# Patient Record
Sex: Male | Born: 1955 | Race: Black or African American | Hispanic: No | Marital: Single | Smoking: Former smoker
Health system: Southern US, Community
[De-identification: ages and names within clinical notes are randomized; demographics above are authoritative.]

## PROBLEM LIST (undated history)

## (undated) DIAGNOSIS — R51 Headache: Secondary | ICD-10-CM

## (undated) DIAGNOSIS — R0600 Dyspnea, unspecified: Secondary | ICD-10-CM

## (undated) DIAGNOSIS — K635 Polyp of colon: Secondary | ICD-10-CM

## (undated) DIAGNOSIS — E119 Type 2 diabetes mellitus without complications: Secondary | ICD-10-CM

## (undated) DIAGNOSIS — F329 Major depressive disorder, single episode, unspecified: Secondary | ICD-10-CM

## (undated) DIAGNOSIS — R350 Frequency of micturition: Secondary | ICD-10-CM

## (undated) DIAGNOSIS — R112 Nausea with vomiting, unspecified: Secondary | ICD-10-CM

## (undated) DIAGNOSIS — R351 Nocturia: Secondary | ICD-10-CM

## (undated) DIAGNOSIS — S31119A Laceration without foreign body of abdominal wall, unspecified quadrant without penetration into peritoneal cavity, initial encounter: Secondary | ICD-10-CM

## (undated) DIAGNOSIS — F32A Depression, unspecified: Secondary | ICD-10-CM

## (undated) DIAGNOSIS — R519 Headache, unspecified: Secondary | ICD-10-CM

## (undated) DIAGNOSIS — J189 Pneumonia, unspecified organism: Secondary | ICD-10-CM

## (undated) DIAGNOSIS — U071 COVID-19: Secondary | ICD-10-CM

## (undated) DIAGNOSIS — R7303 Prediabetes: Secondary | ICD-10-CM

## (undated) DIAGNOSIS — M5020 Other cervical disc displacement, unspecified cervical region: Secondary | ICD-10-CM

## (undated) DIAGNOSIS — K122 Cellulitis and abscess of mouth: Secondary | ICD-10-CM

## (undated) DIAGNOSIS — K219 Gastro-esophageal reflux disease without esophagitis: Secondary | ICD-10-CM

## (undated) DIAGNOSIS — I10 Essential (primary) hypertension: Secondary | ICD-10-CM

## (undated) DIAGNOSIS — Z8709 Personal history of other diseases of the respiratory system: Secondary | ICD-10-CM

## (undated) DIAGNOSIS — R413 Other amnesia: Secondary | ICD-10-CM

## (undated) DIAGNOSIS — R202 Paresthesia of skin: Secondary | ICD-10-CM

## (undated) DIAGNOSIS — F419 Anxiety disorder, unspecified: Secondary | ICD-10-CM

## (undated) DIAGNOSIS — K439 Ventral hernia without obstruction or gangrene: Secondary | ICD-10-CM

## (undated) DIAGNOSIS — H538 Other visual disturbances: Secondary | ICD-10-CM

## (undated) DIAGNOSIS — M1990 Unspecified osteoarthritis, unspecified site: Secondary | ICD-10-CM

## (undated) DIAGNOSIS — R200 Anesthesia of skin: Secondary | ICD-10-CM

## (undated) DIAGNOSIS — E669 Obesity, unspecified: Secondary | ICD-10-CM

## (undated) DIAGNOSIS — Z800 Family history of malignant neoplasm of digestive organs: Secondary | ICD-10-CM

## (undated) HISTORY — PX: NERVE REPAIR: SHX2083

## (undated) HISTORY — DX: Gastro-esophageal reflux disease without esophagitis: K21.9

## (undated) HISTORY — PX: OTHER SURGICAL HISTORY: SHX169

## (undated) HISTORY — DX: Polyp of colon: K63.5

## (undated) HISTORY — PX: COLONOSCOPY: SHX174

## (undated) HISTORY — PX: LEG SURGERY: SHX1003

## (undated) HISTORY — DX: Unspecified osteoarthritis, unspecified site: M19.90

## (undated) HISTORY — PX: APPENDECTOMY: SHX54

## (undated) HISTORY — DX: Headache, unspecified: R51.9

## (undated) HISTORY — PX: POLYPECTOMY: SHX149

## (undated) HISTORY — DX: Cellulitis and abscess of mouth: K12.2

## (undated) HISTORY — DX: Headache: R51

## (undated) HISTORY — PX: SHOULDER ARTHROSCOPY: SHX128

## (undated) HISTORY — DX: Other amnesia: R41.3

## (undated) HISTORY — DX: Nausea with vomiting, unspecified: R11.2

## (undated) HISTORY — DX: Family history of malignant neoplasm of digestive organs: Z80.0

## (undated) HISTORY — DX: Ventral hernia without obstruction or gangrene: K43.9

---

## 1986-12-17 HISTORY — PX: LAPAROTOMY: SHX154

## 1994-12-17 HISTORY — PX: HERNIA REPAIR: SHX51

## 1998-11-24 ENCOUNTER — Emergency Department (HOSPITAL_COMMUNITY): Admission: EM | Admit: 1998-11-24 | Discharge: 1998-11-24 | Payer: Self-pay | Admitting: Emergency Medicine

## 1998-12-13 ENCOUNTER — Emergency Department (HOSPITAL_COMMUNITY): Admission: EM | Admit: 1998-12-13 | Discharge: 1998-12-13 | Payer: Self-pay | Admitting: Emergency Medicine

## 1998-12-13 ENCOUNTER — Encounter: Payer: Self-pay | Admitting: Emergency Medicine

## 2000-05-09 ENCOUNTER — Emergency Department (HOSPITAL_COMMUNITY): Admission: EM | Admit: 2000-05-09 | Discharge: 2000-05-09 | Payer: Self-pay | Admitting: Emergency Medicine

## 2001-02-10 ENCOUNTER — Emergency Department (HOSPITAL_COMMUNITY): Admission: EM | Admit: 2001-02-10 | Discharge: 2001-02-10 | Payer: Self-pay | Admitting: Emergency Medicine

## 2001-02-10 ENCOUNTER — Encounter: Payer: Self-pay | Admitting: Emergency Medicine

## 2003-06-16 ENCOUNTER — Emergency Department (HOSPITAL_COMMUNITY): Admission: EM | Admit: 2003-06-16 | Discharge: 2003-06-16 | Payer: Self-pay | Admitting: Emergency Medicine

## 2003-06-16 ENCOUNTER — Encounter: Payer: Self-pay | Admitting: Emergency Medicine

## 2004-07-16 ENCOUNTER — Emergency Department (HOSPITAL_COMMUNITY): Admission: EM | Admit: 2004-07-16 | Discharge: 2004-07-16 | Payer: Self-pay | Admitting: Emergency Medicine

## 2004-08-15 ENCOUNTER — Emergency Department (HOSPITAL_COMMUNITY): Admission: EM | Admit: 2004-08-15 | Discharge: 2004-08-16 | Payer: Self-pay | Admitting: Emergency Medicine

## 2004-08-27 ENCOUNTER — Emergency Department (HOSPITAL_COMMUNITY): Admission: EM | Admit: 2004-08-27 | Discharge: 2004-08-27 | Payer: Self-pay | Admitting: Emergency Medicine

## 2004-08-30 ENCOUNTER — Emergency Department (HOSPITAL_COMMUNITY): Admission: EM | Admit: 2004-08-30 | Discharge: 2004-08-30 | Payer: Self-pay | Admitting: Emergency Medicine

## 2004-10-08 ENCOUNTER — Emergency Department (HOSPITAL_COMMUNITY): Admission: EM | Admit: 2004-10-08 | Discharge: 2004-10-08 | Payer: Self-pay | Admitting: Emergency Medicine

## 2006-01-02 ENCOUNTER — Emergency Department (HOSPITAL_COMMUNITY): Admission: EM | Admit: 2006-01-02 | Discharge: 2006-01-02 | Payer: Self-pay | Admitting: Emergency Medicine

## 2007-06-14 ENCOUNTER — Emergency Department (HOSPITAL_COMMUNITY): Admission: EM | Admit: 2007-06-14 | Discharge: 2007-06-15 | Payer: Self-pay | Admitting: Emergency Medicine

## 2007-08-14 ENCOUNTER — Emergency Department (HOSPITAL_COMMUNITY): Admission: EM | Admit: 2007-08-14 | Discharge: 2007-08-15 | Payer: Self-pay | Admitting: Family Medicine

## 2007-08-19 ENCOUNTER — Emergency Department (HOSPITAL_COMMUNITY): Admission: EM | Admit: 2007-08-19 | Discharge: 2007-08-19 | Payer: Self-pay | Admitting: Emergency Medicine

## 2007-08-22 ENCOUNTER — Ambulatory Visit: Payer: Self-pay | Admitting: Family Medicine

## 2007-08-25 ENCOUNTER — Ambulatory Visit: Payer: Self-pay | Admitting: Family Medicine

## 2007-09-01 ENCOUNTER — Emergency Department (HOSPITAL_COMMUNITY): Admission: EM | Admit: 2007-09-01 | Discharge: 2007-09-01 | Payer: Self-pay | Admitting: Emergency Medicine

## 2007-09-03 ENCOUNTER — Ambulatory Visit: Payer: Self-pay | Admitting: Family Medicine

## 2007-11-21 ENCOUNTER — Emergency Department (HOSPITAL_COMMUNITY): Admission: EM | Admit: 2007-11-21 | Discharge: 2007-11-21 | Payer: Self-pay | Admitting: Emergency Medicine

## 2008-01-01 ENCOUNTER — Ambulatory Visit (HOSPITAL_COMMUNITY): Admission: RE | Admit: 2008-01-01 | Discharge: 2008-01-02 | Payer: Self-pay | Admitting: Specialist

## 2008-04-13 ENCOUNTER — Encounter: Admission: RE | Admit: 2008-04-13 | Discharge: 2008-04-13 | Payer: Self-pay | Admitting: Neurology

## 2008-09-30 ENCOUNTER — Emergency Department (HOSPITAL_COMMUNITY): Admission: EM | Admit: 2008-09-30 | Discharge: 2008-09-30 | Payer: Self-pay | Admitting: Emergency Medicine

## 2009-07-25 ENCOUNTER — Emergency Department (HOSPITAL_COMMUNITY): Admission: EM | Admit: 2009-07-25 | Discharge: 2009-07-25 | Payer: Self-pay | Admitting: Emergency Medicine

## 2010-04-11 ENCOUNTER — Emergency Department (HOSPITAL_COMMUNITY): Admission: EM | Admit: 2010-04-11 | Discharge: 2010-04-12 | Payer: Self-pay | Admitting: Emergency Medicine

## 2010-10-04 ENCOUNTER — Emergency Department (HOSPITAL_COMMUNITY)
Admission: EM | Admit: 2010-10-04 | Discharge: 2010-10-04 | Payer: Self-pay | Source: Home / Self Care | Admitting: Emergency Medicine

## 2011-03-06 LAB — URINALYSIS, ROUTINE W REFLEX MICROSCOPIC
Bilirubin Urine: NEGATIVE
Glucose, UA: NEGATIVE mg/dL
Hgb urine dipstick: NEGATIVE
Protein, ur: NEGATIVE mg/dL
Specific Gravity, Urine: 1.028 (ref 1.005–1.030)

## 2011-03-06 LAB — COMPREHENSIVE METABOLIC PANEL
ALT: 26 U/L (ref 0–53)
AST: 26 U/L (ref 0–37)
Albumin: 4.1 g/dL (ref 3.5–5.2)
Alkaline Phosphatase: 75 U/L (ref 39–117)
BUN: 16 mg/dL (ref 6–23)
Potassium: 3.6 mEq/L (ref 3.5–5.1)
Total Bilirubin: 0.3 mg/dL (ref 0.3–1.2)

## 2011-03-06 LAB — TROPONIN I: Troponin I: 0.02 ng/mL (ref 0.00–0.06)

## 2011-03-06 LAB — DIFFERENTIAL
Basophils Absolute: 0 10*3/uL (ref 0.0–0.1)
Basophils Relative: 1 % (ref 0–1)
Eosinophils Absolute: 0.4 10*3/uL (ref 0.0–0.7)
Eosinophils Relative: 5 % (ref 0–5)
Neutro Abs: 4.7 10*3/uL (ref 1.7–7.7)

## 2011-03-06 LAB — CK TOTAL AND CKMB (NOT AT ARMC)
Relative Index: 0.6 (ref 0.0–2.5)
Relative Index: 0.7 (ref 0.0–2.5)

## 2011-03-06 LAB — CBC
HCT: 44.6 % (ref 39.0–52.0)
MCV: 82.5 fL (ref 78.0–100.0)

## 2011-03-06 LAB — LIPASE, BLOOD: Lipase: 23 U/L (ref 11–59)

## 2011-03-24 LAB — COMPREHENSIVE METABOLIC PANEL
AST: 23 U/L (ref 0–37)
Albumin: 4.1 g/dL (ref 3.5–5.2)
Calcium: 9.4 mg/dL (ref 8.4–10.5)
Creatinine, Ser: 1.28 mg/dL (ref 0.4–1.5)
GFR calc Af Amer: >60 mL/min (ref >60)
GFR calc non Af Amer: 59 mL/min — ABNORMAL LOW (ref >60)
Total Protein: 7.3 g/dL (ref 6.0–8.3)

## 2011-03-24 LAB — URINALYSIS, ROUTINE W REFLEX MICROSCOPIC
Glucose, UA: NEGATIVE mg/dL
Hgb urine dipstick: NEGATIVE
Ketones, ur: NEGATIVE mg/dL
Nitrite: NEGATIVE
Protein, ur: NEGATIVE mg/dL
Urobilinogen, UA: 0.2 mg/dL (ref 0.0–1.0)

## 2011-03-24 LAB — DIFFERENTIAL
Eosinophils Relative: 1 % (ref 0–5)
Lymphocytes Relative: 25 % (ref 12–46)
Lymphs Abs: 1.6 10*3/uL (ref 0.7–4.0)
Monocytes Absolute: 0.5 10*3/uL (ref 0.1–1.0)

## 2011-03-24 LAB — CBC
MCHC: 33.3 g/dL (ref 30.0–36.0)
MCV: 84.4 fL (ref 78.0–100.0)
Platelets: 179 10*3/uL (ref 150–400)
RDW: 13.2 % (ref 11.5–15.5)

## 2011-05-01 NOTE — Op Note (Signed)
NAMEKOBYN, KRAY NO.:  [id number]   MEDICAL RECORD NO.:  [id number]          PATIENT TYPE:  OIB   LOCATION:  1616                         FACILITY:  [facility]   PHYSICIAN:  Jene Every, M.D.    DATE OF BIRTH:  04-06-1956   DATE OF PROCEDURE:  01/01/2008  DATE OF DISCHARGE:                               OPERATIVE REPORT   PREOPERATIVE. DIAGNOSIS:  Impingement syndrome, right shoulder.   POSTOPERATIVE DIAGNOSES:  1. Impingement syndrome, right shoulder,  2. Glenoid labral tear.   PROCEDURES PERFORMED:  Right shoulder arthroscopy, and debridement of  glenoid labral tear in glenohumeral joint, followed by a subacromial  decompression, bursectomy and acromioplasty.   ANESTHESIA:  General.   ASSISTANT:  Roma Schanz, P.A.   BRIEF HISTORY AND INDICATION:  The patient in this gentleman is a 55-  year-old with refractory shoulder pain following a work-related injury,  MRI indicating bursitis and an os acromiale.  No evidence of tear.  Persistence of pain despite subacromial corticosteroid injection and  physical therapy, is indicated for a shoulder arthroscopy and  debridement.  Risks and benefits discussed including infection, damage  to vascular structures, no change in symptoms, worsening symptoms, the  need for repeat debridement, anesthetic complications, etc.   TECHNIQUE:  The patient in supine beach-chair position.  After induction  of adequate anesthesia and 2 g of Kefzol, the right shoulder and upper  extremity is prepped and draped in the usual sterile fashion.  A  surgical marker was utilized delineate the acromion, the AC joint and  coracoid.  A standard anterolateral incision.  The anterior incision was  made through the skin only.  With the arm at the 7:30 position, we  advanced the cannula into the glenohumeral joint, penetrating it  atraumatically.  I advanced the anterior cannula beneath the biceps  tendon into the joint as well.  Noted  was a tear of the anterior labrum,  not detached.  The biceps was intact.  Collene Mares is introduced and utilized  to debride the labrum.  Mild degenerative changes of the glenoid.  The  humeral head was unremarkable.  The rotator cuff was without evidence of  tear.  The subscap was unremarkable.   The camera was then redirected in the subacromial space and then the  cannula into the lateral space.  Exuberant hypertrophic bursa was noted.  Collene Mares was the introduced and utilized to perform a full bursectomy.  The rotator cuff was examined and there was found to be no evidence of  tear.  The CA ligament was attached and with the ArthroWand and a small  anterolateral spur was removed with an acromionizer.  Good range was  noted following this.  There was no excessive instability of the os  acromiale.  Again no evidence of a tear.  Next all instrumentation was  removed.  Portals were closed with 4-0 nylon simple sutures, 0.25%  Marcaine with epinephrine was infiltrated  into the joint.  The wound was dressed sterilely.  He was awoken without  difficulty and transported to the recovery room after a sling was  applied  in satisfactory condition.   The patient tolerated the procedure well with no complications.      Jene Every, M.D.  Electronically Signed     JB/MEDQ  D:  01/01/2008  T:  01/02/2008  Job:  [id number]

## 2011-08-07 ENCOUNTER — Emergency Department (HOSPITAL_COMMUNITY)
Admission: EM | Admit: 2011-08-07 | Discharge: 2011-08-07 | Disposition: A | Discharge status: Home / Self Care | Payer: Self-pay | Attending: Emergency Medicine | Admitting: Emergency Medicine

## 2011-08-07 DIAGNOSIS — K089 Disorder of teeth and supporting structures, unspecified: Secondary | ICD-10-CM | POA: Insufficient documentation

## 2011-08-07 DIAGNOSIS — K029 Dental caries, unspecified: Secondary | ICD-10-CM | POA: Insufficient documentation

## 2011-08-07 DIAGNOSIS — J45909 Unspecified asthma, uncomplicated: Secondary | ICD-10-CM | POA: Insufficient documentation

## 2011-08-07 DIAGNOSIS — K047 Periapical abscess without sinus: Secondary | ICD-10-CM | POA: Insufficient documentation

## 2011-09-04 ENCOUNTER — Other Ambulatory Visit (INDEPENDENT_AMBULATORY_CARE_PROVIDER_SITE_OTHER): Payer: Self-pay | Admitting: General Surgery

## 2011-09-04 ENCOUNTER — Emergency Department (HOSPITAL_COMMUNITY): Payer: Self-pay

## 2011-09-04 ENCOUNTER — Ambulatory Visit (HOSPITAL_COMMUNITY)
Admission: EM | Admit: 2011-09-04 | Discharge: 2011-09-06 | Disposition: A | Discharge status: Home / Self Care | Payer: Self-pay | Attending: General Surgery | Admitting: General Surgery

## 2011-09-04 ENCOUNTER — Encounter (HOSPITAL_COMMUNITY): Payer: Self-pay

## 2011-09-04 DIAGNOSIS — G8929 Other chronic pain: Secondary | ICD-10-CM | POA: Insufficient documentation

## 2011-09-04 DIAGNOSIS — K3580 Unspecified acute appendicitis: Secondary | ICD-10-CM | POA: Insufficient documentation

## 2011-09-04 DIAGNOSIS — R109 Unspecified abdominal pain: Secondary | ICD-10-CM | POA: Insufficient documentation

## 2011-09-04 DIAGNOSIS — R112 Nausea with vomiting, unspecified: Secondary | ICD-10-CM

## 2011-09-04 DIAGNOSIS — R1031 Right lower quadrant pain: Secondary | ICD-10-CM

## 2011-09-04 DIAGNOSIS — R51 Headache: Secondary | ICD-10-CM | POA: Insufficient documentation

## 2011-09-04 DIAGNOSIS — F17200 Nicotine dependence, unspecified, uncomplicated: Secondary | ICD-10-CM | POA: Insufficient documentation

## 2011-09-04 DIAGNOSIS — K436 Other and unspecified ventral hernia with obstruction, without gangrene: Secondary | ICD-10-CM | POA: Insufficient documentation

## 2011-09-04 LAB — URINE MICROSCOPIC-ADD ON

## 2011-09-04 LAB — DIFFERENTIAL
Basophils Absolute: 0 10*3/uL (ref 0.0–0.1)
Basophils Relative: 0 % (ref 0–1)
Eosinophils Relative: 0 % (ref 0–5)
Monocytes Absolute: 0.6 10*3/uL (ref 0.1–1.0)
Neutro Abs: 11.3 10*3/uL — ABNORMAL HIGH (ref 1.7–7.7)

## 2011-09-04 LAB — URINALYSIS, ROUTINE W REFLEX MICROSCOPIC
Leukocytes, UA: NEGATIVE
Protein, ur: 30 mg/dL — AB
Urobilinogen, UA: 0.2 mg/dL (ref 0.0–1.0)

## 2011-09-04 LAB — COMPREHENSIVE METABOLIC PANEL
ALT: 19 U/L (ref 0–53)
Alkaline Phosphatase: 78 U/L (ref 39–117)
CO2: 24 mEq/L (ref 19–32)
Calcium: 9.4 mg/dL (ref 8.4–10.5)
GFR calc Af Amer: >60 mL/min (ref >60)
GFR calc non Af Amer: >60 mL/min (ref >60)
Glucose, Bld: 124 mg/dL — ABNORMAL HIGH (ref 70–99)
Sodium: 137 mEq/L (ref 135–145)

## 2011-09-04 LAB — CBC
MCHC: 33.3 g/dL (ref 30.0–36.0)
Platelets: 165 10*3/uL (ref 150–400)
RDW: 13 % (ref 11.5–15.5)

## 2011-09-04 MED ORDER — IOHEXOL 300 MG/ML  SOLN
100.0000 mL | Freq: Once | INTRAMUSCULAR | Status: AC | PRN
  Administered 2011-09-04: 100 mL via INTRAVENOUS
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
## 2011-09-06 LAB — CBC
HCT: 46
Hemoglobin: 15.8
MCHC: 34.4
RDW: 12.6

## 2011-09-06 LAB — DIFFERENTIAL
Basophils Relative: 1
Monocytes Relative: 8
Neutro Abs: 4.4
Neutrophils Relative %: 66

## 2011-09-06 LAB — COMPREHENSIVE METABOLIC PANEL
Alkaline Phosphatase: 69
BUN: 10
CO2: 28
Calcium: 10
GFR calc non Af Amer: >60
Glucose, Bld: 102 — ABNORMAL HIGH
Potassium: 4.4
Total Protein: 7.5

## 2011-09-22 NOTE — Discharge Summary (Signed)
Kenneth Davila, Kenneth Davila NO.:  [id number]  MEDICAL RECORD NO.:  [id number]  LOCATION:  1523                         FACILITY:  [facility]  PHYSICIAN:  Angelia Mould. Derrell Lolling, M.D.DATE OF BIRTH:  [birth year]  DATE OF ADMISSION:  09/04/2011 DATE OF DISCHARGE:  09/06/2011                              DISCHARGE SUMMARY   ADMISSION DIAGNOSIS:  Ruptured appendicitis.  DISCHARGE DIAGNOSIS:  Gangrenous ruptured appendicitis with localized peritonitis.  BRIEF HISTORY:  The patient is a 55 year old gentleman, who has had pain for the last 3 days.  It started Sunday evening.  He came home and had some pudding, apparently developed nausea and vomiting.  He had ongoing pain, was unable to eat throughout the day on Monday.  He eventually called EMS, was taken to the ER at [facility] where he was evaluated by the ER staff.  He also had an elevated white count of 12,500.  Other labs were normal.  CT scan showed an appendicolith with dilatation of the appendix up to 1.8 cm.  There was periappendiceal inflammation and locules of enteric gas.  There was a fluid collection just inferior to the appendix measuring 2.8 cm and a collection was previously present on exam in 2007, but he has new inflammatory changes.  He was seen in the ER by Dr. Derrell Lolling and Dr. Gerrit Friends and subsequently admitted for appendicitis.  PAST MEDICAL HISTORY:  Please see the dictated note.  SOCIAL HISTORY:  Please see the dictated note.  HOSPITAL COURSE:  The patient was admitted.  He is placed on IV antibiotics and taken to the operating room later that day.  In the OR he underwent laparoscopic appendectomy.  It was gangrenous and it was ruptured with a localized peritonitis.  The patient tolerated the procedure well and returned to the floor in satisfactory condition.  On the first postoperative day, he was stable.  We continued him on antibiotics.  He was seen the second postoperative day, he was tolerating p.o.'s  well.  His T-max was 99.1.  His incisions looked good. He had good bowel function and it was Dr. Jacinto Halim opinion that he could be discharged home on 7 more days of oral antibiotics.  DISCHARGE MEDICATIONS: 1. Hydrocodone/APAP 5/325 one to two q.4 h. p.r.n. for pain. 2. Ciprofloxacin 500 mg p.o. b.i.d. 3. Flagyl 500 mg p.o. q.8 h. 4. He may also resume his home Tylenol plain or home Tylenol with     phenylephrine combination for congestion.  DISCHARGE ACTIVITY:  Light to moderate.  He is not to lift over 15 pounds for 2 weeks.  He can clean his wounds with plain soap and water. He has discharge instructions, which include:  Call in for fever of 101. Call in for increased abdominal pain, bleeding or redness, pain or drainage from his incision, or inability to urinate.  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  We will see him back in 2 weeks.  He is to call the office for a followup with Dr. Derrell Lolling in 2 to 3 weeks.  CONDITION ON DISCHARGE:  Improved.  Eber Hong, P.A.   ______________________________ Angelia Mould. Derrell Lolling, M.D.    WDJ/MEDQ  D:  09/06/2011  T:  09/06/2011  Job:  [id number]  Electronically Signed by Sherrie George P.A. on 09/13/2011 09:38:48 PM Electronically Signed by Claud Kelp M.D. on 09/22/2011 03:58:00 PM
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
## 2011-09-28 LAB — RAPID URINE DRUG SCREEN, HOSP PERFORMED
Barbiturates: NOT DETECTED
Cocaine: NOT DETECTED
Opiates: NOT DETECTED
Tetrahydrocannabinol: NOT DETECTED

## 2011-10-01 ENCOUNTER — Emergency Department (HOSPITAL_COMMUNITY)
Admission: EM | Admit: 2011-10-01 | Discharge: 2011-10-01 | Disposition: A | Discharge status: Home / Self Care | Payer: Self-pay | Attending: Internal Medicine | Admitting: Internal Medicine

## 2011-10-01 DIAGNOSIS — R109 Unspecified abdominal pain: Secondary | ICD-10-CM | POA: Insufficient documentation

## 2011-10-01 DIAGNOSIS — R197 Diarrhea, unspecified: Secondary | ICD-10-CM | POA: Insufficient documentation

## 2011-10-01 DIAGNOSIS — K439 Ventral hernia without obstruction or gangrene: Secondary | ICD-10-CM | POA: Insufficient documentation

## 2011-10-01 DIAGNOSIS — Z87828 Personal history of other (healed) physical injury and trauma: Secondary | ICD-10-CM | POA: Insufficient documentation

## 2011-10-01 DIAGNOSIS — Z9089 Acquired absence of other organs: Secondary | ICD-10-CM | POA: Insufficient documentation

## 2011-10-01 LAB — DIFFERENTIAL
Eosinophils Absolute: 0.1 10*3/uL (ref 0.0–0.7)
Eosinophils Relative: 2 % (ref 0–5)
Lymphocytes Relative: 21 % (ref 12–46)
Lymphs Abs: 1.6 10*3/uL (ref 0.7–4.0)
Monocytes Absolute: 0.6 10*3/uL (ref 0.1–1.0)
Monocytes Relative: 8 % (ref 3–12)

## 2011-10-01 LAB — COMPREHENSIVE METABOLIC PANEL
AST: 14 U/L (ref 0–37)
Albumin: 3.6 g/dL (ref 3.5–5.2)
BUN: 7 mg/dL (ref 6–23)
Calcium: 9.3 mg/dL (ref 8.4–10.5)
Creatinine, Ser: 1.01 mg/dL (ref 0.50–1.35)
Total Bilirubin: 0.3 mg/dL (ref 0.3–1.2)

## 2011-10-01 LAB — CBC
HCT: 42.8 % (ref 39.0–52.0)
MCH: 26.7 pg (ref 26.0–34.0)
MCHC: 32.7 g/dL (ref 30.0–36.0)
MCV: 81.5 fL (ref 78.0–100.0)
Platelets: 230 10*3/uL (ref 150–400)
RDW: 13 % (ref 11.5–15.5)

## 2011-10-01 LAB — LIPASE, BLOOD: Lipase: 16 U/L (ref 11–59)

## 2011-10-01 LAB — CLOSTRIDIUM DIFFICILE BY PCR: Toxigenic C. Difficile by PCR: POSITIVE — AB

## 2011-10-04 ENCOUNTER — Encounter (INDEPENDENT_AMBULATORY_CARE_PROVIDER_SITE_OTHER): Payer: Self-pay | Admitting: General Surgery

## 2011-10-05 ENCOUNTER — Encounter (INDEPENDENT_AMBULATORY_CARE_PROVIDER_SITE_OTHER): Payer: Self-pay | Admitting: General Surgery

## 2011-10-08 ENCOUNTER — Encounter (INDEPENDENT_AMBULATORY_CARE_PROVIDER_SITE_OTHER): Payer: Self-pay | Admitting: General Surgery

## 2011-10-11 ENCOUNTER — Encounter (INDEPENDENT_AMBULATORY_CARE_PROVIDER_SITE_OTHER): Payer: Self-pay | Admitting: General Surgery

## 2011-12-18 ENCOUNTER — Emergency Department (HOSPITAL_COMMUNITY)
Admission: EM | Admit: 2011-12-18 | Discharge: 2011-12-18 | Disposition: A | Discharge status: Home / Self Care | Payer: Self-pay | Attending: Emergency Medicine | Admitting: Emergency Medicine

## 2011-12-18 ENCOUNTER — Encounter (HOSPITAL_COMMUNITY): Payer: Self-pay | Admitting: Nurse Practitioner

## 2011-12-18 ENCOUNTER — Emergency Department (HOSPITAL_COMMUNITY): Payer: Self-pay

## 2011-12-18 DIAGNOSIS — M129 Arthropathy, unspecified: Secondary | ICD-10-CM | POA: Insufficient documentation

## 2011-12-18 DIAGNOSIS — R221 Localized swelling, mass and lump, neck: Secondary | ICD-10-CM | POA: Insufficient documentation

## 2011-12-18 DIAGNOSIS — R220 Localized swelling, mass and lump, head: Secondary | ICD-10-CM | POA: Insufficient documentation

## 2011-12-18 DIAGNOSIS — K219 Gastro-esophageal reflux disease without esophagitis: Secondary | ICD-10-CM | POA: Insufficient documentation

## 2011-12-18 DIAGNOSIS — K047 Periapical abscess without sinus: Secondary | ICD-10-CM | POA: Insufficient documentation

## 2011-12-18 DIAGNOSIS — J45909 Unspecified asthma, uncomplicated: Secondary | ICD-10-CM | POA: Insufficient documentation

## 2011-12-18 DIAGNOSIS — K089 Disorder of teeth and supporting structures, unspecified: Secondary | ICD-10-CM | POA: Insufficient documentation

## 2011-12-18 LAB — DIFFERENTIAL
Basophils Relative: 0 % (ref 0–1)
Eosinophils Absolute: 0.1 10*3/uL (ref 0.0–0.7)
Eosinophils Relative: 1 % (ref 0–5)
Monocytes Absolute: 0.8 10*3/uL (ref 0.1–1.0)
Monocytes Relative: 8 % (ref 3–12)
Neutro Abs: 7.2 10*3/uL (ref 1.7–7.7)

## 2011-12-18 LAB — CBC
HCT: 45.7 % (ref 39.0–52.0)
Hemoglobin: 15.5 g/dL (ref 13.0–17.0)
MCH: 27.4 pg (ref 26.0–34.0)
MCHC: 33.9 g/dL (ref 30.0–36.0)
MCV: 80.9 fL (ref 78.0–100.0)

## 2011-12-18 MED ORDER — CLINDAMYCIN HCL 150 MG PO CAPS: 300.0000 mg | ORAL_CAPSULE | Freq: Four times a day (QID) | ORAL | Status: AC

## 2011-12-18 MED ORDER — OXYCODONE-ACETAMINOPHEN 5-325 MG PO TABS: 2.0000 | ORAL_TABLET | ORAL | Status: AC | PRN

## 2011-12-18 MED ORDER — SODIUM CHLORIDE 0.9 % IV BOLUS (SEPSIS)
500.0000 mL | Freq: Once | INTRAVENOUS | Status: AC
  Administered 2011-12-18: 500 mL via INTRAVENOUS

## 2011-12-18 MED ORDER — MORPHINE SULFATE 4 MG/ML IJ SOLN
4.0000 mg | Freq: Once | INTRAMUSCULAR | Status: AC
  Administered 2011-12-18: 4 mg via INTRAVENOUS
  Filled 2011-12-18: qty 1

## 2011-12-18 MED ORDER — IOHEXOL 300 MG/ML  SOLN
100.0000 mL | Freq: Once | INTRAMUSCULAR | Status: AC | PRN
  Administered 2011-12-18: 100 mL via INTRAVENOUS

## 2011-12-18 MED ORDER — CLINDAMYCIN PHOSPHATE 600 MG/50ML IV SOLN
600.0000 mg | Freq: Once | INTRAVENOUS | Status: AC
  Administered 2011-12-18: 600 mg via INTRAVENOUS
  Filled 2011-12-18: qty 50
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
## 2011-12-18 NOTE — ED Notes (Signed)
Patient transported to CT 

## 2011-12-18 NOTE — ED Notes (Signed)
Pt reports pain to upper front R tooth over past several days. Onset swelling today and worried about infection. Pain radiating into head now

## 2012-08-01 ENCOUNTER — Emergency Department (HOSPITAL_COMMUNITY): Payer: Self-pay

## 2012-08-01 ENCOUNTER — Encounter (HOSPITAL_COMMUNITY): Payer: Self-pay | Admitting: *Deleted

## 2012-08-01 ENCOUNTER — Emergency Department (HOSPITAL_COMMUNITY)
Admission: EM | Admit: 2012-08-01 | Discharge: 2012-08-01 | Disposition: A | Discharge status: Home / Self Care | Payer: Self-pay | Attending: Emergency Medicine | Admitting: Emergency Medicine

## 2012-08-01 ENCOUNTER — Other Ambulatory Visit: Payer: Self-pay

## 2012-08-01 DIAGNOSIS — J45909 Unspecified asthma, uncomplicated: Secondary | ICD-10-CM | POA: Insufficient documentation

## 2012-08-01 DIAGNOSIS — J40 Bronchitis, not specified as acute or chronic: Secondary | ICD-10-CM | POA: Insufficient documentation

## 2012-08-01 DIAGNOSIS — R0989 Other specified symptoms and signs involving the circulatory and respiratory systems: Secondary | ICD-10-CM | POA: Insufficient documentation

## 2012-08-01 DIAGNOSIS — R0609 Other forms of dyspnea: Secondary | ICD-10-CM | POA: Insufficient documentation

## 2012-08-01 DIAGNOSIS — R0602 Shortness of breath: Secondary | ICD-10-CM | POA: Insufficient documentation

## 2012-08-01 LAB — CBC WITH DIFFERENTIAL/PLATELET
Basophils Absolute: 0 10*3/uL (ref 0.0–0.1)
Basophils Relative: 1 % (ref 0–1)
Eosinophils Relative: 7 % — ABNORMAL HIGH (ref 0–5)
HCT: 39.2 % (ref 39.0–52.0)
MCHC: 34.2 g/dL (ref 30.0–36.0)
MCV: 81 fL (ref 78.0–100.0)
Monocytes Absolute: 0.4 10*3/uL (ref 0.1–1.0)
Neutro Abs: 2.1 10*3/uL (ref 1.7–7.7)
RDW: 13.5 % (ref 11.5–15.5)

## 2012-08-01 LAB — BASIC METABOLIC PANEL
BUN: 11 mg/dL (ref 6–23)
Calcium: 9.4 mg/dL (ref 8.4–10.5)
Chloride: 107 mEq/L (ref 96–112)
Creatinine, Ser: 1.2 mg/dL (ref 0.50–1.35)
GFR calc Af Amer: 78 mL/min — ABNORMAL LOW (ref >90)

## 2012-08-01 MED ORDER — IPRATROPIUM BROMIDE 0.02 % IN SOLN
0.5000 mg | Freq: Once | RESPIRATORY_TRACT | Status: AC
  Administered 2012-08-01: 0.5 mg via RESPIRATORY_TRACT
  Filled 2012-08-01: qty 2.5

## 2012-08-01 MED ORDER — ALBUTEROL SULFATE (5 MG/ML) 0.5% IN NEBU
2.5000 mg | INHALATION_SOLUTION | RESPIRATORY_TRACT | Status: AC
  Administered 2012-08-01: 2.5 mg via RESPIRATORY_TRACT
  Filled 2012-08-01: qty 0.5

## 2012-08-01 MED ORDER — ALBUTEROL SULFATE HFA 108 (90 BASE) MCG/ACT IN AERS
2.0000 | INHALATION_SPRAY | RESPIRATORY_TRACT | Status: DC | PRN
  Administered 2012-08-01: 2 via RESPIRATORY_TRACT
  Filled 2012-08-01: qty 6.7

## 2012-08-01 MED ORDER — ASPIRIN 81 MG PO CHEW
324.0000 mg | CHEWABLE_TABLET | Freq: Once | ORAL | Status: AC
  Administered 2012-08-01: 324 mg via ORAL
  Filled 2012-08-01: qty 4

## 2012-08-01 MED ORDER — PREDNISONE 20 MG PO TABS: 60.0000 mg | ORAL_TABLET | Freq: Every day | ORAL | Status: DC

## 2012-08-01 MED ORDER — METHYLPREDNISOLONE SODIUM SUCC 125 MG IJ SOLR
125.0000 mg | Freq: Once | INTRAMUSCULAR | Status: AC
  Administered 2012-08-01: 125 mg via INTRAVENOUS
  Filled 2012-08-01: qty 2
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
## 2012-08-01 NOTE — ED Notes (Signed)
Pt. Reports increased SOB for over one month. Pt. reports that it has gotten harder and harder for him to breath and do daily chores.  Pt. Reports that he has started to hear himself breath and last night he "was doing everything to pull in oxygen but couldn't get air in."  Pt. Is talking as if he is short winded.  Pt. has c/o left sided chest tightness and abdominal tightness.  Pt. reports that today is "his birthday and if he was going to die he wanted to die at home."

## 2012-08-01 NOTE — ED Notes (Signed)
Pt reports couple day hx of increasing sob, states last night he felt if he went to sleep he wouldn't wake up. Denies any specific chest pain. Denies swelling to lower extremities. Has not hx of asthma or copd.

## 2012-08-01 NOTE — ED Notes (Signed)
Pt placed on monitor with continuous blood pressure and pulse oximetry 

## 2013-09-10 ENCOUNTER — Encounter: Payer: Self-pay | Admitting: Family Medicine

## 2013-09-10 ENCOUNTER — Ambulatory Visit: Payer: Self-pay | Attending: Family Medicine | Admitting: Family Medicine

## 2013-09-10 VITALS — BP 135/74 | HR 71 | Temp 98.9°F | Resp 18 | Ht 72.44 in | Wt 294.0 lb

## 2013-09-10 DIAGNOSIS — K439 Ventral hernia without obstruction or gangrene: Secondary | ICD-10-CM | POA: Insufficient documentation

## 2013-09-10 DIAGNOSIS — M25569 Pain in unspecified knee: Secondary | ICD-10-CM | POA: Insufficient documentation

## 2013-09-10 DIAGNOSIS — K219 Gastro-esophageal reflux disease without esophagitis: Secondary | ICD-10-CM

## 2013-09-10 DIAGNOSIS — Z8659 Personal history of other mental and behavioral disorders: Secondary | ICD-10-CM

## 2013-09-10 DIAGNOSIS — G894 Chronic pain syndrome: Secondary | ICD-10-CM | POA: Insufficient documentation

## 2013-09-10 DIAGNOSIS — M25561 Pain in right knee: Secondary | ICD-10-CM

## 2013-09-10 LAB — COMPLETE METABOLIC PANEL WITH GFR
ALT: 22 U/L (ref 0–53)
AST: 24 U/L (ref 0–37)
Creat: 1.16 mg/dL (ref 0.50–1.35)
Total Bilirubin: 0.4 mg/dL (ref 0.3–1.2)

## 2013-09-10 LAB — LIPID PANEL
HDL: 42 mg/dL (ref >39)
LDL Cholesterol: 121 mg/dL — ABNORMAL HIGH (ref 0–99)
Triglycerides: 202 mg/dL — ABNORMAL HIGH (ref <150)
VLDL: 40 mg/dL (ref 0–40)

## 2013-09-10 LAB — CBC
MCH: 27 pg (ref 26.0–34.0)
MCV: 78.6 fL (ref 78.0–100.0)
Platelets: 202 10*3/uL (ref 150–400)
RDW: 14.3 % (ref 11.5–15.5)

## 2013-09-10 MED ORDER — IBUPROFEN 800 MG PO TABS: 800.0000 mg | ORAL_TABLET | Freq: Three times a day (TID) | ORAL | Status: DC | PRN

## 2013-09-10 MED ORDER — TRAMADOL HCL 50 MG PO TABS: 50.0000 mg | ORAL_TABLET | Freq: Three times a day (TID) | ORAL | Status: DC | PRN
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
## 2013-09-10 NOTE — Patient Instructions (Signed)
Depression, Adult Depression refers to feeling sad, low, down in the dumps, blue, gloomy, or empty. In general, there are two kinds of depression: 1. Depression that we all experience from time to time because of upsetting life experiences, including the loss of a job or the ending of a relationship (normal sadness or normal grief). This kind of depression is considered normal, is short lived, and resolves within a few days to 2 weeks. (Depression experienced after the loss of a loved one is called bereavement. Bereavement often lasts longer than 2 weeks but normally gets better with time.) 2. Clinical depression, which lasts longer than normal sadness or normal grief or interferes with your ability to function at home, at work, and in school. It also interferes with your personal relationships. It affects almost every aspect of your life. Clinical depression is an illness. Symptoms of depression also can be caused by conditions other than normal sadness and grief or clinical depression. Examples of these conditions are listed as follows:  Physical illness Some physical illnesses, including underactive thyroid gland (hypothyroidism), severe anemia, specific types of cancer, diabetes, uncontrolled seizures, heart and lung problems, strokes, and chronic pain are commonly associated with symptoms of depression.  Side effects of some prescription medicine In some people, certain types of prescription medicine can cause symptoms of depression.  Substance abuse Abuse of alcohol and illicit drugs can cause symptoms of depression. SYMPTOMS Symptoms of normal sadness and normal grief include the following:  Feeling sad or crying for short periods of time.  Not caring about anything (apathy).  Difficulty sleeping or sleeping too much.  No longer able to enjoy the things you used to enjoy.  Desire to be by oneself all the time (social isolation).  Lack of energy or motivation.  Difficulty  concentrating or remembering.  Change in appetite or weight.  Restlessness or agitation. Symptoms of clinical depression include the same symptoms of normal sadness or normal grief and also the following symptoms:  Feeling sad or crying all the time.  Feelings of guilt or worthlessness.  Feelings of hopelessness or helplessness.  Thoughts of suicide or the desire to harm yourself (suicidal ideation).  Loss of touch with reality (psychotic symptoms). Seeing or hearing things that are not real (hallucinations) or having false beliefs about your life or the people around you (delusions and paranoia). DIAGNOSIS  The diagnosis of clinical depression usually is based on the severity and duration of the symptoms. Your caregiver also will ask you questions about your medical history and substance use to find out if physical illness, use of prescription medicine, or substance abuse is causing your depression. Your caregiver also may order blood tests. TREATMENT  Typically, normal sadness and normal grief do not require treatment. However, sometimes antidepressant medicine is prescribed for bereavement to ease the depressive symptoms until they resolve. The treatment for clinical depression depends on the severity of your symptoms but typically includes antidepressant medicine, counseling with a mental health professional, or a combination of both. Your caregiver will help to determine what treatment is best for you. Depression caused by physical illness usually goes away with appropriate medical treatment of the illness. If prescription medicine is causing depression, talk with your caregiver about stopping the medicine, decreasing the dose, or substituting another medicine. Depression caused by abuse of alcohol or illicit drugs abuse goes away with abstinence from these substances. Some adults need professional help in order to stop drinking or using drugs. SEEK IMMEDIATE CARE IF:  You have   thoughts  about hurting yourself or others.  You lose touch with reality (have psychotic symptoms).  You are taking medicine for depression and have a serious side effect. FOR MORE INFORMATION National Alliance on Mental Illness: www.nami.[employer]: http://www.maynard.net/ Document Released: 11/30/2000 Document Revised: 06/03/2012 Document Reviewed: 03/03/2012 [facility] Patient Information 2014 [place], [place]. Post-Traumatic Stress Disorder If you have been diagnosed with post-traumatic stress disorder (PTSD), you have probably experienced a traumatic event in your life. These events are usually outside of the range of normal human experience and would negatively impact any normal person.  CAUSES  A person can get PTSD after living through or seeing a dangerous event such as:  An automobile accident.  War.  Natural disaster.  Rape.  Domestic violence.  Any event where there has been a threat to life. PTSD is a real illness. PTSD Can happen to anyone at any age. Children get PTSD too. A doctor, or mental health professional with experience in treating PTSD can help you. SYMPTOMS  Not all symptoms may be present in any one person.  Distressing dreams.  Flashback: feeling the frightening event is happening again.  Avoiding activities, places, and people that remind you of the event.  Avoiding thoughts and feelings associated with the event.  Having frightening thoughts you cannot control.  Feeling on the edge with increased alertness and vigilance.  Trouble sleeping.  Feeling alone, detached from others.  Angry outbursts.  Feeling worried, guilty, or sad.  Having thoughts of hurting yourself or others. PTSD may start soon after a frightening event or months or years later. Many war veterans have PTSD. Drinking alcohol or using drugs will not help PTSD and may even make it worse.  TREATMENT  PTSD can be treated. Treatment may include "talk" therapy,  medicine, or both. Either a doctor or a mental health professional who is experienced in treating PTSD can help you. Early diagnosis and treatment is best and can show more rapid improvement. Get help if you or a loved one are thinking of hurting yourself. Call your local emergency medical services if you need help immediately. Document Released: 08/28/2001 Document Revised: 02/25/2012 Document Reviewed: 08/11/2008 [facility] Patient Information 2014 [place], [place]. Gastroesophageal Reflux Disease, Adult Gastroesophageal reflux disease (GERD) happens when acid from your stomach flows up into the esophagus. When acid comes in contact with the esophagus, the acid causes soreness (inflammation) in the esophagus. Over time, GERD may create small holes (ulcers) in the lining of the esophagus. CAUSES   Increased body weight. This puts pressure on the stomach, making acid rise from the stomach into the esophagus.  Smoking. This increases acid production in the stomach.  Drinking alcohol. This causes decreased pressure in the lower esophageal sphincter (valve or ring of muscle between the esophagus and stomach), allowing acid from the stomach into the esophagus.  Late evening meals and a full stomach. This increases pressure and acid production in the stomach.  A malformed lower esophageal sphincter. Sometimes, no cause is found. SYMPTOMS   Burning pain in the lower part of the mid-chest behind the breastbone and in the mid-stomach area. This may occur twice a week or more often.  Trouble swallowing.  Sore throat.  Dry cough.  Asthma-like symptoms including chest tightness, shortness of breath, or wheezing. DIAGNOSIS  Your caregiver may be able to diagnose GERD based on your symptoms. In some cases, X-rays and other tests may be done to check for complications or to check the condition of  your stomach and esophagus. TREATMENT  Your caregiver may recommend over-the-counter or prescription  medicines to help decrease acid production. Ask your caregiver before starting or adding any new medicines.  HOME CARE INSTRUCTIONS   Change the factors that you can control. Ask your caregiver for guidance concerning weight loss, quitting smoking, and alcohol consumption.  Avoid foods and drinks that make your symptoms worse, such as:  Caffeine or alcoholic drinks.  Chocolate.  Peppermint or mint flavorings.  Garlic and onions.  Spicy foods.  Citrus fruits, such as oranges, lemons, or limes.  Tomato-based foods such as sauce, chili, salsa, and pizza.  Fried and fatty foods.  Avoid lying down for the 3 hours prior to your bedtime or prior to taking a nap.  Eat small, frequent meals instead of large meals.  Wear loose-fitting clothing. Do not wear anything tight around your waist that causes pressure on your stomach.  Raise the head of your bed 6 to 8 inches with wood blocks to help you sleep. Extra pillows will not help.  Only take over-the-counter or prescription medicines for pain, discomfort, or fever as directed by your caregiver.  Do not take aspirin, ibuprofen, or other nonsteroidal anti-inflammatory drugs (NSAIDs). SEEK IMMEDIATE MEDICAL CARE IF:   You have pain in your arms, neck, jaw, teeth, or back.  Your pain increases or changes in intensity or duration.  You develop nausea, vomiting, or sweating (diaphoresis).  You develop shortness of breath, or you faint.  Your vomit is green, yellow, black, or looks like coffee grounds or blood.  Your stool is red, bloody, or black. These symptoms could be signs of other problems, such as heart disease, gastric bleeding, or esophageal bleeding. MAKE SURE YOU:   Understand these instructions.  Will watch your condition.  Will get help right away if you are not doing well or get worse. Document Released: 09/12/2005 Document Revised: 02/25/2012 Document Reviewed: 06/22/2011 [facility] Patient Information 2014  [place], [place]. Ventral Hernia A ventral hernia (also called an incisional hernia) is a hernia that occurs at the site of a previous surgical cut (incision) in the abdomen. The abdominal wall spans from your lower chest down to your pelvis. If the abdominal wall is weakened from a surgical incision, a hernia can occur. A hernia is a bulge of bowel or muscle tissue pushing out on the weakened part of the abdominal wall. Ventral hernias can get bigger from straining or lifting. Obese and older people are at higher risk for a ventral hernia. People who develop infections after surgery or require repeat incisions at the same site on the abdomen are also at increased risk. CAUSES  A ventral hernia occurs because of weakness in the abdominal wall at an incision site.  SYMPTOMS  Common symptoms include:  A visible bulge or lump on the abdominal wall.  Pain or tenderness around the lump.  Increased discomfort if you cough or make a sudden movement. If the hernia has blocked part of the intestine, a serious complication can occur (incarcerated or strangulated hernia). This can become a problem that requires emergency surgery because the blood flow to the blocked intestine may be cut off. Symptoms may include:  Feeling sick to your stomach (nauseous).  Throwing up (vomiting).  Stomach swelling (distention) or bloating.  Fever.  Rapid heartbeat. DIAGNOSIS  Your caregiver will take a medical history and perform a physical exam. Various tests may be ordered, such as:  Blood tests.  Urine tests.  Ultrasonography.  X-rays.  Computed  tomography (CT). TREATMENT  Watchful waiting may be all that is needed for a smaller hernia that does not cause symptoms. Your caregiver may recommend the use of a supportive belt (truss) that helps to keep the abdominal wall intact. For larger hernias or those that cause pain, surgery to repair the hernia is usually recommended. If a hernia becomes strangulated,  emergency surgery needs to be done right away. HOME CARE INSTRUCTIONS  Avoid putting pressure or strain on the abdominal area.  Avoid heavy lifting.  Use good body positioning for physical tasks. Ask your caregiver about proper body positioning.  Use a supportive belt as directed by your caregiver.  Maintain a healthy weight.  Eat foods that are high in fiber, such as whole grains, fruits, and vegetables. Fiber helps prevent difficult bowel movements (constipation).  Drink enough fluids to keep your urine clear or pale yellow.  Follow up with your caregiver as directed. SEEK MEDICAL CARE IF:   Your hernia seems to be getting larger or more painful. SEEK IMMEDIATE MEDICAL CARE IF:   You have abdominal pain that is sudden and sharp.  Your pain becomes severe.  You have repeated vomiting.  You are sweating a lot.  You notice a rapid heartbeat.  You develop a fever. MAKE SURE YOU:   Understand these instructions.  Will watch your condition.  Will get help right away if you are not doing well or get worse. Document Released: 11/19/2012 Document Reviewed: 11/07/2012 [facility] Patient Information 2014 [place], [place].

## 2013-09-10 NOTE — Progress Notes (Signed)
Pt is here to est care. C/o ventral hernia x7 yrs w/occasional mild pain Also c/o bilateral knee pain Alert w/no signs of acute distress.

## 2013-09-15 ENCOUNTER — Telehealth: Payer: Self-pay | Admitting: Emergency Medicine

## 2013-09-15 NOTE — Progress Notes (Signed)
Quick Note:  Please inform pt that labs came back OK except that cholesterol is a little elevated. Recommend rechecking labs in 4 months.   Rodney Langton, MD, CDE, FAAFP [facility] [place], [place]   ______

## 2013-09-15 NOTE — Telephone Encounter (Signed)
Message copied by Darlis Loan on Tue Sep 15, 2013  5:14 PM ------      Message from: Cleora Fleet      Created: Tue Sep 15, 2013  9:12 AM       Please inform pt that labs came back OK except that cholesterol is a little elevated.  Recommend rechecking labs in 4 months.             Rodney Langton, MD, CDE, FAAFP      [facility]      [facility]      [place], [place]        ------

## 2013-09-15 NOTE — Telephone Encounter (Signed)
INFORMED PT OF CHOLESTEROL LEVELS. PT PREFERRED TO CALL A MONTH BEFORE SCHEDULED LAB APPT

## 2013-09-22 ENCOUNTER — Ambulatory Visit (INDEPENDENT_AMBULATORY_CARE_PROVIDER_SITE_OTHER): Payer: Self-pay | Admitting: Family Medicine

## 2013-09-22 ENCOUNTER — Encounter: Payer: Self-pay | Admitting: Family Medicine

## 2013-09-22 VITALS — BP 153/90 | HR 71 | Ht 74.0 in | Wt 294.0 lb

## 2013-09-22 DIAGNOSIS — M23302 Other meniscus derangements, unspecified lateral meniscus, unspecified knee: Secondary | ICD-10-CM

## 2013-09-22 DIAGNOSIS — M23200 Derangement of unspecified lateral meniscus due to old tear or injury, right knee: Secondary | ICD-10-CM

## 2013-09-22 DIAGNOSIS — M25569 Pain in unspecified knee: Secondary | ICD-10-CM

## 2013-09-22 DIAGNOSIS — G8929 Other chronic pain: Secondary | ICD-10-CM

## 2013-09-22 MED ORDER — METHYLPREDNISOLONE ACETATE 40 MG/ML IJ SUSP
40.0000 mg | Freq: Once | INTRAMUSCULAR | Status: AC
  Administered 2013-09-22: 40 mg via INTRA_ARTICULAR
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
## 2013-09-22 NOTE — Patient Instructions (Signed)
I think that you have a meniscus tear in your knee.   We did cortisone injection today Call if any fever, redness, heat, increased pain or swelling Get your knee xrays Followup in 1 month

## 2013-09-22 NOTE — Addendum Note (Signed)
Addended by: Jacki Cones C on: 09/22/2013 05:03 PM   Modules accepted: Orders

## 2014-01-31 ENCOUNTER — Emergency Department (HOSPITAL_COMMUNITY)
Admission: EM | Admit: 2014-01-31 | Discharge: 2014-01-31 | Disposition: A | Discharge status: Home / Self Care | Payer: No Typology Code available for payment source | Attending: Emergency Medicine | Admitting: Emergency Medicine

## 2014-01-31 ENCOUNTER — Encounter (HOSPITAL_COMMUNITY): Payer: Self-pay | Admitting: Emergency Medicine

## 2014-01-31 DIAGNOSIS — S335XXA Sprain of ligaments of lumbar spine, initial encounter: Secondary | ICD-10-CM | POA: Insufficient documentation

## 2014-01-31 DIAGNOSIS — Z79899 Other long term (current) drug therapy: Secondary | ICD-10-CM | POA: Insufficient documentation

## 2014-01-31 DIAGNOSIS — IMO0002 Reserved for concepts with insufficient information to code with codable children: Secondary | ICD-10-CM | POA: Insufficient documentation

## 2014-01-31 DIAGNOSIS — Z8719 Personal history of other diseases of the digestive system: Secondary | ICD-10-CM | POA: Insufficient documentation

## 2014-01-31 DIAGNOSIS — Y9389 Activity, other specified: Secondary | ICD-10-CM | POA: Insufficient documentation

## 2014-01-31 DIAGNOSIS — M129 Arthropathy, unspecified: Secondary | ICD-10-CM | POA: Insufficient documentation

## 2014-01-31 DIAGNOSIS — T148XXA Other injury of unspecified body region, initial encounter: Secondary | ICD-10-CM

## 2014-01-31 DIAGNOSIS — S79919A Unspecified injury of unspecified hip, initial encounter: Secondary | ICD-10-CM | POA: Insufficient documentation

## 2014-01-31 DIAGNOSIS — J45909 Unspecified asthma, uncomplicated: Secondary | ICD-10-CM | POA: Insufficient documentation

## 2014-01-31 DIAGNOSIS — S139XXA Sprain of joints and ligaments of unspecified parts of neck, initial encounter: Secondary | ICD-10-CM | POA: Insufficient documentation

## 2014-01-31 DIAGNOSIS — Y92410 Unspecified street and highway as the place of occurrence of the external cause: Secondary | ICD-10-CM | POA: Insufficient documentation

## 2014-01-31 DIAGNOSIS — S79929A Unspecified injury of unspecified thigh, initial encounter: Secondary | ICD-10-CM

## 2014-01-31 DIAGNOSIS — F17200 Nicotine dependence, unspecified, uncomplicated: Secondary | ICD-10-CM | POA: Insufficient documentation

## 2014-01-31 MED ORDER — METHOCARBAMOL 500 MG PO TABS: 1000.0000 mg | ORAL_TABLET | Freq: Four times a day (QID) | ORAL | Status: DC

## 2014-01-31 MED ORDER — IBUPROFEN 600 MG PO TABS: 600.0000 mg | ORAL_TABLET | Freq: Four times a day (QID) | ORAL | Status: DC | PRN
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
## 2014-02-01 NOTE — ED Provider Notes (Signed)
Medical screening examination/treatment/procedure(s) were performed by non-physician practitioner and as supervising physician I was immediately available for consultation/collaboration.  EKG Interpretation   None         Delice Bison Ward, DO 02/01/14 0211

## 2014-02-03 ENCOUNTER — Ambulatory Visit: Payer: No Typology Code available for payment source | Attending: Internal Medicine | Admitting: Internal Medicine

## 2014-02-03 ENCOUNTER — Encounter: Payer: Self-pay | Admitting: Internal Medicine

## 2014-02-03 VITALS — BP 132/86 | HR 87 | Temp 98.8°F | Resp 16 | Ht 74.0 in | Wt 290.0 lb

## 2014-02-03 DIAGNOSIS — S335XXA Sprain of ligaments of lumbar spine, initial encounter: Secondary | ICD-10-CM | POA: Insufficient documentation

## 2014-02-03 DIAGNOSIS — R519 Headache, unspecified: Secondary | ICD-10-CM

## 2014-02-03 DIAGNOSIS — R51 Headache: Secondary | ICD-10-CM

## 2014-02-03 MED ORDER — DULOXETINE HCL 30 MG PO CPEP: 30.0000 mg | ORAL_CAPSULE | Freq: Every day | ORAL | Status: DC

## 2014-02-03 MED ORDER — TRAMADOL HCL 50 MG PO TABS: 100.0000 mg | ORAL_TABLET | Freq: Two times a day (BID) | ORAL | Status: DC | PRN

## 2014-02-03 MED ORDER — METHOCARBAMOL 500 MG PO TABS: 500.0000 mg | ORAL_TABLET | Freq: Every evening | ORAL | Status: DC | PRN
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
## 2014-02-26 ENCOUNTER — Ambulatory Visit (HOSPITAL_COMMUNITY)
Admission: RE | Admit: 2014-02-26 | Discharge: 2014-02-26 | Disposition: A | Discharge status: Home / Self Care | Payer: Medicaid Other | Source: Ambulatory Visit | Attending: Neurology | Admitting: Neurology

## 2014-02-26 ENCOUNTER — Ambulatory Visit (INDEPENDENT_AMBULATORY_CARE_PROVIDER_SITE_OTHER): Payer: Medicaid Other | Admitting: Neurology

## 2014-02-26 ENCOUNTER — Encounter: Payer: Self-pay | Admitting: Neurology

## 2014-02-26 VITALS — BP 160/74 | HR 80 | Temp 98.0°F | Resp 16 | Ht 74.4 in | Wt 294.6 lb

## 2014-02-26 DIAGNOSIS — G4486 Cervicogenic headache: Secondary | ICD-10-CM

## 2014-02-26 DIAGNOSIS — R519 Headache, unspecified: Secondary | ICD-10-CM

## 2014-02-26 DIAGNOSIS — R51 Headache: Secondary | ICD-10-CM
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
## 2014-02-26 NOTE — Patient Instructions (Addendum)
1.  I would like to start you on amitriptyline or nortriptyline.  They are antidepressants used for chronic headache.  Side effects may include sleepiness, dizziness, dry mouth or weight gain.  I want to check an EKG first and if okay, we will contact you to pick up the prescription. 2.  FOllow up in 2 months. 3.  I will need to get the notes from Dr. Catalina Gravel to see what's been tried.

## 2014-04-05 ENCOUNTER — Ambulatory Visit: Payer: Medicaid Other | Attending: Internal Medicine | Admitting: Internal Medicine

## 2014-04-05 ENCOUNTER — Encounter: Payer: Self-pay | Admitting: Internal Medicine

## 2014-04-05 VITALS — BP 158/107 | HR 81 | Temp 99.1°F | Resp 16 | Ht 74.0 in | Wt 291.0 lb

## 2014-04-05 DIAGNOSIS — G8929 Other chronic pain: Secondary | ICD-10-CM | POA: Insufficient documentation

## 2014-04-05 DIAGNOSIS — R51 Headache: Secondary | ICD-10-CM

## 2014-04-05 DIAGNOSIS — J45909 Unspecified asthma, uncomplicated: Secondary | ICD-10-CM | POA: Insufficient documentation

## 2014-04-05 DIAGNOSIS — F17200 Nicotine dependence, unspecified, uncomplicated: Secondary | ICD-10-CM | POA: Insufficient documentation

## 2014-04-05 DIAGNOSIS — Z791 Long term (current) use of non-steroidal anti-inflammatories (NSAID): Secondary | ICD-10-CM | POA: Insufficient documentation

## 2014-04-05 DIAGNOSIS — G894 Chronic pain syndrome: Secondary | ICD-10-CM

## 2014-04-05 DIAGNOSIS — I10 Essential (primary) hypertension: Secondary | ICD-10-CM | POA: Insufficient documentation

## 2014-04-05 DIAGNOSIS — K219 Gastro-esophageal reflux disease without esophagitis: Secondary | ICD-10-CM | POA: Insufficient documentation

## 2014-04-05 DIAGNOSIS — R519 Headache, unspecified: Secondary | ICD-10-CM | POA: Insufficient documentation

## 2014-04-05 DIAGNOSIS — Z79899 Other long term (current) drug therapy: Secondary | ICD-10-CM | POA: Insufficient documentation

## 2014-04-05 LAB — CBC WITH DIFFERENTIAL/PLATELET
BASOS ABS: 0.1 10*3/uL (ref 0.0–0.1)
BASOS PCT: 1 % (ref 0–1)
EOS ABS: 0.2 10*3/uL (ref 0.0–0.7)
EOS PCT: 3 % (ref 0–5)
HEMATOCRIT: 40.1 % (ref 39.0–52.0)
Hemoglobin: 13.7 g/dL (ref 13.0–17.0)
Lymphocytes Relative: 29 % (ref 12–46)
Lymphs Abs: 1.9 10*3/uL (ref 0.7–4.0)
MCH: 26.7 pg (ref 26.0–34.0)
MCHC: 34.2 g/dL (ref 30.0–36.0)
MCV: 78 fL (ref 78.0–100.0)
MONO ABS: 0.5 10*3/uL (ref 0.1–1.0)
Monocytes Relative: 8 % (ref 3–12)
Neutro Abs: 3.8 10*3/uL (ref 1.7–7.7)
Neutrophils Relative %: 59 % (ref 43–77)
PLATELETS: 215 10*3/uL (ref 150–400)
RBC: 5.14 MIL/uL (ref 4.22–5.81)
RDW: 14.1 % (ref 11.5–15.5)
WBC: 6.5 10*3/uL (ref 4.0–10.5)

## 2014-04-05 LAB — COMPLETE METABOLIC PANEL WITH GFR
ALK PHOS: 80 U/L (ref 39–117)
ALT: 21 U/L (ref 0–53)
AST: 21 U/L (ref 0–37)
Albumin: 4.3 g/dL (ref 3.5–5.2)
BUN: 8 mg/dL (ref 6–23)
CALCIUM: 9.3 mg/dL (ref 8.4–10.5)
CHLORIDE: 103 meq/L (ref 96–112)
CO2: 29 mEq/L (ref 19–32)
CREATININE: 1.15 mg/dL (ref 0.50–1.35)
GFR, Est African American: 82 mL/min
GFR, Est Non African American: 71 mL/min
Glucose, Bld: 87 mg/dL (ref 70–99)
Potassium: 4.2 mEq/L (ref 3.5–5.3)
Sodium: 142 mEq/L (ref 135–145)
Total Bilirubin: 0.3 mg/dL (ref 0.2–1.2)
Total Protein: 7 g/dL (ref 6.0–8.3)

## 2014-04-05 LAB — POCT GLYCOSYLATED HEMOGLOBIN (HGB A1C): HEMOGLOBIN A1C: 5.4

## 2014-04-05 LAB — LIPID PANEL
Cholesterol: 181 mg/dL (ref 0–200)
HDL: 42 mg/dL (ref >39)
LDL CALC: 115 mg/dL — AB (ref 0–99)
Total CHOL/HDL Ratio: 4.3 Ratio
Triglycerides: 118 mg/dL (ref <150)
VLDL: 24 mg/dL (ref 0–40)

## 2014-04-05 MED ORDER — AMITRIPTYLINE HCL 25 MG PO TABS: 25.0000 mg | ORAL_TABLET | Freq: Every day | ORAL | Status: DC

## 2014-04-05 MED ORDER — ACETAMINOPHEN-CODEINE #3 300-30 MG PO TABS: 1.0000 | ORAL_TABLET | Freq: Four times a day (QID) | ORAL | Status: DC | PRN

## 2014-04-05 MED ORDER — AMLODIPINE BESYLATE 5 MG PO TABS: 5.0000 mg | ORAL_TABLET | Freq: Every day | ORAL | Status: DC

## 2014-04-05 NOTE — Patient Instructions (Signed)
Headache and Arthritis °Headaches and arthritis are common problems. This causes an interest in the possible role of arthritis in causing headaches. Several major forms of arthritis exist. Two of the most common types are: °· Rheumatoid arthritis. °· Osteoarthritis. °Rheumatoid arthritis may begin at any age. It is a condition in which the body attacks some of its own tissues, thinking they do not belong. This leads to destruction of the bony areas around the joints. This condition may afflict any of the body's joints. It usually produces a deformity of the joint. The hands and fingers no longer appear straight but often appear angled towards one side. In some cases, the spine may be involved. Most often it is the vertebrae of the neck (cervicalspine). The areas of the neck most commonly afflicted by rheumatoid arthritis are the first and second cervical vertebrae. Curiously, rheumatoid arthritis, though it often produces severe deformities, is not always painful.  °The more common form of arthritis is osteoarthritis. It is a wear-and-tear form of arthritis. It usually does not produce deformity of the joints or destruction of the bony tissues. Rather the ligaments weaken. They may be calcified due to the body's attempt to heal the damage. The larger joints of the body and those joints that take the most stress and strain are the most often affected. In the neck region this osteoarthritis usually involves the fifth, sixth and seventh vertebrae. This is because the effects of posture produce the most fatigue on them. Osteoarthritis is often more painful than rheumatoid arthritis.  °During workups for arthritis, a test evaluating inflammation, (the sedimentation rate) often is performed. In rheumatoid arthritis, this test will usually be elevated. Other tests for inflammation may also be elevated. In patients with osteoarthritis, x-rays of the neck or jaw joints will show changes from "lipping" of the vertebrae. This  is caused by calcium deposits in the ligaments. Or they may show narrowing of the space between the vertebrae, or spur formation (from calcium deposits). If severe, it may cause obstruction of the holes where the nerves pass from the spine to the body. In rheumatoid arthritis, dislocation of vertebrae may occur in the upper neck. CT scan and MRI in patients with osteoarthritis may show bulging of the discs that cushion the vertebrae. In the most severe cases, herniation of the discs may occur.  °Headaches, felt as a pain in the neck, may be caused by arthritis if the first, second or third vertebrae are involved. This condition is due to the nerves that supply the scalp only originating from this area of the spine. Neck pain itself, whether alone or coupled with headaches, can involve any portion of the neck. If the jaw is involved, the symptoms are similar to those of Temporomandibular Joint Syndrome (TMJ).  °The progressive severity of rheumatoid arthritis may be slowed by a variety of potent medications. In osteoarthritis, its progression is not usually hindered by medication. The following may be helpful in slowing the advancement of the disorder: °· Lifestyle adjustment. °· Exercise. °· Rest. °· Weight loss. °Medications, such as the nonsteroidal anti-inflammatory agents (NSAIDs), are useful. They may reduce the pain and improve the reduced motion which occurs in joints afflicted by arthritis. From some studies, the use of acetaminophen appears to be as effective in controlling the pain of arthritis as the NSAIDs. Physical modalities may also be useful for arthritis. They include: °· Heat. °· Massage. °· Exercise. °But physical therapy must be prescribed by a caregiver, just as most medications for   arthritis.  °Document Released: 02/23/2004 Document Revised: 02/25/2012 Document Reviewed: 07/22/2008 °[employer]® Patient Information ©2014 [employer]. ° °

## 2014-04-05 NOTE — Progress Notes (Signed)
Pt is here following up on his chronic b/l knee pain. Pt states he has had a constant headache for 7 years.
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
## 2014-04-06 LAB — URINALYSIS, COMPLETE
Bacteria, UA: NONE SEEN
Bilirubin Urine: NEGATIVE
Casts: NONE SEEN
Glucose, UA: NEGATIVE mg/dL
Hgb urine dipstick: NEGATIVE
Ketones, ur: NEGATIVE mg/dL
LEUKOCYTES UA: NEGATIVE
NITRITE: NEGATIVE
PROTEIN: NEGATIVE mg/dL
SPECIFIC GRAVITY, URINE: 1.022 (ref 1.005–1.030)
SQUAMOUS EPITHELIAL / LPF: NONE SEEN
UROBILINOGEN UA: 0.2 mg/dL (ref 0.0–1.0)
pH: 5.5 (ref 5.0–8.0)

## 2014-04-06 LAB — TSH: TSH: 1.841 u[IU]/mL (ref 0.350–4.500)

## 2014-04-22 ENCOUNTER — Telehealth: Payer: Self-pay | Admitting: Emergency Medicine

## 2014-04-22 NOTE — Telephone Encounter (Signed)
Pt given lab results 

## 2014-04-22 NOTE — Telephone Encounter (Signed)
Message copied by Ricci Barker on Thu Apr 22, 2014  5:45 PM ------      Message from: Angelica Chessman E      Created: Wed Apr 21, 2014  4:11 PM       Please inform patient that his laboratory tests results are mostly within normal limit ------

## 2014-05-11 ENCOUNTER — Ambulatory Visit: Payer: Medicaid Other | Attending: Internal Medicine | Admitting: *Deleted

## 2014-05-11 VITALS — BP 122/80 | HR 73 | Temp 98.7°F

## 2014-05-11 DIAGNOSIS — N390 Urinary tract infection, site not specified: Secondary | ICD-10-CM

## 2014-05-11 LAB — POCT URINALYSIS DIPSTICK
BILIRUBIN UA: NEGATIVE
Glucose, UA: NEGATIVE
Ketones, UA: NEGATIVE
Leukocytes, UA: NEGATIVE
Nitrite, UA: NEGATIVE
Protein, UA: NEGATIVE
RBC UA: NEGATIVE
UROBILINOGEN UA: 1
pH, UA: 5.5

## 2014-05-11 NOTE — Progress Notes (Unsigned)
Patient here for frequent urination. UA was negative. Informed patient to come back to clinic if symptoms worsen. Alverda Skeans, RN

## 2014-06-22 ENCOUNTER — Ambulatory Visit: Payer: Medicaid Other | Attending: Internal Medicine | Admitting: Internal Medicine

## 2014-06-22 ENCOUNTER — Encounter: Payer: Self-pay | Admitting: Internal Medicine

## 2014-06-22 VITALS — BP 137/87 | HR 83 | Temp 98.9°F | Resp 16 | Ht 74.0 in | Wt 297.0 lb

## 2014-06-22 DIAGNOSIS — G8929 Other chronic pain: Secondary | ICD-10-CM | POA: Insufficient documentation

## 2014-06-22 DIAGNOSIS — I10 Essential (primary) hypertension: Secondary | ICD-10-CM | POA: Diagnosis not present

## 2014-06-22 DIAGNOSIS — Z1211 Encounter for screening for malignant neoplasm of colon: Secondary | ICD-10-CM | POA: Insufficient documentation

## 2014-06-22 DIAGNOSIS — G894 Chronic pain syndrome: Secondary | ICD-10-CM

## 2014-06-22 MED ORDER — ACETAMINOPHEN-CODEINE #3 300-30 MG PO TABS: 1.0000 | ORAL_TABLET | Freq: Four times a day (QID) | ORAL | Status: DC | PRN

## 2014-06-22 MED ORDER — METHOCARBAMOL 500 MG PO TABS: 500.0000 mg | ORAL_TABLET | Freq: Every evening | ORAL | Status: DC | PRN

## 2014-06-22 NOTE — Patient Instructions (Signed)
DASH Eating Plan DASH stands for "Dietary Approaches to Stop Hypertension." The DASH eating plan is a healthy eating plan that has been shown to reduce high blood pressure (hypertension). Additional health benefits may include reducing the risk of type 2 diabetes mellitus, heart disease, and stroke. The DASH eating plan may also help with weight loss. WHAT DO I NEED TO KNOW ABOUT THE DASH EATING PLAN? For the DASH eating plan, you will follow these general guidelines:  Choose foods with a percent daily value for sodium of less than 5% (as listed on the food label).  Use salt-free seasonings or herbs instead of table salt or sea salt.  Check with your health care provider or pharmacist before using salt substitutes.  Eat lower-sodium products, often labeled as "lower sodium" or "no salt added."  Eat fresh foods.  Eat more vegetables, fruits, and low-fat dairy products.  Choose whole grains. Look for the word "whole" as the first word in the ingredient list.  Choose fish and skinless chicken or turkey more often than red meat. Limit fish, poultry, and meat to 6 oz (170 g) each day.  Limit sweets, desserts, sugars, and sugary drinks.  Choose heart-healthy fats.  Limit cheese to 1 oz (28 g) per day.  Eat more home-cooked food and less restaurant, buffet, and fast food.  Limit fried foods.  Cook foods using methods other than frying.  Limit canned vegetables. If you do use them, rinse them well to decrease the sodium.  When eating at a restaurant, ask that your food be prepared with less salt, or no salt if possible. WHAT FOODS CAN I EAT? Seek help from a dietitian for individual calorie needs. Grains Whole grain or whole wheat bread. Brown rice. Whole grain or whole wheat pasta. Quinoa, bulgur, and whole grain cereals. Low-sodium cereals. Corn or whole wheat flour tortillas. Whole grain cornbread. Whole grain crackers. Low-sodium crackers. Vegetables Fresh or frozen vegetables  (raw, steamed, roasted, or grilled). Low-sodium or reduced-sodium tomato and vegetable juices. Low-sodium or reduced-sodium tomato sauce and paste. Low-sodium or reduced-sodium canned vegetables.  Fruits All fresh, canned (in natural juice), or frozen fruits. Meat and Other Protein Products Ground beef (85% or leaner), grass-fed beef, or beef trimmed of fat. Skinless chicken or turkey. Ground chicken or turkey. Pork trimmed of fat. All fish and seafood. Eggs. Dried beans, peas, or lentils. Unsalted nuts and seeds. Unsalted canned beans. Dairy Low-fat dairy products, such as skim or 1% milk, 2% or reduced-fat cheeses, low-fat ricotta or cottage cheese, or plain low-fat yogurt. Low-sodium or reduced-sodium cheeses. Fats and Oils Tub margarines without trans fats. Light or reduced-fat mayonnaise and salad dressings (reduced sodium). Avocado. Safflower, olive, or canola oils. Natural peanut or almond butter. Other Unsalted popcorn and pretzels. The items listed above may not be a complete list of recommended foods or beverages. Contact your dietitian for more options. WHAT FOODS ARE NOT RECOMMENDED? Grains White bread. White pasta. White rice. Refined cornbread. Bagels and croissants. Crackers that contain trans fat. Vegetables Creamed or fried vegetables. Vegetables in a cheese sauce. Regular canned vegetables. Regular canned tomato sauce and paste. Regular tomato and vegetable juices. Fruits Dried fruits. Canned fruit in light or heavy syrup. Fruit juice. Meat and Other Protein Products Fatty cuts of meat. Ribs, chicken wings, bacon, sausage, bologna, salami, chitterlings, fatback, hot dogs, bratwurst, and packaged luncheon meats. Salted nuts and seeds. Canned beans with salt. Dairy Whole or 2% milk, cream, half-and-half, and cream cheese. Whole-fat or sweetened yogurt. Full-fat   cheeses or blue cheese. Nondairy creamers and whipped toppings. Processed cheese, cheese spreads, or cheese  curds. Condiments Onion and garlic salt, seasoned salt, table salt, and sea salt. Canned and packaged gravies. Worcestershire sauce. Tartar sauce. Barbecue sauce. Teriyaki sauce. Soy sauce, including reduced sodium. Steak sauce. Fish sauce. Oyster sauce. Cocktail sauce. Horseradish. Ketchup and mustard. Meat flavorings and tenderizers. Bouillon cubes. Hot sauce. Tabasco sauce. Marinades. Taco seasonings. Relishes. Fats and Oils Butter, stick margarine, lard, shortening, ghee, and bacon fat. Coconut, palm kernel, or palm oils. Regular salad dressings. Other Pickles and olives. Salted popcorn and pretzels. The items listed above may not be a complete list of foods and beverages to avoid. Contact your dietitian for more information. WHERE CAN I FIND MORE INFORMATION? National Heart, Lung, and Blood Institute: travelstabloid.com Document Released: 11/22/2011 Document Revised: 12/08/2013 Document Reviewed: 10/07/2013 [facility] Patient Information 2015 [place], [place]. This information is not intended to replace advice given to you by your health care provider. Make sure you discuss any questions you have with your health care provider. Hypertension Hypertension, commonly called high blood pressure, is when the force of blood pumping through your arteries is too strong. Your arteries are the blood vessels that carry blood from your heart throughout your body. A blood pressure reading consists of a higher number over a lower number, such as 110/72. The higher number (systolic) is the pressure inside your arteries when your heart pumps. The lower number (diastolic) is the pressure inside your arteries when your heart relaxes. Ideally you want your blood pressure below 120/80. Hypertension forces your heart to work harder to pump blood. Your arteries may become narrow or stiff. Having hypertension puts you at risk for heart disease, stroke, and other problems.  RISK  FACTORS Some risk factors for high blood pressure are controllable. Others are not.  Risk factors you cannot control include:   Race. You may be at higher risk if you are African American.  Age. Risk increases with age.  Gender. Men are at higher risk than women before age 46 years. After age 78, women are at higher risk than men. Risk factors you can control include:  Not getting enough exercise or physical activity.  Being overweight.  Getting too much fat, sugar, calories, or salt in your diet.  Drinking too much alcohol. SIGNS AND SYMPTOMS Hypertension does not usually cause signs or symptoms. Extremely high blood pressure (hypertensive crisis) may cause headache, anxiety, shortness of breath, and nosebleed. DIAGNOSIS  To check if you have hypertension, your health care provider will measure your blood pressure while you are seated, with your arm held at the level of your heart. It should be measured at least twice using the same arm. Certain conditions can cause a difference in blood pressure between your right and left arms. A blood pressure reading that is higher than normal on one occasion does not mean that you need treatment. If one blood pressure reading is high, ask your health care provider about having it checked again. TREATMENT  Treating high blood pressure includes making lifestyle changes and possibly taking medication. Living a healthy lifestyle can help lower high blood pressure. You may need to change some of your habits. Lifestyle changes may include:  Following the DASH diet. This diet is high in fruits, vegetables, and whole grains. It is low in salt, red meat, and added sugars.  Getting at least 2 1/2 hours of brisk physical activity every week.  Losing weight if necessary.  Not smoking.  Limiting alcoholic beverages.  Learning ways to reduce stress. If lifestyle changes are not enough to get your blood pressure under control, your health care provider  may prescribe medicine. You may need to take more than one. Work closely with your health care provider to understand the risks and benefits. HOME CARE INSTRUCTIONS  Have your blood pressure rechecked as directed by your health care provider.   Only take medicine as directed by your health care provider. Follow the directions carefully. Blood pressure medicines must be taken as prescribed. The medicine does not work as well when you skip doses. Skipping doses also puts you at risk for problems.   Do not smoke.   Monitor your blood pressure at home as directed by your health care provider. SEEK MEDICAL CARE IF:   You think you are having a reaction to medicines taken.  You have recurrent headaches or feel dizzy.  You have swelling in your ankles.  You have trouble with your vision. SEEK IMMEDIATE MEDICAL CARE IF:  You develop a severe headache or confusion.  You have unusual weakness, numbness, or feel faint.  You have severe chest or abdominal pain.  You vomit repeatedly.  You have trouble breathing. MAKE SURE YOU:   Understand these instructions.  Will watch your condition.  Will get help right away if you are not doing well or get worse. Document Released: 12/03/2005 Document Revised: 12/08/2013 Document Reviewed: 09/25/2013 [facility] Patient Information 2015 [place], [place]. This information is not intended to replace advice given to you by your health care provider. Make sure you discuss any questions you have with your health care provider.

## 2014-06-22 NOTE — Progress Notes (Signed)
Pt is here following up on his HTN, chronic knee pain and for his polyuria.
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
## 2014-06-25 ENCOUNTER — Encounter: Payer: Self-pay | Admitting: Internal Medicine

## 2014-07-05 ENCOUNTER — Ambulatory Visit: Payer: Medicaid Other | Admitting: Internal Medicine

## 2014-07-12 ENCOUNTER — Other Ambulatory Visit: Payer: Self-pay | Admitting: Internal Medicine

## 2014-08-03 ENCOUNTER — Other Ambulatory Visit: Payer: Self-pay | Admitting: Internal Medicine

## 2014-08-05 ENCOUNTER — Other Ambulatory Visit: Payer: Self-pay | Admitting: Internal Medicine

## 2014-08-09 ENCOUNTER — Telehealth: Payer: Self-pay | Admitting: Internal Medicine

## 2014-08-09 ENCOUNTER — Telehealth: Payer: Self-pay | Admitting: *Deleted

## 2014-08-09 NOTE — Telephone Encounter (Signed)
Pt notified medication prescription at the [facility]  pharmacy

## 2014-08-09 NOTE — Telephone Encounter (Signed)
PT. Needs refill as soon as possible for amitriptyline (ELAVIL) 25 MG tablet [[id number]] .the patient is completely out of  This medication

## 2014-08-18 ENCOUNTER — Ambulatory Visit (AMBULATORY_SURGERY_CENTER): Payer: Medicaid Other | Admitting: *Deleted

## 2014-08-18 ENCOUNTER — Telehealth: Payer: Self-pay | Admitting: Internal Medicine

## 2014-08-18 VITALS — Ht 74.0 in | Wt 302.2 lb

## 2014-08-18 DIAGNOSIS — Z1211 Encounter for screening for malignant neoplasm of colon: Secondary | ICD-10-CM

## 2014-08-18 DIAGNOSIS — Z800 Family history of malignant neoplasm of digestive organs: Secondary | ICD-10-CM

## 2014-08-18 MED ORDER — MOVIPREP 100 G PO SOLR: 1.0000 | Freq: Once | ORAL | Status: DC

## 2014-08-18 NOTE — Telephone Encounter (Signed)
Called pharmacy and pharmacist states that the patient has medicaid and will only have to pay $3.00 and the patient is fine with that. No coupon necessary. ewm,rn

## 2014-08-18 NOTE — Progress Notes (Signed)
No problems with past sedation/anesthesia. ewm No home 02 use. No cpap use. ewm No past GI history. Never had a colonoscopy. ewm

## 2014-09-01 ENCOUNTER — Ambulatory Visit (AMBULATORY_SURGERY_CENTER): Payer: Medicaid Other | Admitting: Internal Medicine

## 2014-09-01 ENCOUNTER — Encounter: Payer: Self-pay | Admitting: Internal Medicine

## 2014-09-01 VITALS — BP 122/69 | HR 68 | Temp 97.8°F | Resp 14 | Wt 302.0 lb

## 2014-09-01 DIAGNOSIS — D126 Benign neoplasm of colon, unspecified: Secondary | ICD-10-CM

## 2014-09-01 DIAGNOSIS — Z800 Family history of malignant neoplasm of digestive organs: Secondary | ICD-10-CM

## 2014-09-01 DIAGNOSIS — D125 Benign neoplasm of sigmoid colon: Secondary | ICD-10-CM

## 2014-09-01 DIAGNOSIS — D122 Benign neoplasm of ascending colon: Secondary | ICD-10-CM

## 2014-09-01 DIAGNOSIS — D124 Benign neoplasm of descending colon: Secondary | ICD-10-CM

## 2014-09-01 DIAGNOSIS — D128 Benign neoplasm of rectum: Secondary | ICD-10-CM

## 2014-09-01 DIAGNOSIS — Z1211 Encounter for screening for malignant neoplasm of colon: Secondary | ICD-10-CM

## 2014-09-01 DIAGNOSIS — D123 Benign neoplasm of transverse colon: Secondary | ICD-10-CM

## 2014-09-01 DIAGNOSIS — D129 Benign neoplasm of anus and anal canal: Secondary | ICD-10-CM

## 2014-09-01 MED ORDER — SODIUM CHLORIDE 0.9 % IV SOLN: 500.0000 mL | INTRAVENOUS | Status: DC

## 2014-09-01 NOTE — Patient Instructions (Signed)
YOU HAD AN ENDOSCOPIC PROCEDURE TODAY AT [facility]: Refer to the procedure report that was given to you for any specific questions about what was found during the examination.  If the procedure report does not answer your questions, please call your gastroenterologist to clarify.  If you requested that your care partner not be given the details of your procedure findings, then the procedure report has been included in a sealed envelope for you to review at your convenience later.  YOU SHOULD EXPECT: Some feelings of bloating in the abdomen. Passage of more gas than usual.  Walking can help get rid of the air that was put into your GI tract during the procedure and reduce the bloating. If you had a lower endoscopy (such as a colonoscopy or flexible sigmoidoscopy) you may notice spotting of blood in your stool or on the toilet paper. If you underwent a bowel prep for your procedure, then you may not have a normal bowel movement for a few days.  DIET: Your first meal following the procedure should be a light meal and then it is ok to progress to your normal diet.  A half-sandwich or bowl of soup is an example of a good first meal.  Heavy or fried foods are harder to digest and may make you feel nauseous or bloated.  Likewise meals heavy in dairy and vegetables can cause extra gas to form and this can also increase the bloating.  Drink plenty of fluids but you should avoid alcoholic beverages for 24 hours.  ACTIVITY: Your care partner should take you home directly after the procedure.  You should plan to take it easy, moving slowly for the rest of the day.  You can resume normal activity the day after the procedure however you should NOT DRIVE or use heavy machinery for 24 hours (because of the sedation medicines used during the test).    SYMPTOMS TO REPORT IMMEDIATELY: A gastroenterologist can be reached at any hour.  During normal business hours, 8:30 AM to 5:00 PM Monday through Friday,  call [phone].  After hours and on weekends, please call the GI answering service at [phone] who will take a message and have the physician on call contact you.   Following lower endoscopy (colonoscopy or flexible sigmoidoscopy):  Excessive amounts of blood in the stool  Significant tenderness or worsening of abdominal pains  Swelling of the abdomen that is new, acute  Fever of 100F or higher  F  FOLLOW UP: If any biopsies were taken you will be contacted by phone or by letter within the next 1-3 weeks.  Call your gastroenterologist if you have not heard about the biopsies in 3 weeks.  Our staff will call the home number listed on your records the next business day following your procedure to check on you and address any questions or concerns that you may have at that time regarding the information given to you following your procedure. This is a courtesy call and so if there is no answer at the home number and we have not heard from you through the emergency physician on call, we will assume that you have returned to your regular daily activities without incident.  SIGNATURES/CONFIDENTIALITY: You and/or your care partner have signed paperwork which will be entered into your electronic medical record.  These signatures attest to the fact that that the information above on your After Visit Summary has been reviewed and is understood.  Full responsibility of the confidentiality  of this discharge information lies with you and/or your care-partner.   Information on polyps given to you today  Await letter with pathology results

## 2014-09-01 NOTE — Progress Notes (Signed)
Procedure ends, to recovery, report given and VSS. 

## 2014-09-01 NOTE — Op Note (Signed)
[facility]  [address] [place], [address]   COLONOSCOPY PROCEDURE REPORT  PATIENT: Kenneth, Davila  MR#: [id number] BIRTHDATE: May 01, 1956 , 58  yrs. old GENDER: Male ENDOSCOPIST: Jerene Bears, MD REFERRED IN:[facility] Doreene Burke, M.D. PROCEDURE DATE:  09/01/2014 PROCEDURE:   Colonoscopy with snare polypectomy First Screening Colonoscopy - Avg.  risk and is 50 yrs.  old or older - No.  Prior Negative Screening - Now for repeat screening. N/A  History of Adenoma - Now for follow-up colonoscopy & has been > or = to 3 yrs.  N/A  Polyps Removed Today? Yes. ASA CLASS:   Class III INDICATIONS:elevated risk screening, Patient's immediate family history of colon cancer, and first colonoscopy. MEDICATIONS: MAC sedation, administered by CRNA and propofol (Diprivan) 500mg  IV  DESCRIPTION OF PROCEDURE:   After the risks benefits and alternatives of the procedure were thoroughly explained, informed consent was obtained.  A digital rectal exam revealed no rectal mass.   The LB [id number]  endoscope was introduced through the anus and advanced to the cecum, which was identified by both the appendix and ileocecal valve. No adverse events experienced. The quality of the prep was good, using MoviPrep  The instrument was then slowly withdrawn as the colon was fully examined.     COLON FINDINGS: Two sessile polyps measuring 5 and 8 mm in size with mucous caps were found at the hepatic flexure.  Polypectomy was performed using cold snare (1) and using hot snare (1).  All resections were complete and all polyp tissue was completely retrieved.   Seven sessile polyps ranging between 3-23mm in size were found in the transverse colon.  Polypectomy was performed using cold snare.  All resections were complete and all polyp tissue was completely retrieved.   Eight sessile polyps measuring 5-7 mm in size were found in the descending colon (1), sigmoid colon (4), and rectum (3).   Polypectomy was performed using cold snare.  All resections were complete and all polyp tissue was completely retrieved.  Retroflexed views revealed no abnormalities. The time to cecum=5 minutes 57 seconds.  Withdrawal time=26 minutes 23 seconds.  The scope was withdrawn and the procedure completed. COMPLICATIONS: There were no complications.  ENDOSCOPIC IMPRESSION: 1.   Two sessile polyps measuring 5 and 8 mm in size were found at the hepatic flexure; Polypectomy was performed using cold snare and using hot snare 2.   Seven sessile polyps ranging between 3-52mm in size were found in the transverse colon; Polypectomy was performed using cold snare  3.   Eight sessile polyps measuring 5-7 mm in size were found in the descending colon, sigmoid colon, and rectum; Polypectomy was performed using cold snare  RECOMMENDATIONS: 1.  Hold aspirin, aspirin products, and anti-inflammatory medication for 2 weeks. 2.  Timing of repeat colonoscopy will be determined by pathology findings. 3.  You will receive a letter within 1-2 weeks with the results of your biopsy as well as final recommendations.  Please call my office if you have not received a letter after 3 weeks.   eSigned:  Jerene Bears, MD 09/01/2014 9:27 AM   cc: The Patient and Angelica Chessman MD   PATIENT NAME:  Kenneth, Davila MR#: [id number]

## 2014-09-02 ENCOUNTER — Telehealth: Payer: Self-pay

## 2014-09-02 NOTE — Telephone Encounter (Signed)
  Follow up Call-  Call back number 09/01/2014  Post procedure Call Back phone  # [phone] cell  Permission to leave phone message Yes     Patient questions:  Do you have a fever, pain , or abdominal swelling? No. Pain Score  0 *  Have you tolerated food without any problems? Yes.    Have you been able to return to your normal activities? Yes.    Do you have any questions about your discharge instructions: Diet   No. Medications  No. Follow up visit  No.  Do you have questions or concerns about your Care? No.  Actions: * If pain score is 4 or above: No action needed, pain <4.  No problems per the pt. maw

## 2014-09-07 ENCOUNTER — Encounter: Payer: Self-pay | Admitting: Internal Medicine

## 2014-09-07 ENCOUNTER — Other Ambulatory Visit: Payer: Self-pay

## 2014-09-07 DIAGNOSIS — Z86010 Personal history of colonic polyps: Secondary | ICD-10-CM

## 2014-09-08 ENCOUNTER — Telehealth: Payer: Self-pay | Admitting: Genetic Counselor

## 2014-09-08 NOTE — Telephone Encounter (Signed)
S/W PT IN REF TO GENETIC APPT. 09/16/14@10 :00 REFERRING DR Hilarie Fredrickson

## 2014-09-16 ENCOUNTER — Encounter: Payer: Self-pay | Admitting: Genetic Counselor

## 2014-09-16 ENCOUNTER — Ambulatory Visit (HOSPITAL_BASED_OUTPATIENT_CLINIC_OR_DEPARTMENT_OTHER): Payer: Medicaid Other | Admitting: Genetic Counselor

## 2014-09-16 ENCOUNTER — Other Ambulatory Visit: Payer: Medicaid Other

## 2014-09-16 DIAGNOSIS — Z86010 Personal history of colonic polyps: Secondary | ICD-10-CM

## 2014-09-16 DIAGNOSIS — Z800 Family history of malignant neoplasm of digestive organs: Secondary | ICD-10-CM

## 2014-09-16 DIAGNOSIS — Z8051 Family history of malignant neoplasm of kidney: Secondary | ICD-10-CM

## 2014-09-16 DIAGNOSIS — Z8042 Family history of malignant neoplasm of prostate: Secondary | ICD-10-CM

## 2014-09-16 DIAGNOSIS — Z808 Family history of malignant neoplasm of other organs or systems: Secondary | ICD-10-CM

## 2014-09-16 DIAGNOSIS — K635 Polyp of colon: Secondary | ICD-10-CM | POA: Insufficient documentation

## 2014-09-16 DIAGNOSIS — Z315 Encounter for genetic counseling: Secondary | ICD-10-CM
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
## 2014-10-12 ENCOUNTER — Encounter: Payer: Self-pay | Admitting: Genetic Counselor

## 2014-10-12 NOTE — Progress Notes (Signed)
GENETIC TEST RESULTS  Patient Name: Kenneth Davila Patient Age: 58 y.o. Encounter Date: 10/12/2014  Referring Physician: Zenovia Jarred, MD   [name] was called today to discuss genetic test results. Please see the Genetics note from his visit on 09/16/14 for a detailed discussion of his personal and family history.  GENETIC TESTING: At the time of [name] visit, we recommended he pursue genetic testing of multiple genes on the ColoNext gene panel. This test, which included sequencing and deletion/duplication analysis of 17 genes, was performed at [facility]. Testing was normal and did not reveal a mutation in these genes. The genes tested were APC, BMPR1A, CDH1, CHEK2, EPCAM, GREM1, MLH1, MSH2, MSH6, MUTYH, PMS2, POLD1, POLE, PTEN, SMAD4, STK11, and TP53.  We discussed with [name] that since the current test is not perfect, it is possible there may be a gene mutation that current testing cannot detect, but that chance is small. We also discussed that it is possible that a different genetic factor, which was not part of this testing or has not yet been discovered, is responsible for his polyps and the the cancer diagnoses in the family. Should [name] wish to discuss or pursue this additional testing, we are happy to coordinate this at any time, but do not feel that she is at significant risk of harboring a mutation in a different gene.     CANCER SCREENING: This result is generally reassuring and suggests that [name]'s history of polyps is most likely not due to an inherited predisposition. We recommended he continue to follow the colonoscopy regimen provided by his physician which will depend on his polyp burden at his colonoscopy next year.   FAMILY MEMBERS: Family members are at some increased risk of developing polyps and colon cancer, over the general population risk, simply due to the family history. We recommended they discuss with their own providers  when would be the optimal age to initiate colon cancer screening. Typically, it is recommended to begin 10 years younger than the youngest diagnosed family member.   Lastly, we discussed with [name] that cancer genetics is a rapidly advancing field and it is possible that new genetic tests will be appropriate for him in the future. We encouraged him to remain in contact with [place] on an annual basis so we can update his personal and family histories, and let him know of advances in cancer genetics that may benefit the family. Our contact number was provided. [name] questions were answered to his satisfaction today, and he knows he is welcome to call anytime with additional questions.    [name], MS, [place] Certified Genetic Counselor phone: [phone] [email or web address]

## 2015-03-21 ENCOUNTER — Emergency Department (HOSPITAL_COMMUNITY)
Admission: EM | Admit: 2015-03-21 | Discharge: 2015-03-21 | Discharge status: Absent without leave | Payer: Medicaid Other

## 2015-08-14 ENCOUNTER — Encounter: Payer: Self-pay | Admitting: Internal Medicine

## 2015-08-31 ENCOUNTER — Emergency Department (HOSPITAL_COMMUNITY): Payer: Medicaid Other

## 2015-08-31 ENCOUNTER — Emergency Department (HOSPITAL_COMMUNITY)
Admission: EM | Admit: 2015-08-31 | Discharge: 2015-08-31 | Disposition: A | Discharge status: Home / Self Care | Payer: Medicaid Other | Attending: Emergency Medicine | Admitting: Emergency Medicine

## 2015-08-31 ENCOUNTER — Encounter (HOSPITAL_COMMUNITY): Payer: Self-pay | Admitting: Neurology

## 2015-08-31 DIAGNOSIS — Z720 Tobacco use: Secondary | ICD-10-CM | POA: Insufficient documentation

## 2015-08-31 DIAGNOSIS — T148XXA Other injury of unspecified body region, initial encounter: Secondary | ICD-10-CM

## 2015-08-31 DIAGNOSIS — Y9289 Other specified places as the place of occurrence of the external cause: Secondary | ICD-10-CM | POA: Diagnosis not present

## 2015-08-31 DIAGNOSIS — W3409XA Accidental discharge from other specified firearms, initial encounter: Secondary | ICD-10-CM | POA: Insufficient documentation

## 2015-08-31 DIAGNOSIS — Y9389 Activity, other specified: Secondary | ICD-10-CM | POA: Insufficient documentation

## 2015-08-31 DIAGNOSIS — S6992XA Unspecified injury of left wrist, hand and finger(s), initial encounter: Secondary | ICD-10-CM | POA: Diagnosis present

## 2015-08-31 DIAGNOSIS — J45909 Unspecified asthma, uncomplicated: Secondary | ICD-10-CM | POA: Diagnosis not present

## 2015-08-31 DIAGNOSIS — Z79899 Other long term (current) drug therapy: Secondary | ICD-10-CM | POA: Insufficient documentation

## 2015-08-31 DIAGNOSIS — Z8719 Personal history of other diseases of the digestive system: Secondary | ICD-10-CM | POA: Diagnosis not present

## 2015-08-31 DIAGNOSIS — S61442A Puncture wound with foreign body of left hand, initial encounter: Secondary | ICD-10-CM | POA: Insufficient documentation

## 2015-08-31 DIAGNOSIS — Z86010 Personal history of colonic polyps: Secondary | ICD-10-CM | POA: Diagnosis not present

## 2015-08-31 DIAGNOSIS — Y998 Other external cause status: Secondary | ICD-10-CM | POA: Insufficient documentation

## 2015-08-31 DIAGNOSIS — M1990 Unspecified osteoarthritis, unspecified site: Secondary | ICD-10-CM | POA: Insufficient documentation

## 2015-08-31 DIAGNOSIS — Z791 Long term (current) use of non-steroidal anti-inflammatories (NSAID): Secondary | ICD-10-CM | POA: Insufficient documentation

## 2015-08-31 MED ORDER — CEPHALEXIN 500 MG PO CAPS: 500.0000 mg | ORAL_CAPSULE | Freq: Four times a day (QID) | ORAL | Status: DC
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
## 2015-08-31 NOTE — Discharge Instructions (Signed)
Follow-up with your doctor in 3 days as needed for reevaluation. Return to ED for worsening symptoms including fevers, chills, increased redness or swelling around the site with cloudy drainage.  Puncture Wound A puncture wound is an injury that extends through all layers of the skin and into the tissue beneath the skin (subcutaneous tissue). Puncture wounds become infected easily because germs often enter the body and go beneath the skin during the injury. Having a deep wound with a small entrance point makes it difficult for your caregiver to adequately clean the wound. This is especially true if you have stepped on a nail and it has passed through a dirty shoe or other situations where the wound is obviously contaminated. CAUSES  Many puncture wounds involve glass, nails, splinters, fish hooks, or other objects that enter the skin (foreign bodies). A puncture wound may also be caused by a human bite or animal bite. DIAGNOSIS  A puncture wound is usually diagnosed by your history and a physical exam. You may need to have an X-ray or an ultrasound to check for any foreign bodies still in the wound. TREATMENT   Your caregiver will clean the wound as thoroughly as possible. Depending on the location of the wound, a bandage (dressing) may be applied.  Your caregiver might prescribe antibiotic medicines.  You may need a follow-up visit to check on your wound. Follow all instructions as directed by your caregiver. HOME CARE INSTRUCTIONS   Change your dressing once per day, or as directed by your caregiver. If the dressing sticks, it may be removed by soaking the area in water.  If your caregiver has given you follow-up instructions, it is very important that you return for a follow-up appointment. Not following up as directed could result in a chronic or permanent injury, pain, and disability.  Only take over-the-counter or prescription medicines for pain, discomfort, or fever as directed by  your caregiver.  If you are given antibiotics, take them as directed. Finish them even if you start to feel better. You may need a tetanus shot if:  You cannot remember when you had your last tetanus shot.  You have never had a tetanus shot. If you got a tetanus shot, your arm may swell, get red, and feel warm to the touch. This is common and not a problem. If you need a tetanus shot and you choose not to have one, there is a rare chance of getting tetanus. Sickness from tetanus can be serious. You may need a rabies shot if an animal bite caused your puncture wound. SEEK MEDICAL CARE IF:   You have redness, swelling, or increasing pain in the wound.  You have red streaks going away from the wound.  You notice a bad smell coming from the wound or dressing.  You have yellowish-white fluid (pus) coming from the wound.  You are treated with an antibiotic for infection, but the infection is not getting better.  You notice something in the wound, such as rubber from your shoe, cloth, or another object.  You have a fever.  You have severe pain.  You have difficulty breathing.  You feel dizzy or faint.  You cannot stop vomiting.  You lose feeling, develop numbness, or cannot move a limb below the wound.  Your symptoms worsen. MAKE SURE YOU:  Understand these instructions.  Will watch your condition.  Will get help right away if you are not doing well or get worse. Document Released: 09/12/2005 Document Revised: 02/25/2012  Document Reviewed: 05/22/2011 [facility] Patient Information 2015 [place]. This information is not intended to replace advice given to you by your health care provider. Make sure you discuss any questions you have with your health care provider.
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
## 2015-09-19 ENCOUNTER — Ambulatory Visit (INDEPENDENT_AMBULATORY_CARE_PROVIDER_SITE_OTHER): Payer: Medicaid Other | Admitting: Family Medicine

## 2015-09-19 ENCOUNTER — Encounter: Payer: Self-pay | Admitting: Family Medicine

## 2015-09-19 VITALS — BP 121/75 | HR 71 | Temp 98.5°F | Resp 16 | Ht 74.0 in | Wt 265.0 lb

## 2015-09-19 DIAGNOSIS — G44329 Chronic post-traumatic headache, not intractable: Secondary | ICD-10-CM | POA: Diagnosis not present

## 2015-09-19 DIAGNOSIS — Z86010 Personal history of colonic polyps: Secondary | ICD-10-CM | POA: Insufficient documentation

## 2015-09-19 DIAGNOSIS — G8929 Other chronic pain: Secondary | ICD-10-CM | POA: Diagnosis not present

## 2015-09-19 DIAGNOSIS — Z23 Encounter for immunization: Secondary | ICD-10-CM | POA: Diagnosis not present

## 2015-09-19 DIAGNOSIS — M542 Cervicalgia: Secondary | ICD-10-CM

## 2015-09-19 DIAGNOSIS — K439 Ventral hernia without obstruction or gangrene: Secondary | ICD-10-CM

## 2015-09-19 DIAGNOSIS — Z8659 Personal history of other mental and behavioral disorders: Secondary | ICD-10-CM

## 2015-09-19 DIAGNOSIS — Z0000 Encounter for general adult medical examination without abnormal findings: Secondary | ICD-10-CM

## 2015-09-19 LAB — POCT URINALYSIS DIP (DEVICE)
Bilirubin Urine: NEGATIVE
GLUCOSE, UA: NEGATIVE mg/dL
Hgb urine dipstick: NEGATIVE
KETONES UR: NEGATIVE mg/dL
Leukocytes, UA: NEGATIVE
Nitrite: NEGATIVE
PROTEIN: NEGATIVE mg/dL
Urobilinogen, UA: 0.2 mg/dL (ref 0.0–1.0)
pH: 5.5 (ref 5.0–8.0)

## 2015-09-19 LAB — LIPID PANEL
Cholesterol: 146 mg/dL (ref 125–200)
HDL: 34 mg/dL — ABNORMAL LOW (ref >=40)
LDL CALC: 96 mg/dL (ref <130)
Total CHOL/HDL Ratio: 4.3 Ratio (ref <=5.0)
Triglycerides: 79 mg/dL (ref <150)
VLDL: 16 mg/dL (ref <30)

## 2015-09-19 LAB — CBC WITH DIFFERENTIAL/PLATELET
Basophils Absolute: 0 10*3/uL (ref 0.0–0.1)
Basophils Relative: 0 % (ref 0–1)
EOS ABS: 0.2 10*3/uL (ref 0.0–0.7)
EOS PCT: 4 % (ref 0–5)
HCT: 44.7 % (ref 39.0–52.0)
Hemoglobin: 14.7 g/dL (ref 13.0–17.0)
LYMPHS PCT: 27 % (ref 12–46)
Lymphs Abs: 1.5 10*3/uL (ref 0.7–4.0)
MCH: 26.3 pg (ref 26.0–34.0)
MCHC: 32.9 g/dL (ref 30.0–36.0)
MCV: 80 fL (ref 78.0–100.0)
MONO ABS: 0.4 10*3/uL (ref 0.1–1.0)
MONOS PCT: 8 % (ref 3–12)
MPV: 10.1 fL (ref 8.6–12.4)
Neutro Abs: 3.3 10*3/uL (ref 1.7–7.7)
Neutrophils Relative %: 61 % (ref 43–77)
PLATELETS: 214 10*3/uL (ref 150–400)
RBC: 5.59 MIL/uL (ref 4.22–5.81)
RDW: 13.9 % (ref 11.5–15.5)
WBC: 5.4 10*3/uL (ref 4.0–10.5)

## 2015-09-19 LAB — COMPLETE METABOLIC PANEL WITH GFR
ALT: 15 U/L (ref 9–46)
AST: 17 U/L (ref 10–35)
Albumin: 4.4 g/dL (ref 3.6–5.1)
Alkaline Phosphatase: 81 U/L (ref 40–115)
BILIRUBIN TOTAL: 0.6 mg/dL (ref 0.2–1.2)
BUN: 11 mg/dL (ref 7–25)
CO2: 27 mmol/L (ref 20–31)
CREATININE: 1.1 mg/dL (ref 0.70–1.33)
Calcium: 9.5 mg/dL (ref 8.6–10.3)
Chloride: 106 mmol/L (ref 98–110)
GFR, EST AFRICAN AMERICAN: 86 mL/min (ref >=60)
GFR, Est Non African American: 74 mL/min (ref >=60)
GLUCOSE: 100 mg/dL — AB (ref 65–99)
Potassium: 4.2 mmol/L (ref 3.5–5.3)
Sodium: 141 mmol/L (ref 135–146)
TOTAL PROTEIN: 7 g/dL (ref 6.1–8.1)

## 2015-09-19 LAB — HEMOGLOBIN A1C
HEMOGLOBIN A1C: 6 % — AB (ref <5.7)
Mean Plasma Glucose: 126 mg/dL — ABNORMAL HIGH (ref <117)

## 2015-09-19 MED ORDER — KETOROLAC TROMETHAMINE 60 MG/2ML IM SOLN
60.0000 mg | Freq: Once | INTRAMUSCULAR | Status: AC
  Administered 2015-09-19: 60 mg via INTRAMUSCULAR

## 2015-09-19 MED ORDER — DULOXETINE HCL 30 MG PO CPEP: 30.0000 mg | ORAL_CAPSULE | Freq: Every day | ORAL | Status: DC

## 2015-09-19 MED ORDER — MELOXICAM 7.5 MG PO TABS: 7.5000 mg | ORAL_TABLET | Freq: Every day | ORAL | Status: DC

## 2015-09-19 NOTE — Patient Instructions (Signed)
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
## 2015-09-20 ENCOUNTER — Telehealth: Payer: Self-pay | Admitting: Family Medicine

## 2015-09-20 ENCOUNTER — Encounter: Payer: Self-pay | Admitting: Internal Medicine

## 2015-09-20 LAB — SEDIMENTATION RATE: SED RATE: 4 mm/h (ref 0–20)

## 2015-09-20 NOTE — Telephone Encounter (Signed)
Reviewed laboratory values. Hemoglobin A1C elevated to 6.0 which is indicative of prediabetes. Recommend a lowfat, low carbohydrate diet divided over 5-6 small meals, increase water intake to 6-8 glasses, and 150 minutes per week of cardiovascular exercise. Will re-evaluate in 6 months. All other labs are within normal parameters.   Dorena Dew, FNP

## 2015-09-20 NOTE — Telephone Encounter (Signed)
Called and spoke with patient advised of hgba1c results and to do low fat/carb diet/ eat 5 to 6 small meals daily, drink 6-8 glasses of water daily, and to do cardio 150 minutes per week as tolerated. Patient verbalized understanding and had no questions at this time. Thanks!

## 2015-09-26 ENCOUNTER — Telehealth: Payer: Self-pay | Admitting: Family Medicine

## 2015-09-26 NOTE — Telephone Encounter (Signed)
Received medical records request from [employer]. Forwarded to [employer]. 

## 2015-10-10 ENCOUNTER — Emergency Department (HOSPITAL_COMMUNITY)
Admission: EM | Admit: 2015-10-10 | Discharge: 2015-10-10 | Disposition: A | Discharge status: Home / Self Care | Payer: Medicaid Other | Attending: Emergency Medicine | Admitting: Emergency Medicine

## 2015-10-10 DIAGNOSIS — J45909 Unspecified asthma, uncomplicated: Secondary | ICD-10-CM | POA: Diagnosis not present

## 2015-10-10 DIAGNOSIS — M1990 Unspecified osteoarthritis, unspecified site: Secondary | ICD-10-CM | POA: Insufficient documentation

## 2015-10-10 DIAGNOSIS — R1013 Epigastric pain: Secondary | ICD-10-CM | POA: Diagnosis not present

## 2015-10-10 DIAGNOSIS — Z720 Tobacco use: Secondary | ICD-10-CM | POA: Diagnosis not present

## 2015-10-10 DIAGNOSIS — Z79899 Other long term (current) drug therapy: Secondary | ICD-10-CM | POA: Diagnosis not present

## 2015-10-10 DIAGNOSIS — K219 Gastro-esophageal reflux disease without esophagitis: Secondary | ICD-10-CM | POA: Insufficient documentation

## 2015-10-10 DIAGNOSIS — Z86010 Personal history of colonic polyps: Secondary | ICD-10-CM | POA: Diagnosis not present

## 2015-10-10 DIAGNOSIS — Z7982 Long term (current) use of aspirin: Secondary | ICD-10-CM | POA: Insufficient documentation

## 2015-10-10 DIAGNOSIS — R079 Chest pain, unspecified: Secondary | ICD-10-CM | POA: Diagnosis not present

## 2015-10-10 DIAGNOSIS — R112 Nausea with vomiting, unspecified: Secondary | ICD-10-CM | POA: Insufficient documentation

## 2015-10-10 LAB — COMPREHENSIVE METABOLIC PANEL
ALBUMIN: 4 g/dL (ref 3.5–5.0)
ALT: 16 U/L — ABNORMAL LOW (ref 17–63)
ANION GAP: 9 (ref 5–15)
AST: 18 U/L (ref 15–41)
Alkaline Phosphatase: 68 U/L (ref 38–126)
BUN: 10 mg/dL (ref 6–20)
CHLORIDE: 102 mmol/L (ref 101–111)
CO2: 30 mmol/L (ref 22–32)
Calcium: 9.5 mg/dL (ref 8.9–10.3)
Creatinine, Ser: 1.23 mg/dL (ref 0.61–1.24)
GFR calc Af Amer: >60 mL/min (ref >60)
GFR calc non Af Amer: >60 mL/min (ref >60)
GLUCOSE: 95 mg/dL (ref 65–99)
POTASSIUM: 3.5 mmol/L (ref 3.5–5.1)
SODIUM: 141 mmol/L (ref 135–145)
TOTAL PROTEIN: 6.8 g/dL (ref 6.5–8.1)
Total Bilirubin: 0.6 mg/dL (ref 0.3–1.2)

## 2015-10-10 LAB — I-STAT TROPONIN, ED
Troponin i, poc: 0 ng/mL (ref 0.00–0.08)
Troponin i, poc: 0 ng/mL (ref 0.00–0.08)

## 2015-10-10 LAB — URINALYSIS, ROUTINE W REFLEX MICROSCOPIC
Bilirubin Urine: NEGATIVE
Glucose, UA: NEGATIVE mg/dL
HGB URINE DIPSTICK: NEGATIVE
Ketones, ur: NEGATIVE mg/dL
Leukocytes, UA: NEGATIVE
NITRITE: NEGATIVE
PH: 7 (ref 5.0–8.0)
Protein, ur: NEGATIVE mg/dL
SPECIFIC GRAVITY, URINE: 1.025 (ref 1.005–1.030)
UROBILINOGEN UA: 2 mg/dL — AB (ref 0.0–1.0)

## 2015-10-10 LAB — CBC
HEMATOCRIT: 44.9 % (ref 39.0–52.0)
HEMOGLOBIN: 14.5 g/dL (ref 13.0–17.0)
MCH: 26.2 pg (ref 26.0–34.0)
MCHC: 32.3 g/dL (ref 30.0–36.0)
MCV: 81 fL (ref 78.0–100.0)
Platelets: 181 10*3/uL (ref 150–400)
RBC: 5.54 MIL/uL (ref 4.22–5.81)
RDW: 13 % (ref 11.5–15.5)
WBC: 5.7 10*3/uL (ref 4.0–10.5)

## 2015-10-10 LAB — LIPASE, BLOOD: LIPASE: 28 U/L (ref 11–51)

## 2015-10-10 MED ORDER — RANITIDINE HCL 150 MG PO TABS: 150.0000 mg | ORAL_TABLET | Freq: Two times a day (BID) | ORAL | Status: DC | PRN

## 2015-10-10 MED ORDER — GI COCKTAIL ~~LOC~~
30.0000 mL | Freq: Once | ORAL | Status: AC
  Administered 2015-10-10: 30 mL via ORAL
  Filled 2015-10-10: qty 30

## 2015-10-10 MED ORDER — NITROGLYCERIN 0.4 MG SL SUBL
0.4000 mg | SUBLINGUAL_TABLET | SUBLINGUAL | Status: DC | PRN
  Administered 2015-10-10 (×2): 0.4 mg via SUBLINGUAL
  Filled 2015-10-10: qty 1

## 2015-10-10 MED ORDER — ASPIRIN EC 325 MG PO TBEC
325.0000 mg | DELAYED_RELEASE_TABLET | Freq: Once | ORAL | Status: AC
  Administered 2015-10-10: 325 mg via ORAL
  Filled 2015-10-10: qty 1

## 2015-10-10 NOTE — Discharge Instructions (Signed)

## 2015-10-10 NOTE — ED Notes (Addendum)
Patient reports intermittent severe abdominal pain, worsening in nature. Patient reports nausea and diaphoresis. Patient with noticeable hernia. Patient very uncomfortable in triage. Last bowel movement yesterday.
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
## 2015-10-10 NOTE — ED Notes (Signed)
Pt denies pain at this time

## 2015-11-09 ENCOUNTER — Ambulatory Visit (AMBULATORY_SURGERY_CENTER): Payer: Self-pay | Admitting: *Deleted

## 2015-11-09 VITALS — Ht 74.0 in | Wt 265.0 lb

## 2015-11-09 DIAGNOSIS — Z86010 Personal history of colonic polyps: Secondary | ICD-10-CM

## 2015-11-09 DIAGNOSIS — Z800 Family history of malignant neoplasm of digestive organs: Secondary | ICD-10-CM

## 2015-11-09 MED ORDER — NA SULFATE-K SULFATE-MG SULF 17.5-3.13-1.6 GM/177ML PO SOLN: 1.0000 | Freq: Once | ORAL | Status: DC

## 2015-11-09 NOTE — Progress Notes (Signed)
No egg or soy allergy No issues with past sedation No diet pills No home 02 use     

## 2015-11-21 ENCOUNTER — Ambulatory Visit (INDEPENDENT_AMBULATORY_CARE_PROVIDER_SITE_OTHER): Payer: Medicaid Other | Admitting: Family Medicine

## 2015-11-21 ENCOUNTER — Encounter: Payer: Self-pay | Admitting: Family Medicine

## 2015-11-21 VITALS — BP 131/65 | HR 87 | Temp 98.4°F | Resp 16 | Ht 74.0 in | Wt 261.0 lb

## 2015-11-21 DIAGNOSIS — Z8659 Personal history of other mental and behavioral disorders: Secondary | ICD-10-CM

## 2015-11-21 DIAGNOSIS — G44329 Chronic post-traumatic headache, not intractable: Secondary | ICD-10-CM | POA: Diagnosis not present

## 2015-11-21 DIAGNOSIS — F17200 Nicotine dependence, unspecified, uncomplicated: Secondary | ICD-10-CM

## 2015-11-21 DIAGNOSIS — K439 Ventral hernia without obstruction or gangrene: Secondary | ICD-10-CM | POA: Diagnosis not present

## 2015-11-21 DIAGNOSIS — R7303 Prediabetes: Secondary | ICD-10-CM
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
## 2015-11-21 NOTE — Patient Instructions (Signed)

## 2015-11-23 ENCOUNTER — Ambulatory Visit (AMBULATORY_SURGERY_CENTER): Payer: Medicaid Other | Admitting: Internal Medicine

## 2015-11-23 ENCOUNTER — Encounter: Payer: Self-pay | Admitting: Internal Medicine

## 2015-11-23 VITALS — BP 126/78 | HR 66 | Temp 98.0°F | Resp 18 | Ht 74.0 in | Wt 265.0 lb

## 2015-11-23 DIAGNOSIS — D124 Benign neoplasm of descending colon: Secondary | ICD-10-CM

## 2015-11-23 DIAGNOSIS — Z86010 Personal history of colonic polyps: Secondary | ICD-10-CM | POA: Diagnosis not present

## 2015-11-23 DIAGNOSIS — K635 Polyp of colon: Secondary | ICD-10-CM

## 2015-11-23 DIAGNOSIS — D123 Benign neoplasm of transverse colon: Secondary | ICD-10-CM | POA: Diagnosis not present

## 2015-11-23 DIAGNOSIS — D125 Benign neoplasm of sigmoid colon: Secondary | ICD-10-CM

## 2015-11-23 MED ORDER — SODIUM CHLORIDE 0.9 % IV SOLN: 500.0000 mL | INTRAVENOUS | Status: DC

## 2015-11-23 NOTE — Patient Instructions (Signed)
YOU HAD AN ENDOSCOPIC PROCEDURE TODAY AT [facility]:   Refer to the procedure report that was given to you for any specific questions about what was found during the examination.  If the procedure report does not answer your questions, please call your gastroenterologist to clarify.  If you requested that your care partner not be given the details of your procedure findings, then the procedure report has been included in a sealed envelope for you to review at your convenience later.  YOU SHOULD EXPECT: Some feelings of bloating in the abdomen. Passage of more gas than usual.  Walking can help get rid of the air that was put into your GI tract during the procedure and reduce the bloating. If you had a lower endoscopy (such as a colonoscopy or flexible sigmoidoscopy) you may notice spotting of blood in your stool or on the toilet paper. If you underwent a bowel prep for your procedure, you may not have a normal bowel movement for a few days.  Please Note:  You might notice some irritation and congestion in your nose or some drainage.  This is from the oxygen used during your procedure.  There is no need for concern and it should clear up in a day or so.  SYMPTOMS TO REPORT IMMEDIATELY:   Following lower endoscopy (colonoscopy or flexible sigmoidoscopy):  Excessive amounts of blood in the stool  Significant tenderness or worsening of abdominal pains  Swelling of the abdomen that is new, acute  Fever of 100F or higher   For urgent or emergent issues, a gastroenterologist can be reached at any hour by calling [phone].   DIET: Your first meal following the procedure should be a small meal and then it is ok to progress to your normal diet. Heavy or fried foods are harder to digest and may make you feel nauseous or bloated.  Likewise, meals heavy in dairy and vegetables can increase bloating.  Drink plenty of fluids but you should avoid alcoholic beverages for 24  hours.  ACTIVITY:  You should plan to take it easy for the rest of today and you should NOT DRIVE or use heavy machinery until tomorrow (because of the sedation medicines used during the test).    FOLLOW UP: Our staff will call the number listed on your records the next business day following your procedure to check on you and address any questions or concerns that you may have regarding the information given to you following your procedure. If we do not reach you, we will leave a message.  However, if you are feeling well and you are not experiencing any problems, there is no need to return our call.  We will assume that you have returned to your regular daily activities without incident.  If any biopsies were taken you will be contacted by phone or by letter within the next 1-3 weeks.  Please call us at [phone] if you have not heard about the biopsies in 3 weeks.    SIGNATURES/CONFIDENTIALITY: You and/or your care partner have signed paperwork which will be entered into your electronic medical record.  These signatures attest to the fact that that the information above on your After Visit Summary has been reviewed and is understood.  Full responsibility of the confidentiality of this discharge information lies with you and/or your care-partner.  Polyps, hemorrhoids-handouts given  Repeat colonoscopy will be determined by pathology.

## 2015-11-23 NOTE — Progress Notes (Signed)
Report to PACU, RN, vss, BBS= Clear.  

## 2015-11-23 NOTE — Progress Notes (Signed)
Called to room to assist during endoscopic procedure.  Patient ID and intended procedure confirmed with present staff. Received instructions for my participation in the procedure from the performing physician.  

## 2015-11-23 NOTE — Op Note (Signed)
[facility]  [address] [place], [id number]   COLONOSCOPY PROCEDURE REPORT  PATIENT: Kenneth Davila, Kenneth Davila  MR#: [id number] BIRTHDATE: 1956/10/10 , 82  yrs. old GENDER: male ENDOSCOPIST: Jerene Bears, MD PROCEDURE DATE:  11/23/2015 PROCEDURE:   Colonoscopy, surveillance and Colonoscopy with snare polypectomy First Screening Colonoscopy - Avg.  risk and is 50 yrs.  old or older - No.  Prior Negative Screening - Now for repeat screening. N/A  History of Adenoma - Now for follow-up colonoscopy & has been > or = to 3 yrs.  No.  It has been less than 3 yrs since last colonoscopy.  Medical reason.  History of Adenoma - Now for follow-up colonoscopy & has been > or = to 3 yrs.  Polyps removed today? Yes ASA CLASS:   Class II INDICATIONS:Surveillance due to prior colonic neoplasia, PH Colon Adenoma, and FH Colon or Rectal Adenocarcinoma.   last colonoscopy 2015 with multiple polyps removed MEDICATIONS: Monitored anesthesia care and Propofol 250 mg IV  DESCRIPTION OF PROCEDURE:   After the risks benefits and alternatives of the procedure were thoroughly explained, informed consent was obtained.  The digital rectal exam revealed no rectal mass.   The [id number]  endoscope was introduced through the anus and advanced to the terminal ileum which was intubated for a short distance. No adverse events experienced.   The quality of the prep was good.  (Suprep was used)  The instrument was then slowly withdrawn as the colon was fully examined. Estimated blood loss is zero unless otherwise noted in this procedure report.      COLON FINDINGS: Four sessile polyps ranging between 5-14mm in size were found at the hepatic flexure (1) and in the transverse colon (3).  Polypectomies were performed with a cold snare.  The resection was complete, the polyp tissue was completely retrieved and sent to histology.   Nine sessile polyps ranging between 5-27mm in size with mucous caps  were found in the descending colon and sigmoid colon.  Polypectomies were performed with a cold snare. The resection was complete, the polyp tissue was completely retrieved and sent to histology.  There are multiple hyperplastic appearing polyps in the left colon. The largest polyps were removed, though additional benign-appearing polyps were left in place.  Retroflexed views revealed small internal hemorrhoids. The time to cecum = 3.6 Withdrawal time = 23.4   The scope was withdrawn and the procedure completed.  COMPLICATIONS: There were no immediate complications.  ENDOSCOPIC IMPRESSION: 1.   Four sessile polyps ranging between 5-59mm in size were found at the hepatic flexure and in the transverse colon; polypectomies were performed with a cold snare 2.   Nine sessile polyps ranging between 5-26mm in size were found in the descending colon and sigmoid colon; polypectomies were performed with a cold snare  RECOMMENDATIONS: 1.  Await pathology results 2.  Timing of repeat colonoscopy will be determined by pathology findings. 3.  You will receive a letter within 1-2 weeks with the results of your biopsy as well as final recommendations.  Please call my office if you have not received a letter after 3 weeks.  eSigned:  Jerene Bears, MD 11/23/2015 2:13 PM   cc: the patient, Dr. Doreene Burke   PATIENT NAME:  Kenneth, Davila MR#: [id number]

## 2015-11-24 ENCOUNTER — Telehealth: Payer: Self-pay | Admitting: *Deleted

## 2015-11-24 NOTE — Telephone Encounter (Signed)
  Follow up Call-  Call back number 11/23/2015 09/01/2014  Post procedure Call Back phone  # [phone] cell  Permission to leave phone message Yes Yes     Patient questions:  Do you have a fever, pain , or abdominal swelling? No. Pain Score  0 *  Have you tolerated food without any problems? Yes.    Have you been able to return to your normal activities? Yes.    Do you have any questions about your discharge instructions: Diet   No. Medications  No. Follow up visit  no  Do you have questions or concerns about your Care? No.  Actions: * If pain score is 4 or above: No action needed, pain <4.

## 2015-11-28 ENCOUNTER — Encounter: Payer: Self-pay | Admitting: Internal Medicine

## 2015-12-07 ENCOUNTER — Other Ambulatory Visit: Payer: Self-pay | Admitting: Surgery
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
## 2016-01-13 ENCOUNTER — Telehealth: Payer: Self-pay | Admitting: Family Medicine

## 2016-01-13 NOTE — Telephone Encounter (Signed)
Patient would like a referral to a Neurologist.

## 2016-01-13 NOTE — Telephone Encounter (Signed)
Patient will need an appointment. Please schedule. Thanks!

## 2016-02-06 ENCOUNTER — Encounter (HOSPITAL_COMMUNITY): Payer: Self-pay

## 2016-02-06 ENCOUNTER — Encounter (HOSPITAL_COMMUNITY)
Admission: RE | Admit: 2016-02-06 | Discharge: 2016-02-06 | Disposition: A | Discharge status: Home / Self Care | Payer: Medicaid Other | Source: Ambulatory Visit | Attending: Surgery | Admitting: Surgery

## 2016-02-06 ENCOUNTER — Encounter (INDEPENDENT_AMBULATORY_CARE_PROVIDER_SITE_OTHER): Payer: Self-pay

## 2016-02-06 DIAGNOSIS — Z01812 Encounter for preprocedural laboratory examination: Secondary | ICD-10-CM | POA: Insufficient documentation

## 2016-02-06 DIAGNOSIS — K420 Umbilical hernia with obstruction, without gangrene: Secondary | ICD-10-CM | POA: Diagnosis not present

## 2016-02-06 HISTORY — DX: Anxiety disorder, unspecified: F41.9

## 2016-02-06 HISTORY — DX: Laceration without foreign body of abdominal wall, unspecified quadrant without penetration into peritoneal cavity, initial encounter: S31.119A

## 2016-02-06 HISTORY — DX: Other visual disturbances: H53.8

## 2016-02-06 HISTORY — DX: Other cervical disc displacement, unspecified cervical region: M50.20

## 2016-02-06 HISTORY — DX: Frequency of micturition: R35.0

## 2016-02-06 HISTORY — DX: Essential (primary) hypertension: I10

## 2016-02-06 HISTORY — DX: Depression, unspecified: F32.A

## 2016-02-06 HISTORY — DX: Anesthesia of skin: R20.2

## 2016-02-06 HISTORY — DX: Major depressive disorder, single episode, unspecified: F32.9

## 2016-02-06 HISTORY — DX: Anesthesia of skin: R20.0

## 2016-02-06 HISTORY — DX: Pneumonia, unspecified organism: J18.9

## 2016-02-06 HISTORY — DX: Personal history of other diseases of the respiratory system: Z87.09

## 2016-02-06 HISTORY — DX: Type 2 diabetes mellitus without complications: E11.9

## 2016-02-06 HISTORY — DX: Nocturia: R35.1

## 2016-02-06 LAB — CBC
HCT: 47.9 % (ref 39.0–52.0)
Hemoglobin: 14.9 g/dL (ref 13.0–17.0)
MCH: 26.6 pg (ref 26.0–34.0)
MCHC: 31.1 g/dL (ref 30.0–36.0)
MCV: 85.5 fL (ref 78.0–100.0)
PLATELETS: 170 10*3/uL (ref 150–400)
RBC: 5.6 MIL/uL (ref 4.22–5.81)
RDW: 13.9 % (ref 11.5–15.5)
WBC: 4.7 10*3/uL (ref 4.0–10.5)

## 2016-02-06 LAB — COMPREHENSIVE METABOLIC PANEL
ALK PHOS: 79 U/L (ref 38–126)
ALT: 18 U/L (ref 17–63)
AST: 21 U/L (ref 15–41)
Albumin: 4.4 g/dL (ref 3.5–5.0)
Anion gap: 8 (ref 5–15)
BUN: 9 mg/dL (ref 6–20)
CALCIUM: 9.2 mg/dL (ref 8.9–10.3)
CO2: 27 mmol/L (ref 22–32)
CREATININE: 1.17 mg/dL (ref 0.61–1.24)
Chloride: 103 mmol/L (ref 101–111)
Glucose, Bld: 98 mg/dL (ref 65–99)
Potassium: 4.8 mmol/L (ref 3.5–5.1)
Sodium: 138 mmol/L (ref 135–145)
Total Bilirubin: 0.5 mg/dL (ref 0.3–1.2)
Total Protein: 7.6 g/dL (ref 6.5–8.1)

## 2016-02-06 NOTE — Patient Instructions (Signed)
Kenneth Davila  02/06/2016   Your procedure is scheduled on: Thursday February 09, 2016   Report to [facility] Main  Entrance take [place]  elevators to 3rd floor to  [place] at 5:30 AM.  Call this number if you have problems the morning of surgery [phone]   Remember: ONLY 1 PERSON MAY GO WITH YOU TO SHORT STAY TO GET  READY MORNING OF [place].  Do not eat food or drink liquids :After Midnight.     Take these medicines the morning of surgery with A SIP OF WATER: May take hydrocodone-acetaminophen if needed; may use eye drops if needed                                You may not have any metal on your body including hair pins and              piercings  Do not wear jewelry, lotions, powders or colognes, deodorant                         Men may shave face and neck.   Do not bring valuables to the hospital. [place].  Contacts, dentures or bridgework may not be worn into surgery.  Leave suitcase in the car. After surgery it may be brought to your room.  _____________________________________________________________________             [facility] - Preparing for Surgery Before surgery, you can play an important role.  Because skin is not sterile, your skin needs to be as free of germs as possible.  You can reduce the number of germs on your skin by washing with CHG (chlorahexidine gluconate) soap before surgery.  CHG is an antiseptic cleaner which kills germs and bonds with the skin to continue killing germs even after washing. Please DO NOT use if you have an allergy to CHG or antibacterial soaps.  If your skin becomes reddened/irritated stop using the CHG and inform your nurse when you arrive at Short Stay. Do not shave (including legs and underarms) for at least 48 hours prior to the first CHG shower.  You may shave your face/neck. Please follow these instructions carefully:  1.   Shower with CHG Soap the night before surgery and the  morning of Surgery.  2.  If you choose to wash your hair, wash your hair first as usual with your  normal  shampoo.  3.  After you shampoo, rinse your hair and body thoroughly to remove the  shampoo.                           4.  Use CHG as you would any other liquid soap.  You can apply chg directly  to the skin and wash                       Gently with a scrungie or clean washcloth.  5.  Apply the CHG Soap to your body ONLY FROM THE NECK DOWN.   Do not use on face/ open  Wound or open sores. Avoid contact with eyes, ears mouth and genitals (private parts).                       Wash face,  Genitals (private parts) with your normal soap.             6.  Wash thoroughly, paying special attention to the area where your surgery  will be performed.  7.  Thoroughly rinse your body with warm water from the neck down.  8.  DO NOT shower/wash with your normal soap after using and rinsing off  the CHG Soap.                9.  Pat yourself dry with a clean towel.            10.  Wear clean pajamas.            11.  Place clean sheets on your bed the night of your first shower and do not  sleep with pets. Day of Surgery : Do not apply any lotions/deodorants the morning of surgery.  Please wear clean clothes to the hospital/surgery center.  FAILURE TO FOLLOW THESE INSTRUCTIONS MAY RESULT IN THE CANCELLATION OF YOUR SURGERY PATIENT SIGNATURE_________________________________  NURSE SIGNATURE__________________________________  ________________________________________________________________________

## 2016-02-06 NOTE — Progress Notes (Signed)
EKG in epic 10/11/2015

## 2016-02-07 LAB — HEMOGLOBIN A1C
Hgb A1c MFr Bld: 5.8 % — ABNORMAL HIGH (ref 4.8–5.6)
Mean Plasma Glucose: 120 mg/dL

## 2016-02-07 NOTE — Progress Notes (Addendum)
A1C results in epic per PAT visit 02/06/2016 sent to Dr Johney Maine

## 2016-02-08 NOTE — Anesthesia Preprocedure Evaluation (Addendum)
Anesthesia Evaluation  Patient identified by MRN, date of birth, ID band Patient awake    Reviewed: Allergy & Precautions, NPO status , Patient's Chart, lab work & pertinent test results  History of Anesthesia Complications Negative for: history of anesthetic complications  Airway Mallampati: II  TM Distance: >3 FB Neck ROM: Full    Dental no notable dental hx. (+) Dental Advisory Given   Pulmonary asthma , Current Smoker,    Pulmonary exam normal breath sounds clear to auscultation       Cardiovascular hypertension, Pt. on medications Normal cardiovascular exam Rhythm:Regular Rate:Normal     Neuro/Psych  Headaches, PSYCHIATRIC DISORDERS Anxiety Depression    GI/Hepatic Neg liver ROS, GERD  Medicated and Controlled,  Endo/Other  diabetesobesity  Renal/GU negative Renal ROS  negative genitourinary   Musculoskeletal  (+) Arthritis ,   Abdominal   Peds negative pediatric ROS (+)  Hematology negative hematology ROS (+)   Anesthesia Other Findings   Reproductive/Obstetrics negative OB ROS                           Anesthesia Physical Anesthesia Plan  ASA: II  Anesthesia Plan: General   Post-op Pain Management:    Induction: Intravenous  Airway Management Planned: Oral ETT  Additional Equipment:   Intra-op Plan:   Post-operative Plan: Extubation in OR  Informed Consent: I have reviewed the patients History and Physical, chart, labs and discussed the procedure including the risks, benefits and alternatives for the proposed anesthesia with the patient or authorized representative who has indicated his/her understanding and acceptance.   Dental advisory given  Plan Discussed with: CRNA  Anesthesia Plan Comments:         Anesthesia Quick Evaluation

## 2016-02-09 ENCOUNTER — Ambulatory Visit (HOSPITAL_COMMUNITY): Payer: Medicaid Other | Admitting: Anesthesiology

## 2016-02-09 ENCOUNTER — Ambulatory Visit (HOSPITAL_COMMUNITY)
Admission: RE | Admit: 2016-02-09 | Discharge: 2016-02-10 | Disposition: A | Discharge status: Home / Self Care | Payer: Medicaid Other | Source: Ambulatory Visit | Attending: Surgery | Admitting: Surgery

## 2016-02-09 ENCOUNTER — Encounter (HOSPITAL_COMMUNITY): Payer: Self-pay | Admitting: *Deleted

## 2016-02-09 ENCOUNTER — Encounter (HOSPITAL_COMMUNITY)
Admission: RE | Disposition: A | Discharge status: Home / Self Care | Payer: Self-pay | Source: Ambulatory Visit | Attending: Surgery

## 2016-02-09 DIAGNOSIS — K430 Incisional hernia with obstruction, without gangrene: Secondary | ICD-10-CM | POA: Diagnosis present

## 2016-02-09 DIAGNOSIS — F419 Anxiety disorder, unspecified: Secondary | ICD-10-CM | POA: Insufficient documentation

## 2016-02-09 DIAGNOSIS — K219 Gastro-esophageal reflux disease without esophagitis: Secondary | ICD-10-CM | POA: Diagnosis not present

## 2016-02-09 DIAGNOSIS — K4020 Bilateral inguinal hernia, without obstruction or gangrene, not specified as recurrent: Secondary | ICD-10-CM

## 2016-02-09 DIAGNOSIS — Z6832 Body mass index (BMI) 32.0-32.9, adult: Secondary | ICD-10-CM | POA: Insufficient documentation

## 2016-02-09 DIAGNOSIS — Z9049 Acquired absence of other specified parts of digestive tract: Secondary | ICD-10-CM | POA: Diagnosis not present

## 2016-02-09 DIAGNOSIS — Z79899 Other long term (current) drug therapy: Secondary | ICD-10-CM | POA: Insufficient documentation

## 2016-02-09 DIAGNOSIS — F329 Major depressive disorder, single episode, unspecified: Secondary | ICD-10-CM | POA: Diagnosis not present

## 2016-02-09 DIAGNOSIS — K660 Peritoneal adhesions (postprocedural) (postinfection): Secondary | ICD-10-CM | POA: Diagnosis not present

## 2016-02-09 DIAGNOSIS — Z791 Long term (current) use of non-steroidal anti-inflammatories (NSAID): Secondary | ICD-10-CM | POA: Insufficient documentation

## 2016-02-09 DIAGNOSIS — E669 Obesity, unspecified: Secondary | ICD-10-CM | POA: Insufficient documentation

## 2016-02-09 DIAGNOSIS — F17210 Nicotine dependence, cigarettes, uncomplicated: Secondary | ICD-10-CM | POA: Diagnosis not present

## 2016-02-09 DIAGNOSIS — M1990 Unspecified osteoarthritis, unspecified site: Secondary | ICD-10-CM | POA: Diagnosis not present

## 2016-02-09 DIAGNOSIS — Z9079 Acquired absence of other genital organ(s): Secondary | ICD-10-CM | POA: Insufficient documentation

## 2016-02-09 DIAGNOSIS — R590 Localized enlarged lymph nodes: Secondary | ICD-10-CM | POA: Diagnosis not present

## 2016-02-09 DIAGNOSIS — D176 Benign lipomatous neoplasm of spermatic cord: Secondary | ICD-10-CM | POA: Insufficient documentation

## 2016-02-09 HISTORY — PX: VENTRAL HERNIA REPAIR: SHX424

## 2016-02-09 HISTORY — PX: INSERTION OF MESH: SHX5868

## 2016-02-09 LAB — GLUCOSE, CAPILLARY: GLUCOSE-CAPILLARY: 100 mg/dL — AB (ref 65–99)

## 2016-02-09 SURGERY — REPAIR, HERNIA, VENTRAL, LAPAROSCOPIC
Anesthesia: General | Site: Abdomen

## 2016-02-09 MED ORDER — BUPIVACAINE LIPOSOME 1.3 % IJ SUSP
INTRAMUSCULAR | Status: DC | PRN
  Administered 2016-02-09: 20 mL

## 2016-02-09 MED ORDER — FENTANYL CITRATE (PF) 250 MCG/5ML IJ SOLN
INTRAMUSCULAR | Status: AC
  Filled 2016-02-09: qty 5

## 2016-02-09 MED ORDER — SODIUM CHLORIDE 0.9% FLUSH: 3.0000 mL | INTRAVENOUS | Status: DC | PRN

## 2016-02-09 MED ORDER — PROPOFOL 10 MG/ML IV BOLUS
INTRAVENOUS | Status: DC | PRN
Start: 2016-02-09 — End: 2016-02-09
  Administered 2016-02-09: 200 mg via INTRAVENOUS

## 2016-02-09 MED ORDER — POLYVINYL ALCOHOL 1.4 % OP SOLN
1.0000 [drp] | Freq: Every day | OPHTHALMIC | Status: DC | PRN
Start: 2016-02-09 — End: 2016-02-10
  Filled 2016-02-09: qty 15

## 2016-02-09 MED ORDER — ENOXAPARIN SODIUM 40 MG/0.4ML ~~LOC~~ SOLN
40.0000 mg | SUBCUTANEOUS | Status: DC
  Administered 2016-02-10: 40 mg via SUBCUTANEOUS
  Filled 2016-02-09 (×2): qty 0.4

## 2016-02-09 MED ORDER — EPHEDRINE SULFATE 50 MG/ML IJ SOLN
INTRAMUSCULAR | Status: AC
  Filled 2016-02-09: qty 1

## 2016-02-09 MED ORDER — ROCURONIUM BROMIDE 100 MG/10ML IV SOLN
INTRAVENOUS | Status: AC
  Filled 2016-02-09: qty 1

## 2016-02-09 MED ORDER — POLYETHYLENE GLYCOL 3350 17 G PO PACK: 17.0000 g | PACK | Freq: Two times a day (BID) | ORAL | Status: DC | PRN

## 2016-02-09 MED ORDER — KETOROLAC TROMETHAMINE 30 MG/ML IJ SOLN
INTRAMUSCULAR | Status: DC | PRN
  Administered 2016-02-09: 30 mg via INTRAVENOUS

## 2016-02-09 MED ORDER — BISACODYL 10 MG RE SUPP: 10.0000 mg | Freq: Two times a day (BID) | RECTAL | Status: DC | PRN

## 2016-02-09 MED ORDER — PHENOL 1.4 % MT LIQD
2.0000 | OROMUCOSAL | Status: DC | PRN
  Filled 2016-02-09: qty 177

## 2016-02-09 MED ORDER — HYDROCODONE-ACETAMINOPHEN 5-325 MG PO TABS
1.0000 | ORAL_TABLET | ORAL | Status: DC | PRN
  Administered 2016-02-09 – 2016-02-10 (×2): 1 via ORAL
  Filled 2016-02-09 (×2): qty 1

## 2016-02-09 MED ORDER — ONDANSETRON HCL 4 MG/2ML IJ SOLN: 4.0000 mg | Freq: Four times a day (QID) | INTRAMUSCULAR | Status: DC | PRN

## 2016-02-09 MED ORDER — MIDAZOLAM HCL 5 MG/5ML IJ SOLN
INTRAMUSCULAR | Status: DC | PRN
  Administered 2016-02-09: 2 mg via INTRAVENOUS

## 2016-02-09 MED ORDER — DIPHENHYDRAMINE HCL 50 MG/ML IJ SOLN
12.5000 mg | Freq: Four times a day (QID) | INTRAMUSCULAR | Status: DC | PRN
Start: 2016-02-09 — End: 2016-02-10

## 2016-02-09 MED ORDER — DULOXETINE HCL 30 MG PO CPEP
30.0000 mg | ORAL_CAPSULE | Freq: Every day | ORAL | Status: DC
  Administered 2016-02-10: 30 mg via ORAL
  Filled 2016-02-09 (×2): qty 1

## 2016-02-09 MED ORDER — PROPOFOL 10 MG/ML IV BOLUS
INTRAVENOUS | Status: AC
  Filled 2016-02-09: qty 20

## 2016-02-09 MED ORDER — BUPIVACAINE-EPINEPHRINE 0.25% -1:200000 IJ SOLN
INTRAMUSCULAR | Status: AC
  Filled 2016-02-09: qty 2

## 2016-02-09 MED ORDER — MIDAZOLAM HCL 2 MG/2ML IJ SOLN
INTRAMUSCULAR | Status: AC
  Filled 2016-02-09: qty 2

## 2016-02-09 MED ORDER — SUGAMMADEX SODIUM 200 MG/2ML IV SOLN
INTRAVENOUS | Status: DC | PRN
  Administered 2016-02-09: 200 mg via INTRAVENOUS

## 2016-02-09 MED ORDER — HYDROMORPHONE HCL 1 MG/ML IJ SOLN
INTRAMUSCULAR | Status: AC
  Administered 2016-02-09: 1 mg
  Filled 2016-02-09: qty 1

## 2016-02-09 MED ORDER — CHLORHEXIDINE GLUCONATE 4 % EX LIQD: 1.0000 "application " | Freq: Once | CUTANEOUS | Status: DC

## 2016-02-09 MED ORDER — BUPIVACAINE LIPOSOME 1.3 % IJ SUSP
20.0000 mL | INTRAMUSCULAR | Status: DC
  Filled 2016-02-09: qty 20

## 2016-02-09 MED ORDER — HYDROCODONE-ACETAMINOPHEN 5-325 MG PO TABS: 1.0000 | ORAL_TABLET | ORAL | Status: DC | PRN

## 2016-02-09 MED ORDER — LACTATED RINGERS IR SOLN
Status: DC | PRN
  Administered 2016-02-09: 1000 mL

## 2016-02-09 MED ORDER — PROMETHAZINE HCL 25 MG/ML IJ SOLN: 6.2500 mg | INTRAMUSCULAR | Status: DC | PRN

## 2016-02-09 MED ORDER — LACTATED RINGERS IV SOLN
INTRAVENOUS | Status: DC | PRN
  Administered 2016-02-09: 07:00:00 via INTRAVENOUS
  Administered 2016-02-09: 1000 mL

## 2016-02-09 MED ORDER — NAPROXEN 500 MG PO TABS
500.0000 mg | ORAL_TABLET | Freq: Two times a day (BID) | ORAL | Status: DC
  Administered 2016-02-09 – 2016-02-10 (×2): 500 mg via ORAL
  Filled 2016-02-09 (×4): qty 1

## 2016-02-09 MED ORDER — SODIUM CHLORIDE 0.9% FLUSH
3.0000 mL | Freq: Two times a day (BID) | INTRAVENOUS | Status: DC
  Administered 2016-02-09: 3 mL via INTRAVENOUS

## 2016-02-09 MED ORDER — FENTANYL CITRATE (PF) 100 MCG/2ML IJ SOLN
INTRAMUSCULAR | Status: DC | PRN
  Administered 2016-02-09 (×5): 50 ug via INTRAVENOUS

## 2016-02-09 MED ORDER — 0.9 % SODIUM CHLORIDE (POUR BTL) OPTIME
TOPICAL | Status: DC | PRN
  Administered 2016-02-09: 1000 mL

## 2016-02-09 MED ORDER — STERILE WATER FOR IRRIGATION IR SOLN
Status: DC | PRN
  Administered 2016-02-09: 1000 mL

## 2016-02-09 MED ORDER — ONDANSETRON HCL 4 MG/2ML IJ SOLN: 4.0000 mg | Freq: Once | INTRAMUSCULAR | Status: DC | PRN

## 2016-02-09 MED ORDER — ONDANSETRON 4 MG PO TBDP: 4.0000 mg | ORAL_TABLET | Freq: Four times a day (QID) | ORAL | Status: DC | PRN

## 2016-02-09 MED ORDER — SUGAMMADEX SODIUM 200 MG/2ML IV SOLN
INTRAVENOUS | Status: AC
  Filled 2016-02-09: qty 2

## 2016-02-09 MED ORDER — SODIUM CHLORIDE 0.9 % IJ SOLN
INTRAMUSCULAR | Status: DC | PRN
Start: 2016-02-09 — End: 2016-02-09
  Administered 2016-02-09: 100 mL

## 2016-02-09 MED ORDER — LIDOCAINE HCL (CARDIAC) 20 MG/ML IV SOLN
INTRAVENOUS | Status: AC
  Filled 2016-02-09: qty 5

## 2016-02-09 MED ORDER — SODIUM CHLORIDE 0.9 % IJ SOLN
INTRAMUSCULAR | Status: AC
  Filled 2016-02-09: qty 100

## 2016-02-09 MED ORDER — ONDANSETRON HCL 4 MG/2ML IJ SOLN
INTRAMUSCULAR | Status: AC
  Filled 2016-02-09: qty 2

## 2016-02-09 MED ORDER — LIDOCAINE HCL (CARDIAC) 20 MG/ML IV SOLN
INTRAVENOUS | Status: DC | PRN
  Administered 2016-02-09: 100 mg via INTRAVENOUS

## 2016-02-09 MED ORDER — CEFAZOLIN SODIUM-DEXTROSE 2-3 GM-% IV SOLR
2.0000 g | Freq: Three times a day (TID) | INTRAVENOUS | Status: AC
  Administered 2016-02-09: 2 g via INTRAVENOUS
  Filled 2016-02-09: qty 50

## 2016-02-09 MED ORDER — SODIUM CHLORIDE 0.9 % IV SOLN
250.0000 mL | INTRAVENOUS | Status: DC | PRN
  Administered 2016-02-09: 250 mL via INTRAVENOUS

## 2016-02-09 MED ORDER — CEFAZOLIN SODIUM-DEXTROSE 2-3 GM-% IV SOLR
2.0000 g | INTRAVENOUS | Status: AC
  Administered 2016-02-09: 2 g via INTRAVENOUS

## 2016-02-09 MED ORDER — HYDROMORPHONE HCL 1 MG/ML IJ SOLN
0.2500 mg | INTRAMUSCULAR | Status: DC | PRN
  Administered 2016-02-09 (×2): 0.5 mg via INTRAVENOUS

## 2016-02-09 MED ORDER — FAMOTIDINE 40 MG PO TABS
40.0000 mg | ORAL_TABLET | Freq: Two times a day (BID) | ORAL | Status: DC
  Administered 2016-02-09 – 2016-02-10 (×3): 40 mg via ORAL
  Filled 2016-02-09 (×4): qty 1

## 2016-02-09 MED ORDER — CEFAZOLIN SODIUM-DEXTROSE 2-3 GM-% IV SOLR
INTRAVENOUS | Status: AC
  Filled 2016-02-09: qty 50

## 2016-02-09 MED ORDER — ALUM & MAG HYDROXIDE-SIMETH 200-200-20 MG/5ML PO SUSP: 30.0000 mL | Freq: Four times a day (QID) | ORAL | Status: DC | PRN

## 2016-02-09 MED ORDER — MENTHOL 3 MG MT LOZG
1.0000 | LOZENGE | OROMUCOSAL | Status: DC | PRN
  Filled 2016-02-09: qty 9

## 2016-02-09 MED ORDER — ONDANSETRON HCL 4 MG/2ML IJ SOLN
INTRAMUSCULAR | Status: DC | PRN
  Administered 2016-02-09: 4 mg via INTRAVENOUS

## 2016-02-09 MED ORDER — METHOCARBAMOL 1000 MG/10ML IJ SOLN
1000.0000 mg | Freq: Four times a day (QID) | INTRAVENOUS | Status: DC | PRN
  Filled 2016-02-09: qty 10

## 2016-02-09 MED ORDER — SODIUM CHLORIDE 0.9 % IV SOLN
8.0000 mg | Freq: Four times a day (QID) | INTRAVENOUS | Status: DC | PRN
  Filled 2016-02-09: qty 4

## 2016-02-09 MED ORDER — HYDROMORPHONE HCL 1 MG/ML IJ SOLN
0.5000 mg | INTRAMUSCULAR | Status: DC | PRN
  Administered 2016-02-09: 1 mg via INTRAVENOUS
  Filled 2016-02-09: qty 1

## 2016-02-09 MED ORDER — LACTATED RINGERS IV BOLUS (SEPSIS): 1000.0000 mL | Freq: Three times a day (TID) | INTRAVENOUS | Status: DC | PRN

## 2016-02-09 MED ORDER — LIP MEDEX EX OINT
1.0000 "application " | TOPICAL_OINTMENT | Freq: Two times a day (BID) | CUTANEOUS | Status: DC
  Administered 2016-02-09 – 2016-02-10 (×3): 1 via TOPICAL
  Filled 2016-02-09: qty 7

## 2016-02-09 MED ORDER — BUPIVACAINE-EPINEPHRINE 0.25% -1:200000 IJ SOLN
INTRAMUSCULAR | Status: DC | PRN
  Administered 2016-02-09: 50 mL

## 2016-02-09 MED ORDER — MAGIC MOUTHWASH
15.0000 mL | Freq: Four times a day (QID) | ORAL | Status: DC | PRN
  Filled 2016-02-09: qty 15

## 2016-02-09 MED ORDER — SODIUM CHLORIDE 0.9 % IJ SOLN
INTRAMUSCULAR | Status: AC
  Filled 2016-02-09: qty 10

## 2016-02-09 MED ORDER — NAPROXEN 500 MG PO TABS: 500.0000 mg | ORAL_TABLET | Freq: Two times a day (BID) | ORAL | Status: DC

## 2016-02-09 MED ORDER — ROCURONIUM BROMIDE 100 MG/10ML IV SOLN
INTRAVENOUS | Status: DC | PRN
  Administered 2016-02-09: 60 mg via INTRAVENOUS
  Administered 2016-02-09 (×5): 10 mg via INTRAVENOUS
  Administered 2016-02-09 (×2): 20 mg via INTRAVENOUS

## 2016-02-09 SURGICAL SUPPLY — 45 items
APPLIER CLIP 5 13 M/L LIGAMAX5 (MISCELLANEOUS)
APR CLP MED LRG 5 ANG JAW (MISCELLANEOUS)
BINDER ABDOMINAL 12 ML 46-62 (SOFTGOODS) ×2 IMPLANT
CABLE HIGH FREQUENCY MONO STRZ (ELECTRODE) ×4 IMPLANT
CHLORAPREP W/TINT 26ML (MISCELLANEOUS) ×4 IMPLANT
CLIP APPLIE 5 13 M/L LIGAMAX5 (MISCELLANEOUS) IMPLANT
CLOSURE WOUND 1/2 X4 (GAUZE/BANDAGES/DRESSINGS) ×2
COVER SURGICAL LIGHT HANDLE (MISCELLANEOUS) ×4 IMPLANT
DECANTER SPIKE VIAL GLASS SM (MISCELLANEOUS) ×4 IMPLANT
DEVICE SECURE STRAP 25 ABSORB (INSTRUMENTS) ×4 IMPLANT
DEVICE TROCAR PUNCTURE CLOSURE (ENDOMECHANICALS) ×4 IMPLANT
DRAPE LAPAROSCOPIC ABDOMINAL (DRAPES) ×4 IMPLANT
DRAPE WARM FLUID 44X44 (DRAPE) ×4 IMPLANT
DRSG TEGADERM 2-3/8X2-3/4 SM (GAUZE/BANDAGES/DRESSINGS) ×12 IMPLANT
DRSG TEGADERM 4X4.75 (GAUZE/BANDAGES/DRESSINGS) IMPLANT
ELECT PENCIL ROCKER SW 15FT (MISCELLANEOUS) ×2 IMPLANT
ELECT REM PT RETURN 9FT ADLT (ELECTROSURGICAL) ×4
ELECTRODE REM PT RTRN 9FT ADLT (ELECTROSURGICAL) ×2 IMPLANT
GAUZE SPONGE 2X2 8PLY STRL LF (GAUZE/BANDAGES/DRESSINGS) IMPLANT
GLOVE ECLIPSE 8.0 STRL XLNG CF (GLOVE) ×4 IMPLANT
GLOVE INDICATOR 8.0 STRL GRN (GLOVE) ×4 IMPLANT
GOWN STRL REUS W/TWL XL LVL3 (GOWN DISPOSABLE) ×8 IMPLANT
KIT BASIN OR (CUSTOM PROCEDURE TRAY) ×4 IMPLANT
MARKER SKIN DUAL TIP RULER LAB (MISCELLANEOUS) ×4 IMPLANT
MESH ULTRAPRO 6X6 15CM15CM (Mesh General) ×4 IMPLANT
MESH VENTRALIGHT ST 8X10 (Mesh General) ×2 IMPLANT
NDL SPNL 22GX3.5 QUINCKE BK (NEEDLE) IMPLANT
NEEDLE SPNL 22GX3.5 QUINCKE BK (NEEDLE) IMPLANT
PAD POSITIONING PINK XL (MISCELLANEOUS) ×4 IMPLANT
POSITIONER SURGICAL ARM (MISCELLANEOUS) IMPLANT
SCISSORS LAP 5X35 DISP (ENDOMECHANICALS) ×4 IMPLANT
SET IRRIG TUBING LAPAROSCOPIC (IRRIGATION / IRRIGATOR) IMPLANT
SHEARS HARMONIC ACE PLUS 36CM (ENDOMECHANICALS) IMPLANT
SLEEVE XCEL OPT CAN 5 100 (ENDOMECHANICALS) ×8 IMPLANT
SPONGE GAUZE 2X2 STER 10/PKG (GAUZE/BANDAGES/DRESSINGS) ×2
STRIP CLOSURE SKIN 1/2X4 (GAUZE/BANDAGES/DRESSINGS) ×6 IMPLANT
SUT MNCRL AB 4-0 PS2 18 (SUTURE) ×6 IMPLANT
SUT PDS AB 1 CT1 27 (SUTURE) ×10 IMPLANT
SUT PROLENE 1 CT 1 30 (SUTURE) ×30 IMPLANT
TAPE CLOTH 4X10 WHT NS (GAUZE/BANDAGES/DRESSINGS) IMPLANT
TOWEL OR 17X26 10 PK STRL BLUE (TOWEL DISPOSABLE) ×4 IMPLANT
TRAY LAPAROSCOPIC (CUSTOM PROCEDURE TRAY) ×4 IMPLANT
TROCAR BLADELESS OPT 5 100 (ENDOMECHANICALS) ×4 IMPLANT
TROCAR XCEL NON-BLD 11X100MML (ENDOMECHANICALS) IMPLANT
TUBING INSUF HEATED (TUBING) ×4 IMPLANT

## 2016-02-09 NOTE — OR Nursing (Addendum)
Attempted to place a 92F foley catheter met resistance, Kenneth Davila attempted a 92F coude and met resistance. Dr. Johney Maine was notified and said to not place foley. Mohammed Kindle, RN

## 2016-02-09 NOTE — Interval H&P Note (Signed)
History and Physical Interval Note:  02/09/2016 7:24 AM  Kenneth Davila  has presented today for surgery, with the diagnosis of Incarcerated incisional ventral wall abdominal wall hernia. Left inguinal hernia possible right inguinal hernia  The various methods of treatment have been discussed with the patient and family. After consideration of risks, benefits and other options for treatment, the patient has consented to  Procedure(s): [place] (N/A) INSERTION OF MESH (N/A) POSSIBLE ORCHIECTOMY (N/A) as a surgical intervention .  The patient's history has been reviewed, patient examined, no change in status, stable for surgery.  I have reviewed the patient's chart and labs.  Questions were answered to the patient's satisfaction.     Pax Reasoner C.

## 2016-02-09 NOTE — Anesthesia Procedure Notes (Signed)
Procedure Name: Intubation Date/Time: 02/09/2016 7:41 AM Performed by: Glory Buff Pre-anesthesia Checklist: Patient identified, Emergency Drugs available, Suction available and Patient being monitored Patient Re-evaluated:Patient Re-evaluated prior to inductionOxygen Delivery Method: Circle System Utilized Preoxygenation: Pre-oxygenation with 100% oxygen Intubation Type: IV induction Ventilation: Mask ventilation without difficulty Laryngoscope Size: Miller and 3 Grade View: Grade I Tube type: Oral Tube size: 7.5 mm Number of attempts: 1 Airway Equipment and Method: Stylet and Oral airway Placement Confirmation: ETT inserted through vocal cords under direct vision,  positive ETCO2 and breath sounds checked- equal and bilateral Secured at: 22 cm Tube secured with: Tape Dental Injury: Teeth and Oropharynx as per pre-operative assessment

## 2016-02-09 NOTE — Anesthesia Postprocedure Evaluation (Signed)
Anesthesia Post Note  Patient: Kenneth Davila  Procedure(s) Performed: Procedure(s) (LRB): LAPAROSCOPIC REPAIR INCARCERATED INCISIONAL HERNIA  WITH MESH, BILATERAL INGUINAL HERNIA REPAIR WITH MESH, EXCISION RIGHT ILIAC LYMPH NODE (N/A) INSERTION OF MESH (N/A)  Patient location during evaluation: PACU Anesthesia Type: General Level of consciousness: awake and alert Pain management: pain level controlled Vital Signs Assessment: post-procedure vital signs reviewed and stable Respiratory status: spontaneous breathing, nonlabored ventilation, respiratory function stable and patient connected to nasal cannula oxygen Cardiovascular status: blood pressure returned to baseline and stable Postop Assessment: no signs of nausea or vomiting Anesthetic complications: no    Last Vitals:  Filed Vitals:   02/09/16 0517 02/09/16 1142  BP: 128/79 144/128  Pulse: 71 89  Temp: 36.6 C 36.6 C  Resp: 18 15    Last Pain:  Filed Vitals:   02/09/16 1200  PainSc: 6                  Raine Elsass JENNETTE

## 2016-02-09 NOTE — Discharge Instructions (Signed)
HERNIA REPAIR: POST OP INSTRUCTIONS ° °1. DIET: Follow a light bland diet the first 24 hours after arrival home, such as soup, liquids, crackers, etc.  Be sure to include lots of fluids daily.  Avoid fast food or heavy meals as your are more likely to get nauseated.  Eat a low fat the next few days after surgery. °2. Take your usually prescribed home medications unless otherwise directed. °3. PAIN CONTROL: °a. Pain is best controlled by a usual combination of three different methods TOGETHER: °i. Ice/Heat °ii. Over the counter pain medication °iii. Prescription pain medication °b. Most patients will experience some swelling and bruising around the hernia(s) such as the bellybutton, groins, or old incisions.  Ice packs or heating pads (30-60 minutes up to 6 times a day) will help. Use ice for the first few days to help decrease swelling and bruising, then switch to heat to help relax tight/sore spots and speed recovery.  Some people prefer to use ice alone, heat alone, alternating between ice & heat.  Experiment to what works for you.  Swelling and bruising can take several weeks to resolve.   °c. It is helpful to take an over-the-counter pain medication regularly for the first few weeks.  Choose one of the following that works best for you: °i. Naproxen (Aleve, etc)  Two 220mg tabs twice a day °ii. Ibuprofen (Advil, etc) Three 200mg tabs four times a day (every meal & bedtime) °iii. Acetaminophen (Tylenol, etc) 325-650mg four times a day (every meal & bedtime) °d. A  prescription for pain medication should be given to you upon discharge.  Take your pain medication as prescribed.  °i. If you are having problems/concerns with the prescription medicine (does not control pain, nausea, vomiting, rash, itching, etc), please call us [phone] to see if we need to switch you to a different pain medicine that will work better for you and/or control your side effect better. °ii. If you need a refill on your pain  medication, please contact your pharmacy.  They will contact our office to request authorization. Prescriptions will not be filled after 5 pm or on week-ends. °4. Avoid getting constipated.  Between the surgery and the pain medications, it is common to experience some constipation.  Increasing fluid intake and taking a fiber supplement (such as Metamucil, Citrucel, FiberCon, MiraLax, etc) 1-2 times a day regularly will usually help prevent this problem from occurring.  A mild laxative (prune juice, Milk of Magnesia, MiraLax, etc) should be taken according to package directions if there are no bowel movements after 48 hours.   °5. Wash / shower every day.  You may shower over the dressings as they are waterproof.   °6. Remove your waterproof bandages 5 days after surgery.  You may leave the incision open to air.  You may replace a dressing/Band-Aid to cover the incision for comfort if you wish.  Continue to shower over incision(s) after the dressing is off. ° ° ° °7. ACTIVITIES as tolerated:   °a. You may resume regular (light) daily activities beginning the next day--such as daily self-care, walking, climbing stairs--gradually increasing activities as tolerated.  If you can walk 30 minutes without difficulty, it is safe to try more intense activity such as jogging, treadmill, bicycling, low-impact aerobics, swimming, etc. °b. Save the most intensive and strenuous activity for last such as sit-ups, heavy lifting, contact sports, etc  Refrain from any heavy lifting or straining until you are off narcotics for pain control.   °  c. DO NOT PUSH THROUGH PAIN.  Let pain be your guide: If it hurts to do something, don't do it.  Pain is your body warning you to avoid that activity for another week until the pain goes down. d. You may drive when you are no longer taking prescription pain medication, you can comfortably wear a seatbelt, and you can safely maneuver your car and apply brakes. e. Dennis Bast may have sexual intercourse  when it is comfortable.  8. FOLLOW UP in our office a. Please call [facility] at [phone] to set up an appointment to see your surgeon in the office for a follow-up appointment approximately 2-3 weeks after your surgery. b. Make sure that you call for this appointment the day you arrive home to insure a convenient appointment time. 9.  IF YOU HAVE DISABILITY OR FAMILY LEAVE FORMS, BRING THEM TO THE OFFICE FOR PROCESSING.  DO NOT GIVE THEM TO YOUR DOCTOR.  WHEN TO CALL us [phone]: 1. Poor pain control 2. Reactions / problems with new medications (rash/itching, nausea, etc)  3. Fever over 101.5 F (38.5 C) 4. Inability to urinate 5. Nausea and/or vomiting 6. Worsening swelling or bruising 7. Continued bleeding from incision. 8. Increased pain, redness, or drainage from the incision   The clinic staff is available to answer your questions during regular business hours (8:30am-5pm).  Please dont hesitate to call and ask to speak to one of our nurses for clinical concerns.   If you have a medical emergency, go to the nearest emergency room or call 911.  A surgeon from [facility] is always on call at the hospitals in [facility], [place], [place], [place], [place]  [address] ?  [address] [address], [place], [place]   [address] MAIN: [phone] ? TOLL FREE: [phone] ? FAX: [phone] [email or web address]  GETTING TO GOOD BOWEL HEALTH. Irregular bowel habits such as constipation and diarrhea can lead to many problems over time.  Having one soft bowel movement a day is the most important way to prevent further problems.  The anorectal canal is designed to handle stretching and feces to safely manage our ability to get rid of solid waste (feces, poop, stool) out of our body.  BUT, hard constipated stools can act like ripping concrete bricks and diarrhea can be a burning fire to this very sensitive area of our body, causing  inflamed hemorrhoids, anal fissures, increasing risk is perirectal abscesses, abdominal pain/bloating, an making irritable bowel worse.      The goal: ONE SOFT BOWEL MOVEMENT A DAY!  To have soft, regular bowel movements:   Drink plenty of fluids, consider 4-6 tall glasses of water a day.    Take plenty of fiber.  Fiber is the undigested part of plant food that passes into the colon, acting s natures broom to encourage bowel motility and movement.  Fiber can absorb and hold large amounts of water. This results in a larger, bulkier stool, which is soft and easier to pass. Work gradually over several weeks up to 6 servings a day of fiber (25g a day even more if needed) in the form of: o Vegetables -- Root (potatoes, carrots, turnips), leafy green (lettuce, salad greens, celery, spinach), or cooked high residue (cabbage, broccoli, etc) o Fruit -- Fresh (unpeeled skin & pulp), Dried (prunes, apricots, cherries, etc ),  or stewed ( applesauce)  o Whole grain breads, pasta, etc (whole wheat)  o Bran cereals  Bulking Agents -- This type of water-retaining fiber generally is easily obtained each day by one of the following:  o Psyllium bran -- The psyllium plant is remarkable because its ground seeds can retain so much water. This product is available as Metamucil, Konsyl, Effersyllium, Per Diem Fiber, or the less expensive generic preparation in drug and health food stores. Although labeled a laxative, it really is not a laxative.  o Methylcellulose -- This is another fiber derived from wood which also retains water. It is available as Citrucel. o Polyethylene Glycol - and artificial fiber commonly called Miralax or Glycolax.  It is helpful for people with gassy or bloated feelings with regular fiber o Flax Seed - a less gassy fiber than psyllium  No reading or other relaxing activity while on the toilet. If bowel movements take longer than 5 minutes, you are too constipated  AVOID CONSTIPATION.   High fiber and water intake usually takes care of this.  Sometimes a laxative is needed to stimulate more frequent bowel movements, but   Laxatives are not a good long-term solution as it can wear the colon out.  They can help jump-start bowels if constipated, but should be relied on constantly without discussing with your doctor o Osmotics (Milk of Magnesia, Fleets phosphosoda, Magnesium citrate, MiraLax, GoLytely) are safer than  o Stimulants (Senokot, Castor Oil, Dulcolax, Ex Lax)    o Avoid taking laxatives for more than 7 days in a row.   IF SEVERELY CONSTIPATED, try a Bowel Retraining Program: o Do not use laxatives.  o Eat a diet high in roughage, such as bran cereals and leafy vegetables.  o Drink six (6) ounces of prune or apricot juice each morning.  o Eat two (2) large servings of stewed fruit each day.  o Take one (1) heaping tablespoon of a psyllium-based bulking agent twice a day. Use sugar-free sweetener when possible to avoid excessive calories.  o Eat a normal breakfast.  o Set aside 15 minutes after breakfast to sit on the toilet, but do not strain to have a bowel movement.  o If you do not have a bowel movement by the third day, use an enema and repeat the above steps.   Controlling diarrhea o Switch to liquids and simpler foods for a few days to avoid stressing your intestines further. o Avoid dairy products (especially milk & ice cream) for a short time.  The intestines often can lose the ability to digest lactose when stressed. o Avoid foods that cause gassiness or bloating.  Typical foods include beans and other legumes, cabbage, broccoli, and dairy foods.  Every person has some sensitivity to other foods, so listen to our body and avoid those foods that trigger problems for you. o Adding fiber (Citrucel, Metamucil, psyllium, Miralax) gradually can help thicken stools by absorbing excess fluid and retrain the intestines to act more normally.  Slowly increase the dose over  a few weeks.  Too much fiber too soon can backfire and cause cramping & bloating. o Probiotics (such as active yogurt, Align, etc) may help repopulate the intestines and colon with normal bacteria and calm down a sensitive digestive tract.  Most studies show it to be of mild help, though, and such products can be costly. o Medicines: - Bismuth subsalicylate (ex. Kayopectate, Pepto Bismol) every 30 minutes for up to 6 doses can help control diarrhea.  Avoid if pregnant. - Loperamide (Immodium) can slow down diarrhea.  Start with two tablets (23m total) first  and then try one tablet every 6 hours.  Avoid if you are having fevers or severe pain.  If you are not better or start feeling worse, stop all medicines and call your doctor for advice o Call your doctor if you are getting worse or not better.  Sometimes further testing (cultures, endoscopy, X-ray studies, bloodwork, etc) may be needed to help diagnose and treat the cause of the diarrhea.  TROUBLESHOOTING IRREGULAR BOWELS 1) Avoid extremes of bowel movements (no bad constipation/diarrhea) 2) Miralax 17gm mixed in 8oz. water or juice-daily. May use BID as needed.  3) Gas-x,Phazyme, etc. as needed for gas & bloating.  4) Soft,bland diet. No spicy,greasy,fried foods.  5) Prilosec over-the-counter as needed  6) May hold gluten/wheat products from diet to see if symptoms improve.  7)  May try probiotics (Align, Activa, etc) to help calm the bowels down 7) If symptoms become worse call back immediately.  Managing Pain  Pain after surgery or related to activity is often due to strain/injury to muscle, tendon, nerves and/or incisions.  This pain is usually short-term and will improve in a few months.   Many people find it helpful to do the following things TOGETHER to help speed the process of healing and to get back to regular activity more quickly:  1. Avoid heavy physical activity at first a. No lifting greater than 20 pounds at first, then  increase to lifting as tolerated over the next few weeks b. Do not push through the pain.  Listen to your body and avoid positions and maneuvers than reproduce the pain.  Wait a few days before trying something more intense c. Walking is okay as tolerated, but go slowly and stop when getting sore.  If you can walk 30 minutes without stopping or pain, you can try more intense activity (running, jogging, aerobics, cycling, swimming, treadmill, sex, sports, weightlifting, etc ) d. Remember: If it hurts to do it, then dont do it!  2. Take Anti-inflammatory medication a. Choose ONE of the following over-the-counter medications: i.            Acetaminophen 500mg  tabs (Tylenol) 1-2 pills with every meal and just before bedtime (avoid if you have liver problems) ii.            Naproxen 220mg  tabs (ex. Aleve) 1-2 pills twice a day (avoid if you have kidney, stomach, IBD, or bleeding problems) iii. Ibuprofen 200mg  tabs (ex. Advil, Motrin) 3-4 pills with every meal and just before bedtime (avoid if you have kidney, stomach, IBD, or bleeding problems) b. Take with food/snack around the clock for 1-2 weeks i. This helps the muscle and nerve tissues become less irritable and calm down faster  3. Use a Heating pad or Ice/Cold Pack a. 4-6 times a day b. May use warm bath/hottub  or showers  4. Try Gentle Massage and/or Stretching  a. at the area of pain many times a day b. stop if you feel pain - do not overdo it  Try these steps together to help you body heal faster and avoid making things get worse.  Doing just one of these things may not be enough.    If you are not getting better after two weeks or are noticing you are getting worse, contact our office for further advice; we may need to re-evaluate you & see what other things we can do to help.  Hernia, Adult A hernia is the bulging of an organ or tissue through a weak spot  in the muscles of the abdomen (abdominal wall). Hernias develop most often  near the navel or groin. There are many kinds of hernias. Common kinds include:  Femoral hernia. This kind of hernia develops under the groin in the upper thigh area.  Inguinal hernia. This kind of hernia develops in the groin or scrotum.  Umbilical hernia. This kind of hernia develops near the navel.  Hiatal hernia. This kind of hernia causes part of the stomach to be pushed up into the chest.  Incisional hernia. This kind of hernia bulges through a scar from an abdominal surgery. CAUSES This condition may be caused by:  Heavy lifting.  Coughing over a long period of time.  Straining to have a bowel movement.  An incision made during an abdominal surgery.  A birth defect (congenital defect).  Excess weight or obesity.  Smoking.  Poor nutrition.  Cystic fibrosis.  Excess fluid in the abdomen.  Undescended testicles. SYMPTOMS Symptoms of a hernia include:  A lump on the abdomen. This is the first sign of a hernia. The lump may become more obvious with standing, straining, or coughing. It may get bigger over time if it is not treated or if the condition causing it is not treated.  Pain. A hernia is usually painless, but it may become painful over time if treatment is delayed. The pain is usually dull and may get worse with standing or lifting heavy objects. Sometimes a hernia gets tightly squeezed in the weak spot (strangulated) or stuck there (incarcerated) and causes additional symptoms. These symptoms may include:  Vomiting.  Nausea.  Constipation.  Irritability. DIAGNOSIS A hernia may be diagnosed with:  A physical exam. During the exam your health care provider may ask you to cough or to make a specific movement, because a hernia is usually more visible when you move.  Imaging tests. These can include:  X-rays.  Ultrasound.  CT scan. TREATMENT A hernia that is small and painless may not need to be treated. A hernia that is large or painful may be  treated with surgery. Inguinal hernias may be treated with surgery to prevent incarceration or strangulation. Strangulated hernias are always treated with surgery, because lack of blood to the trapped organ or tissue can cause it to die. Surgery to treat a hernia involves pushing the bulge back into place and repairing the weak part of the abdomen. HOME CARE INSTRUCTIONS  Avoid straining.  Do not lift anything heavier than 10 lb (4.5 kg).  Lift with your leg muscles, not your back muscles. This helps avoid strain.  When coughing, try to cough gently.  Prevent constipation. Constipation leads to straining with bowel movements, which can make a hernia worse or cause a hernia repair to break down. You can prevent constipation by:  Eating a high-fiber diet that includes plenty of fruits and vegetables.  Drinking enough fluids to keep your urine clear or pale yellow. Aim to drink 6-8 glasses of water per day.  Using a stool softener as directed by your health care provider.  Lose weight, if you are overweight.  Do not use any tobacco products, including cigarettes, chewing tobacco, or electronic cigarettes. If you need help quitting, ask your health care provider.  Keep all follow-up visits as directed by your health care provider. This is important. Your health care provider may need to monitor your condition. SEEK MEDICAL CARE IF:  You have swelling, redness, and pain in the affected area.  Your bowel habits change. SEEK IMMEDIATE  MEDICAL CARE IF:  You have a fever.  You have abdominal pain that is getting worse.  You feel nauseous or you vomit.  You cannot push the hernia back in place by gently pressing on it while you are lying down.  The hernia:  Changes in shape or size.  Is stuck outside the abdomen.  Becomes discolored.  Feels hard or tender.   This information is not intended to replace advice given to you by your health care provider. Make sure you discuss any  questions you have with your health care provider.   Document Released: 12/03/2005 Document Revised: 12/24/2014 Document Reviewed: 10/13/2014 [employer] Interactive Patient Education [employer].
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
## 2016-02-09 NOTE — Transfer of Care (Signed)
Immediate Anesthesia Transfer of Care Note  Patient: Kenneth Davila  Procedure(s) Performed: Procedure(s): LAPAROSCOPIC REPAIR INCARCERATED INCISIONAL HERNIA  WITH MESH, BILATERAL INGUINAL HERNIA REPAIR WITH MESH, EXCISION RIGHT ILIAC LYMPH NODE (N/A) INSERTION OF MESH (N/A)  Patient Location: PACU  Anesthesia Type:General  Level of Consciousness: awake, alert  and oriented  Airway & Oxygen Therapy: Patient Spontanous Breathing and Patient connected to face mask oxygen  Post-op Assessment: Report given to RN and Post -op Vital signs reviewed and stable  Post vital signs: Reviewed and stable  Last Vitals:  Filed Vitals:   02/09/16 0517  BP: 128/79  Pulse: 71  Temp: 36.6 C  Resp: 18    Complications: No apparent anesthesia complications

## 2016-02-09 NOTE — Op Note (Signed)
02/09/2016  11:31 AM  PATIENT:  Kenneth Davila  60 y.o. male  Patient Care Team: Dorena Dew, FNP as PCP - General (Family Medicine) Michael Boston, MD as Consulting Physician (General Surgery)  PRE-OPERATIVE DIAGNOSIS:    Incarcerated incisional ventral wall abdominal wall hernia.  Left inguinal hernia possible right inguinal hernia  POST-OPERATIVE DIAGNOSIS:    Incarcerated incisional ventral wall abdominal wall hernias.  Bilateral inguinal hernias Right iliac lymphadenopathy  PROCEDURE:  LAPAROSCOPIC REPAIR INCARCERATED INCISIONAL HERNIA WITH MESH LAPAROSCOPIC, BILATERAL INGUINAL HERNIA REPAIR WITH MESH LAPAROSCOPIC LYSIS OF ADHESIONS EXCISION RIGHT ILIAC LYMPH NODE INSERTION OF MESH  SURGEON:  Surgeon(s): Michael Boston, MD  ASSISTANT: RN   ANESTHESIA:   local and general  EBL:     Delay start of Pharmacological VTE agent (>24hrs) due to surgical blood loss or risk of bleeding:  no  DRAINS: none   SPECIMEN:  Source of Specimen:  RIGHT ILIAC LYMPH NODE  DISPOSITION OF SPECIMEN:  PATHOLOGY  COUNTS:  YES  PLAN OF CARE: Admit for overnight observation  PATIENT DISPOSITION:  PACU - hemodynamically stable.  INDICATION: Pleasant patient has developed a ventral wall abdominal hernia.  Also with left inguinal hernia,  Possible right.  H/o atrophic/undescended right testicle.  Recommendation was made for surgical repair:  The anatomy & physiology of the abdominal wall was discussed. The pathophysiology of hernias was discussed. Natural history risks without surgery including progeressive enlargement, pain, incarceration & strangulation was discussed. Contributors to complications such as smoking, obesity, diabetes, prior surgery, etc were discussed.  I feel the risks of no intervention will lead to serious problems that outweigh the operative risks; therefore, I recommended surgery to reduce and repair the hernia. I explained laparoscopic techniques with possible  need for an open approach. I noted the probable use of mesh to patch and/or buttress the hernia repair  Risks such as bleeding, infection, abscess, need for further treatment, heart attack, death, and other risks were discussed. I noted a good likelihood this will help address the problem. Goals of post-operative recovery were discussed as well. Possibility that this will not correct all symptoms was explained. I stressed the importance of low-impact activity, aggressive pain control, avoiding constipation, & not pushing through pain to minimize risk of post-operative chronic pain or injury. Possibility of reherniation especially with smoking, obesity, diabetes, immunosuppression, and other health conditions was discussed. We will work to minimize complications.  An educational handout further explaining the pathology & treatment options was given as well. Questions were answered. The patient expresses understanding & wishes to proceed with surgery.   OR FINDINGS: 11x5 cm region of Swiss cheese hernias, incarcerated with mainly omentum - some transverse colon.   Type of repair - Laparoscopic underlay repair   Name of mesh - Bard Ventralight dual sided (polypropylene / Seprafilm)  Size of mesh - Hieght 25 cm, Width 20 cm  Mesh overlap - 5-7 cm  Placement of mesh - Intraperitoneal underlay repair  Bilateral inguinal hernias.  Large left indirect inguinal hernia.  Smaller right direct space hernia.  Atrophic right vas deferens consistent with atrophic right testicle.  Mild lymphadenopathy along the right iliac region.  Excisional biopsy made of one of the lymph nodes.   DESCRIPTION:   Informed consent was confirmed. The patient underwent general anaesthesia without difficulty. The patient was positioned appropriately. VTE prevention in place. The patient's abdomen was clipped, prepped, & draped in a sterile fashion. Surgical timeout confirmed our plan.  The patient was positioned in  reverse  Trendelenburg. Abdominal entry was gained using optical entry technique in the right upper abdomen. Entry was clean. I induced carbon dioxide insufflation. Camera inspection revealed no injury. Extra ports were carefully placed under direct laparoscopic visualization.   Patient had adhesions of greater omentum and transverse colon to the anterior abdominal wall.  These were carefully released until we could find the Swiss cheese hernias in the central abdomen.  I was able to reduce greater omentum out of the smaller Swiss cheese hernias.  There was a 3 cm defect with  Incarcerated with a large volume of omentum and a knuckle of transverse colon as well.  This was carefully reduced using careful blunt and external pressure.  Eventually able to reduce all the hernias down.  I did laparoscopic lysis of adhesions to expose the entire anterior abdominal wall.  I primarily used and focused cold scissors.   I could see obvious hernia in the left groin.  Suspicion for a direct space hernia on the right side as well.  I decided to proceed with TEP repair of the inguinal hernias.   I made a transverse incision through the inferior umbilical fold.  I made a small transverse nick through the anterior rectus fascia contralateral to the inguinal hernia side and placed a 0-vicryl stitch through the fascia.  I placed a Hasson trocar into the preperitoneal plane.  Entry was clean.  We induced carbon dioxide insufflation. Camera inspection revealed no injury.  I used a 96mm angled scope to bluntly free the peritoneum off the infraumbilical anterior abdominal wall.  I created enough of a preperitoneal pocket to place 89mm ports into the right & left mid-abdomen into this preperitoneal cavity.  I focused attention on the left side since that was the dominant hernia side.   I used blunt & focused sharp dissection to free the peritoneum off the flank and down to the pubic rim.  I freed the anteriolateral bladder wall off the  anteriolateral pelvic wall, sparing midline attachments.   I located a swath of peritoneum going into a hernia fascial defect at the internal ring consistent with an indirect inguinal hernia.  I gradually freed the peritoneal hernia sac off safely and reduced it into the preperitoneal space.  I freed the peritoneum off the spermatic vessels & vas deferens.  I freed peritoneum off the retroperitoneum along the psoas muscle.    I was able to skeletonize excise in later remove spermatic cord lipomas on the left side.  After high ligation of the hernia sac to help close the peritoneum better down.  Did that with 3-0 Vicryl suture intracorporeally.  I checked & assured hemostasis.    I turned attention on the opposite side.  I did dissection in a similar, mirror-image fashion. The patient had a Subtle direct hernia.  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  No real pedicle either.  Eventually reduced out some spermatic cord lipomas and remove this as well.  Patient had some enlarged lymph nodes near the femoral canal.   I did free some of those off and sent those off for specimen.  No evidence of any femoral hernia..  Did high ligation of hernia sac on this side as well    I chose 15x15 cm sheets of ultra-lightweight polypropylene mesh (Ultrapro), one for each side.  I cut a single sigmoid-shaped slit ~6cm from a corner of each mesh.  I placed the meshes into the preperitoneal space &  laid them as overlapping diamonds such that at the inferior points, a 6x6 cm corner flap rested in the true anterolateral pelvis, covering the obturator & femoral foramina.   I allowed the bladder to return to the pubis, this helping tuck the corners of the mesh in the anteriolateral pelvis.  The medial corners overlapped each other across midline cephalad to the pubic rim.   This provided >2 inch coverage around the hernia.  Because the defects well covered and not particularly large, I  did not place any tacks.  I evacuated preperitoneal carbon dioxide.  I reinsufflated the true perineal cavity.  I redirected 5 ports along the right side of the abdomen again.  Omentum and colon looked viable with evidence of any necrosis or bleeding.  Hemostasis was good.  I mapped out the region using a needle passer.   To ensure that I would have at least 5 cm radial coverage outside of the hernia defect, I chose a 25x20 cm dual sided mesh.  I placed #1 Prolene stitches around its edge about every 5 cm = 14 total.  I rolled the mesh & placed into the peritoneal cavity through the Larger hernia defect supraumbilically after freeing off the umbilical stalk.  I then primarily closed the largest hernia defect using #1 PDS interrupted transversely.  I closed the son infraumbilical port site using 0 Vicryl suture as well transversely.  We reintroduced carbon dioxide insufflation.  I unrolled the 25x20 mesh and positioned it vertically.  I secured the mesh to cover up the midline hernia defects using a laparoscopic suture passer to pass the tails of the Prolene through the abdominal wall & tagged them with clamps.  I started out in four corners to make sure I had the mesh centered under the hernia defect appropriately, and then proceeded to work in quadrants.  We evacuated CO2 & desufflated the abdomen.  I tied the fascial stitches down.  I reinsufflated the abdomen.  The mesh provided at least 5-7 cm circumferential coverage around the entire region of hernia defects.   Also used #1 Prolene suture through the  Center part of the fascia to help tack the mesh centrally 2 using a laparoscopic suture passer.  I tacked the edges & central part of the mesh to the peritoneum/posterior rectus fascia with SecureStrap absorbable tacks. Hemostasis was excellent.  I did reinspection. Hemostasis was good. Mesh laid well. Capnoperitoneum was evacuated. Ports were removed. The umbilical stalk was re-tacked down to help close  some of the dead space of the left paramedian hernia sac.The skin was closed with Monocryl at the port sites and Steri-Strips on the fascial stitch puncture sites.  Patient is being extubated to go to the recovery room. I updated the status of the patient to the patient's family.  I made recommendations.  I answered questions.  Understanding & appreciation was expressed.  Instructions are written in the chart.  He will the amount of hernias had to be repaired, expect he will need to stay overnight.  He was hoping to leave tonight.  We will see.  Adin Hector, M.D., F.A.C.S. Gastrointestinal and Minimally Invasive Surgery [facility] [address], [place], [place] [id number] [phone] Main / Paging

## 2016-02-10 ENCOUNTER — Encounter (HOSPITAL_COMMUNITY): Payer: Self-pay | Admitting: Surgery

## 2016-02-10 DIAGNOSIS — K430 Incisional hernia with obstruction, without gangrene: Secondary | ICD-10-CM | POA: Diagnosis not present

## 2016-02-10 MED ORDER — HYDROCODONE-ACETAMINOPHEN 5-325 MG PO TABS: 1.0000 | ORAL_TABLET | ORAL | Status: DC | PRN
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
## 2016-02-10 NOTE — Discharge Summary (Signed)
Pt d/ced home with family member, VSS, NAD

## 2016-02-21 ENCOUNTER — Encounter: Payer: Self-pay | Admitting: Family Medicine

## 2016-02-21 ENCOUNTER — Ambulatory Visit (INDEPENDENT_AMBULATORY_CARE_PROVIDER_SITE_OTHER): Payer: Medicaid Other | Admitting: Family Medicine

## 2016-02-21 VITALS — BP 127/70 | HR 76 | Temp 98.0°F | Resp 16 | Ht 74.0 in | Wt 251.0 lb

## 2016-02-21 DIAGNOSIS — Z9889 Other specified postprocedural states: Secondary | ICD-10-CM | POA: Diagnosis not present

## 2016-02-21 DIAGNOSIS — R7303 Prediabetes: Secondary | ICD-10-CM

## 2016-02-21 DIAGNOSIS — F17200 Nicotine dependence, unspecified, uncomplicated: Secondary | ICD-10-CM | POA: Diagnosis not present

## 2016-02-21 DIAGNOSIS — G44329 Chronic post-traumatic headache, not intractable: Secondary | ICD-10-CM | POA: Diagnosis not present

## 2016-02-21 DIAGNOSIS — Z8719 Personal history of other diseases of the digestive system: Secondary | ICD-10-CM

## 2016-02-21 DIAGNOSIS — Z8659 Personal history of other mental and behavioral disorders: Secondary | ICD-10-CM
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
## 2016-02-21 NOTE — Patient Instructions (Signed)

## 2016-03-29 ENCOUNTER — Telehealth: Payer: Self-pay

## 2016-04-06 ENCOUNTER — Telehealth: Payer: Self-pay

## 2016-04-10 ENCOUNTER — Encounter (HOSPITAL_COMMUNITY): Payer: Self-pay | Admitting: *Deleted

## 2016-04-10 ENCOUNTER — Ambulatory Visit (HOSPITAL_COMMUNITY)
Admission: EM | Admit: 2016-04-10 | Discharge: 2016-04-10 | Disposition: A | Discharge status: Home / Self Care | Payer: Medicaid Other | Attending: Family Medicine | Admitting: Family Medicine

## 2016-04-10 DIAGNOSIS — M545 Low back pain: Secondary | ICD-10-CM

## 2016-04-10 DIAGNOSIS — M25511 Pain in right shoulder: Secondary | ICD-10-CM | POA: Diagnosis not present

## 2016-04-10 DIAGNOSIS — R519 Headache, unspecified: Secondary | ICD-10-CM

## 2016-04-10 DIAGNOSIS — R51 Headache: Secondary | ICD-10-CM

## 2016-04-10 MED ORDER — TRAMADOL HCL 50 MG PO TABS: 50.0000 mg | ORAL_TABLET | Freq: Four times a day (QID) | ORAL | Status: DC | PRN

## 2016-04-10 NOTE — Discharge Instructions (Signed)
Recurrent Migraine Headache A migraine headache is very bad, throbbing pain on one or both sides of your head. Recurrent migraines keep coming back. Talk to your doctor about what things may bring on (trigger) your migraine headaches. HOME CARE  Only take medicines as told by your doctor.  Lie down in a dark, quiet room when you have a migraine.  Keep a journal to find out if certain things bring on migraine headaches. For example, write down:  What you eat and drink.  How much sleep you get.  Any change to your diet or medicines.  Lessen how much alcohol you drink.  Quit smoking if you smoke.  Get enough sleep.  Lessen any stress in your life.  Keep lights dim if bright lights bother you or make your migraines worse. GET HELP IF:  Medicine does not help your migraines.  Your pain keeps coming back.  You have a fever. GET HELP RIGHT AWAY IF:   Your migraine becomes really bad.  You have a stiff neck.  You have trouble seeing.  Your muscles are weak, or you lose muscle control.  You lose your balance or have trouble walking.  You feel like you will pass out (faint), or you pass out.  You have really bad symptoms that are different than your first symptoms. MAKE SURE YOU:   Understand these instructions.  Will watch your condition.  Will get help right away if you are not doing well or get worse.   This information is not intended to replace advice given to you by your health care provider. Make sure you discuss any questions you have with your health care provider.   Document Released: 09/11/2008 Document Revised: 12/08/2013 Document Reviewed: 08/10/2013 Elsevier Interactive Patient Education 2016 Elsevier Inc. Back Pain, Adult Back pain is very common. The pain often gets better over time. The cause of back pain is usually not dangerous. Most people can learn to manage their back pain on their own.  HOME CARE  Watch your back pain for any changes. The  following actions may help to lessen any pain you are feeling:  Stay active. Start with short walks on flat ground if you can. Try to walk farther each day.  Exercise regularly as told by your doctor. Exercise helps your back heal faster. It also helps avoid future injury by keeping your muscles strong and flexible.  Do not sit, drive, or stand in one place for more than 30 minutes.  Do not stay in bed. Resting more than 1-2 days can slow down your recovery.  Be careful when you bend or lift an object. Use good form when lifting:  Bend at your knees.  Keep the object close to your body.  Do not twist.  Sleep on a firm mattress. Lie on your side, and bend your knees. If you lie on your back, put a pillow under your knees.  Take medicines only as told by your doctor.  Put ice on the injured area.  Put ice in a plastic bag.  Place a towel between your skin and the bag.  Leave the ice on for 20 minutes, 2-3 times a day for the first 2-3 days. After that, you can switch between ice and heat packs.  Avoid feeling anxious or stressed. Find good ways to deal with stress, such as exercise.  Maintain a healthy weight. Extra weight puts stress on your back. GET HELP IF:   You have pain that does not go away  with rest or medicine.  You have worsening pain that goes down into your legs or buttocks.  You have pain that does not get better in one week.  You have pain at night.  You lose weight.  You have a fever or chills. GET HELP RIGHT AWAY IF:   You cannot control when you poop (bowel movement) or pee (urinate).  Your arms or legs feel weak.  Your arms or legs lose feeling (numbness).  You feel sick to your stomach (nauseous) or throw up (vomit).  You have belly (abdominal) pain.  You feel like you may pass out (faint).   This information is not intended to replace advice given to you by your health care provider. Make sure you discuss any questions you have with your  health care provider.   Document Released: 05/21/2008 Document Revised: 12/24/2014 Document Reviewed: 04/06/2014 Elsevier Interactive Patient Education [employer].

## 2016-04-10 NOTE — ED Notes (Signed)
Unable to access the medication portion of the triage.  Pt is only taking aleve BID, last taken today

## 2016-04-10 NOTE — ED Notes (Signed)
Yesterday a metal piece of furniture fell onto his left side.  He c/o pain LLQ, lower back, left thigh, right knee,  Left hand and wrist     He had hernia surgery 2 months ago for ventral, umbilical  and inguinal hernias
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
## 2016-08-23 ENCOUNTER — Ambulatory Visit: Payer: Medicaid Other | Admitting: Family Medicine

## 2016-11-07 NOTE — Telephone Encounter (Signed)
ERROR

## 2016-11-29 ENCOUNTER — Encounter: Payer: Self-pay | Admitting: Family Medicine

## 2016-11-29 ENCOUNTER — Ambulatory Visit (INDEPENDENT_AMBULATORY_CARE_PROVIDER_SITE_OTHER): Payer: Medicaid Other | Admitting: Family Medicine

## 2016-11-29 ENCOUNTER — Ambulatory Visit (HOSPITAL_COMMUNITY)
Admission: RE | Admit: 2016-11-29 | Discharge: 2016-11-29 | Disposition: A | Discharge status: Home / Self Care | Payer: Medicaid Other | Source: Ambulatory Visit | Attending: Family Medicine | Admitting: Family Medicine

## 2016-11-29 VITALS — BP 143/72 | HR 76 | Temp 98.5°F | Resp 16 | Ht 74.0 in | Wt 274.0 lb

## 2016-11-29 DIAGNOSIS — G44329 Chronic post-traumatic headache, not intractable: Secondary | ICD-10-CM | POA: Diagnosis not present

## 2016-11-29 DIAGNOSIS — M533 Sacrococcygeal disorders, not elsewhere classified: Secondary | ICD-10-CM

## 2016-11-29 DIAGNOSIS — F17200 Nicotine dependence, unspecified, uncomplicated: Secondary | ICD-10-CM

## 2016-11-29 DIAGNOSIS — Z8659 Personal history of other mental and behavioral disorders: Secondary | ICD-10-CM

## 2016-11-29 DIAGNOSIS — F329 Major depressive disorder, single episode, unspecified: Secondary | ICD-10-CM | POA: Diagnosis not present

## 2016-11-29 DIAGNOSIS — R7303 Prediabetes: Secondary | ICD-10-CM

## 2016-11-29 DIAGNOSIS — K219 Gastro-esophageal reflux disease without esophagitis: Secondary | ICD-10-CM

## 2016-11-29 DIAGNOSIS — F32A Depression, unspecified: Secondary | ICD-10-CM

## 2016-11-29 DIAGNOSIS — M25532 Pain in left wrist: Secondary | ICD-10-CM

## 2016-11-29 LAB — POCT GLYCOSYLATED HEMOGLOBIN (HGB A1C): HEMOGLOBIN A1C: 5.6

## 2016-11-29 MED ORDER — B & B CARPAL TUNNEL BRACE MISC: 1.0000 | Freq: Every day | 0 refills | Status: DC

## 2016-11-29 MED ORDER — RANITIDINE HCL 150 MG PO TABS: 150.0000 mg | ORAL_TABLET | Freq: Two times a day (BID) | ORAL | 5 refills | Status: DC | PRN

## 2016-11-29 MED ORDER — DULOXETINE HCL 30 MG PO CPEP: 30.0000 mg | ORAL_CAPSULE | Freq: Every day | ORAL | 6 refills | Status: DC
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
## 2016-11-29 NOTE — Patient Instructions (Addendum)
Food Choices for Gastroesophageal Reflux Disease, Adult When you have gastroesophageal reflux disease (GERD), the foods you eat and your eating habits are very important. Choosing the right foods can help ease your discomfort. What guidelines do I need to follow?  Choose fruits, vegetables, whole grains, and low-fat dairy products.  Choose low-fat meat, fish, and poultry.  Limit fats such as oils, salad dressings, butter, nuts, and avocado.  Keep a food diary. This helps you identify foods that cause symptoms.  Avoid foods that cause symptoms. These may be different for everyone.  Eat small meals often instead of 3 large meals a day.  Eat your meals slowly, in a place where you are relaxed.  Limit fried foods.  Cook foods using methods other than frying.  Avoid drinking alcohol.  Avoid drinking large amounts of liquids with your meals.  Avoid bending over or lying down until 2-3 hours after eating. What foods are not recommended? These are some foods and drinks that may make your symptoms worse: Vegetables  Tomatoes. Tomato juice. Tomato and spaghetti sauce. Chili peppers. Onion and garlic. Horseradish. Fruits  Oranges, grapefruit, and lemon (fruit and juice). Meats  High-fat meats, fish, and poultry. This includes hot dogs, ribs, ham, sausage, salami, and bacon. Dairy  Whole milk and chocolate milk. Sour cream. Cream. Butter. Ice cream. Cream cheese. Drinks  Coffee and tea. Bubbly (carbonated) drinks or energy drinks. Condiments  Hot sauce. Barbecue sauce. Sweets/Desserts  Chocolate and cocoa. Donuts. Peppermint and spearmint. Fats and Oils  High-fat foods. This includes [place] fries and potato chips. Other  Vinegar. Strong spices. This includes black pepper, white pepper, red pepper, cayenne, curry powder, cloves, ginger, and chili powder. The items listed above may not be a complete list of foods and drinks to avoid. Contact your dietitian for more information.    This information is not intended to replace advice given to you by your health care provider. Make sure you discuss any questions you have with your health care provider. Document Released: 06/03/2012 Document Revised: 05/10/2016 Document Reviewed: 10/07/2013 Elsevier Interactive Patient Education  2017 [place] Syndrome Introduction Carpal tunnel syndrome is a condition that causes pain in your hand and arm. The carpal tunnel is a narrow area that is on the palm side of your wrist. Repeated wrist motion or certain diseases may cause swelling in the tunnel. This swelling can pinch the main nerve in the wrist (median nerve). Follow these instructions at home: If you have a splint:  Wear it as told by your doctor. Remove it only as told by your doctor.  Loosen the splint if your fingers:  Become numb and tingle.  Turn blue and cold.  Keep the splint clean and dry. General instructions  Take over-the-counter and prescription medicines only as told by your doctor.  Rest your wrist from any activity that may be causing your pain. If needed, talk to your employer about changes that can be made in your work, such as getting a wrist pad to use while typing.  If directed, apply ice to the painful area:  Put ice in a plastic bag.  Place a towel between your skin and the bag.  Leave the ice on for 20 minutes, 2-3 times per day.  Keep all follow-up visits as told by your doctor. This is important.  Do any exercises as told by your doctor, physical therapist, or occupational therapist. Contact a doctor if:  You have new symptoms.  Medicine does not  help your pain.  Your symptoms get worse. This information is not intended to replace advice given to you by your health care provider. Make sure you discuss any questions you have with your health care provider. Document Released: 11/22/2011 Document Revised: 05/10/2016 Document Reviewed: 04/20/2015  2017  Elsevier

## 2016-11-30 ENCOUNTER — Telehealth: Payer: Self-pay | Admitting: *Deleted

## 2016-11-30 NOTE — Telephone Encounter (Signed)
-----   Message from Dorena Dew, [place] sent at 11/29/2016  6:05 PM EST ----- Regarding: xray results Please inform Kenneth Davila that xray of sacrum was unremarkable. There were no fractures or degenerative changes noted. Please apply warm, moist compresses 20 minutes 4 times per days as needed. Follow up in office as previously scheduled.   Thanks ----- Message ----- From: Interface, Rad Results In Sent: 11/29/2016   9:55 AM To: Dorena Dew, FNP

## 2016-11-30 NOTE — Telephone Encounter (Signed)
Patient verified DOB Patient is aware of xray showing no degenerative changes or fractures. Patient advised use a hot compress on the area 4 times a day for 20 minutes at a time. Patient advised to keep scheduled appointment. Patient expressed his understanding and had no further questions at this time.

## 2017-03-24 ENCOUNTER — Emergency Department (HOSPITAL_COMMUNITY): Payer: Medicaid Other

## 2017-03-24 ENCOUNTER — Emergency Department (HOSPITAL_COMMUNITY)
Admission: EM | Admit: 2017-03-24 | Discharge: 2017-03-24 | Disposition: A | Discharge status: Home / Self Care | Payer: Medicaid Other | Attending: Emergency Medicine | Admitting: Emergency Medicine

## 2017-03-24 ENCOUNTER — Encounter (HOSPITAL_COMMUNITY): Payer: Self-pay

## 2017-03-24 DIAGNOSIS — Z79899 Other long term (current) drug therapy: Secondary | ICD-10-CM | POA: Insufficient documentation

## 2017-03-24 DIAGNOSIS — E119 Type 2 diabetes mellitus without complications: Secondary | ICD-10-CM | POA: Insufficient documentation

## 2017-03-24 DIAGNOSIS — F17210 Nicotine dependence, cigarettes, uncomplicated: Secondary | ICD-10-CM | POA: Insufficient documentation

## 2017-03-24 DIAGNOSIS — R1031 Right lower quadrant pain: Secondary | ICD-10-CM

## 2017-03-24 DIAGNOSIS — J45909 Unspecified asthma, uncomplicated: Secondary | ICD-10-CM | POA: Diagnosis not present

## 2017-03-24 DIAGNOSIS — K5900 Constipation, unspecified: Secondary | ICD-10-CM | POA: Diagnosis not present

## 2017-03-24 DIAGNOSIS — I10 Essential (primary) hypertension: Secondary | ICD-10-CM | POA: Insufficient documentation

## 2017-03-24 LAB — COMPREHENSIVE METABOLIC PANEL
ALK PHOS: 82 U/L (ref 38–126)
ALT: 19 U/L (ref 17–63)
AST: 26 U/L (ref 15–41)
Albumin: 3.4 g/dL — ABNORMAL LOW (ref 3.5–5.0)
Anion gap: 5 (ref 5–15)
BILIRUBIN TOTAL: 0.2 mg/dL — AB (ref 0.3–1.2)
BUN: 11 mg/dL (ref 6–20)
CALCIUM: 9 mg/dL (ref 8.9–10.3)
CO2: 25 mmol/L (ref 22–32)
CREATININE: 1.17 mg/dL (ref 0.61–1.24)
Chloride: 111 mmol/L (ref 101–111)
Glucose, Bld: 85 mg/dL (ref 65–99)
Potassium: 3.4 mmol/L — ABNORMAL LOW (ref 3.5–5.1)
Sodium: 141 mmol/L (ref 135–145)
Total Protein: 6.3 g/dL — ABNORMAL LOW (ref 6.5–8.1)

## 2017-03-24 LAB — LIPASE, BLOOD: LIPASE: 21 U/L (ref 11–51)

## 2017-03-24 LAB — URINALYSIS, ROUTINE W REFLEX MICROSCOPIC
Bilirubin Urine: NEGATIVE
GLUCOSE, UA: NEGATIVE mg/dL
HGB URINE DIPSTICK: NEGATIVE
KETONES UR: NEGATIVE mg/dL
Leukocytes, UA: NEGATIVE
NITRITE: NEGATIVE
PROTEIN: NEGATIVE mg/dL
pH: 5.5 (ref 5.0–8.0)

## 2017-03-24 LAB — CBC
HCT: 40.2 % (ref 39.0–52.0)
Hemoglobin: 13 g/dL (ref 13.0–17.0)
MCH: 26.4 pg (ref 26.0–34.0)
MCHC: 32.3 g/dL (ref 30.0–36.0)
MCV: 81.7 fL (ref 78.0–100.0)
Platelets: 163 10*3/uL (ref 150–400)
RBC: 4.92 MIL/uL (ref 4.22–5.81)
RDW: 13.1 % (ref 11.5–15.5)
WBC: 5.1 10*3/uL (ref 4.0–10.5)

## 2017-03-24 MED ORDER — DICYCLOMINE HCL 20 MG PO TABS: 20.0000 mg | ORAL_TABLET | Freq: Two times a day (BID) | ORAL | 0 refills | Status: DC

## 2017-03-24 MED ORDER — DOCUSATE SODIUM 100 MG PO CAPS: 100.0000 mg | ORAL_CAPSULE | Freq: Two times a day (BID) | ORAL | 0 refills | Status: DC

## 2017-03-24 MED ORDER — POLYETHYLENE GLYCOL 3350 17 G PO PACK: 17.0000 g | PACK | Freq: Every day | ORAL | 0 refills | Status: DC | PRN

## 2017-03-24 NOTE — ED Triage Notes (Signed)
Patient complains of months of intermittent right sided abdominal pain. Thinks initially was related to hernia repair but request further evaluation. Had 1 episode of emesis on Friday. Appears comfortable on assessment

## 2017-03-24 NOTE — ED Notes (Signed)
Patient transported to X-ray 

## 2017-03-24 NOTE — ED Notes (Signed)
ED Provider at bedside. 
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
## 2017-05-07 ENCOUNTER — Encounter: Payer: Self-pay | Admitting: Family Medicine

## 2017-05-07 ENCOUNTER — Ambulatory Visit (INDEPENDENT_AMBULATORY_CARE_PROVIDER_SITE_OTHER): Payer: Medicaid Other | Admitting: Family Medicine

## 2017-05-07 VITALS — BP 128/68 | HR 72 | Temp 98.5°F | Resp 16 | Ht 74.0 in | Wt 283.0 lb

## 2017-05-07 DIAGNOSIS — R0981 Nasal congestion: Secondary | ICD-10-CM

## 2017-05-07 DIAGNOSIS — F17200 Nicotine dependence, unspecified, uncomplicated: Secondary | ICD-10-CM

## 2017-05-07 DIAGNOSIS — Z9109 Other allergy status, other than to drugs and biological substances: Secondary | ICD-10-CM

## 2017-05-07 DIAGNOSIS — R438 Other disturbances of smell and taste: Secondary | ICD-10-CM

## 2017-05-07 DIAGNOSIS — R635 Abnormal weight gain: Secondary | ICD-10-CM

## 2017-05-07 DIAGNOSIS — G44329 Chronic post-traumatic headache, not intractable: Secondary | ICD-10-CM | POA: Diagnosis not present

## 2017-05-07 DIAGNOSIS — R0982 Postnasal drip: Secondary | ICD-10-CM

## 2017-05-07 DIAGNOSIS — R296 Repeated falls: Secondary | ICD-10-CM

## 2017-05-07 DIAGNOSIS — R42 Dizziness and giddiness: Secondary | ICD-10-CM

## 2017-05-07 DIAGNOSIS — Z114 Encounter for screening for human immunodeficiency virus [HIV]: Secondary | ICD-10-CM

## 2017-05-07 DIAGNOSIS — R7303 Prediabetes: Secondary | ICD-10-CM

## 2017-05-07 DIAGNOSIS — Z1159 Encounter for screening for other viral diseases: Secondary | ICD-10-CM

## 2017-05-07 LAB — CBC WITH DIFFERENTIAL/PLATELET
BASOS PCT: 0 %
Basophils Absolute: 0 cells/uL (ref 0–200)
EOS ABS: 196 {cells}/uL (ref 15–500)
Eosinophils Relative: 4 %
HEMATOCRIT: 43.4 % (ref 38.5–50.0)
HEMOGLOBIN: 14 g/dL (ref 13.2–17.1)
LYMPHS ABS: 1617 {cells}/uL (ref 850–3900)
LYMPHS PCT: 33 %
MCH: 26.2 pg — ABNORMAL LOW (ref 27.0–33.0)
MCHC: 32.3 g/dL (ref 32.0–36.0)
MCV: 81.1 fL (ref 80.0–100.0)
MPV: 9.8 fL (ref 7.5–12.5)
Monocytes Absolute: 343 cells/uL (ref 200–950)
Monocytes Relative: 7 %
NEUTROS ABS: 2744 {cells}/uL (ref 1500–7800)
Neutrophils Relative %: 56 %
Platelets: 166 10*3/uL (ref 140–400)
RBC: 5.35 MIL/uL (ref 4.20–5.80)
RDW: 14 % (ref 11.0–15.0)
WBC: 4.9 10*3/uL (ref 3.8–10.8)

## 2017-05-07 LAB — POCT URINALYSIS DIP (DEVICE)
Bilirubin Urine: NEGATIVE
GLUCOSE, UA: NEGATIVE mg/dL
Hgb urine dipstick: NEGATIVE
KETONES UR: NEGATIVE mg/dL
Leukocytes, UA: NEGATIVE
Nitrite: NEGATIVE
PH: 5.5 (ref 5.0–8.0)
PROTEIN: NEGATIVE mg/dL
Specific Gravity, Urine: >=1.03 (ref 1.005–1.030)
UROBILINOGEN UA: 0.2 mg/dL (ref 0.0–1.0)
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
## 2017-05-08 ENCOUNTER — Other Ambulatory Visit: Payer: Self-pay | Admitting: Family Medicine

## 2017-05-08 ENCOUNTER — Other Ambulatory Visit: Payer: Self-pay

## 2017-05-08 DIAGNOSIS — R438 Other disturbances of smell and taste: Secondary | ICD-10-CM

## 2017-05-08 LAB — COMPLETE METABOLIC PANEL WITH GFR
ALT: 19 U/L (ref 9–46)
AST: 21 U/L (ref 10–35)
Albumin: 4.2 g/dL (ref 3.6–5.1)
Alkaline Phosphatase: 91 U/L (ref 40–115)
BUN: 14 mg/dL (ref 7–25)
CHLORIDE: 107 mmol/L (ref 98–110)
CO2: 23 mmol/L (ref 20–31)
Calcium: 9 mg/dL (ref 8.6–10.3)
Creat: 1.21 mg/dL (ref 0.70–1.33)
GFR, EST AFRICAN AMERICAN: 76 mL/min (ref >=60)
GFR, EST NON AFRICAN AMERICAN: 66 mL/min (ref >=60)
GLUCOSE: 84 mg/dL (ref 65–99)
Potassium: 3.8 mmol/L (ref 3.5–5.3)
SODIUM: 142 mmol/L (ref 135–146)
Total Bilirubin: 0.2 mg/dL (ref 0.2–1.2)
Total Protein: 6.8 g/dL (ref 6.1–8.1)

## 2017-05-08 LAB — HEPATITIS C ANTIBODY: HCV Ab: NEGATIVE

## 2017-05-08 LAB — TSH: TSH: 0.93 mIU/L (ref 0.40–4.50)

## 2017-05-08 LAB — HIV ANTIBODY (ROUTINE TESTING W REFLEX): HIV: NONREACTIVE

## 2017-05-08 MED ORDER — CHLORHEXIDINE GLUCONATE 0.12% ORAL RINSE (MEDLINE KIT): 15.0000 mL | Freq: Two times a day (BID) | OROMUCOSAL | 0 refills | Status: AC

## 2017-05-08 NOTE — Telephone Encounter (Signed)
-----   Message from Dorena Dew, [place] sent at 05/08/2017  8:26 AM EDT ----- Regarding: lab results Please inform Mr. Langland that all laboratory values are within a normal range. He will not require any additional interventions at this time. For metallic taste, oral rinse twice daily. Will send in chlorhexidine gluconate mouth rinse swish 15 mls twice daily for 2 weeks. Please schedule follow up if problem persists.  Thanks ----- Message ----- From: Interface, [facility] Sent: 05/08/2017   3:14 AM To: Dorena Dew, FNP

## 2017-05-08 NOTE — Telephone Encounter (Signed)
Called and advised patient of lab wnl and to use mouth rinse as directed twice daily. Advised patient that if the condition continued to call and schedule another appointment. Patient verbalized understanding.   [facility] also [facility] [address] Thanks!

## 2017-05-10 DIAGNOSIS — R438 Other disturbances of smell and taste: Secondary | ICD-10-CM | POA: Insufficient documentation

## 2017-05-10 NOTE — Patient Instructions (Signed)
Rinse mouth twice daily with chlorohexadine.  Practice good oral hygiene.  Schedule follow up with neurology Will follow up by phone with any abnormal laboratory results.   No new medications on today

## 2017-05-30 ENCOUNTER — Ambulatory Visit: Payer: Medicaid Other | Admitting: Family Medicine

## 2017-11-06 ENCOUNTER — Ambulatory Visit: Payer: Medicaid Other | Admitting: Family Medicine

## 2018-01-02 ENCOUNTER — Ambulatory Visit: Payer: Medicaid Other | Admitting: Family Medicine

## 2018-01-02 ENCOUNTER — Encounter: Payer: Self-pay | Admitting: Family Medicine

## 2018-01-02 VITALS — BP 128/72 | HR 91 | Temp 98.2°F | Resp 16 | Ht 74.0 in | Wt 305.0 lb

## 2018-01-02 DIAGNOSIS — G8929 Other chronic pain: Secondary | ICD-10-CM

## 2018-01-02 DIAGNOSIS — E785 Hyperlipidemia, unspecified: Secondary | ICD-10-CM

## 2018-01-02 DIAGNOSIS — E669 Obesity, unspecified: Secondary | ICD-10-CM

## 2018-01-02 DIAGNOSIS — R030 Elevated blood-pressure reading, without diagnosis of hypertension: Secondary | ICD-10-CM

## 2018-01-02 DIAGNOSIS — M25551 Pain in right hip: Secondary | ICD-10-CM | POA: Diagnosis not present

## 2018-01-02 DIAGNOSIS — M79641 Pain in right hand: Secondary | ICD-10-CM | POA: Diagnosis not present

## 2018-01-02 DIAGNOSIS — R7303 Prediabetes: Secondary | ICD-10-CM

## 2018-01-02 DIAGNOSIS — M25569 Pain in unspecified knee: Secondary | ICD-10-CM | POA: Diagnosis not present

## 2018-01-02 DIAGNOSIS — F17200 Nicotine dependence, unspecified, uncomplicated: Secondary | ICD-10-CM

## 2018-01-02 LAB — POCT URINALYSIS DIP (DEVICE)
Glucose, UA: NEGATIVE mg/dL
HGB URINE DIPSTICK: NEGATIVE
Leukocytes, UA: NEGATIVE
Nitrite: NEGATIVE
PH: 5.5 (ref 5.0–8.0)
PROTEIN: NEGATIVE mg/dL
Urobilinogen, UA: 0.2 mg/dL (ref 0.0–1.0)

## 2018-01-02 LAB — POCT GLYCOSYLATED HEMOGLOBIN (HGB A1C): Hemoglobin A1C: 6

## 2018-01-02 MED ORDER — KETOROLAC TROMETHAMINE 60 MG/2ML IM SOLN
60.0000 mg | Freq: Once | INTRAMUSCULAR | Status: AC
  Administered 2018-01-02: 60 mg via INTRAMUSCULAR

## 2018-01-02 NOTE — Patient Instructions (Addendum)
You have gained 22 pounds since your most recent visit. Recommend a lowfat, low carbohydrate diet divided over 5-6 small meals, increase water intake to 6-8 glasses, and 150 minutes per week of cardiovascular exercise.   Exercising to Lose Weight Exercising can help you to lose weight. In order to lose weight through exercise, you need to do vigorous-intensity exercise. You can tell that you are exercising with vigorous intensity if you are breathing very hard and fast and cannot hold a conversation while exercising. Moderate-intensity exercise helps to maintain your current weight. You can tell that you are exercising at a moderate level if you have a higher heart rate and faster breathing, but you are still able to hold a conversation. How often should I exercise? Choose an activity that you enjoy and set realistic goals. Your health care provider can help you to make an activity plan that works for you. Exercise regularly as directed by your health care provider. This may include:  Doing resistance training twice each week, such as: ? Push-ups. ? Sit-ups. ? Lifting weights. ? Using resistance bands.  Doing a given intensity of exercise for a given amount of time. Choose from these options: ? 150 minutes of moderate-intensity exercise every week. ? 75 minutes of vigorous-intensity exercise every week. ? A mix of moderate-intensity and vigorous-intensity exercise every week.  Children, pregnant women, people who are out of shape, people who are overweight, and older adults may need to consult a health care provider for individual recommendations. If you have any sort of medical condition, be sure to consult your health care provider before starting a new exercise program. What are some activities that can help me to lose weight?  Walking at a rate of at least 4.5 miles an hour.  Jogging or running at a rate of 5 miles per hour.  Biking at a rate of at least 10 miles per hour.  Lap  swimming.  Roller-skating or in-line skating.  Cross-country skiing.  Vigorous competitive sports, such as football, basketball, and soccer.  Jumping rope.  Aerobic dancing. How can I be more active in my day-to-day activities?  Use the stairs instead of the elevator.  Take a walk during your lunch break.  If you drive, park your car farther away from work or school.  If you take public transportation, get off one stop early and walk the rest of the way.  Make all of your phone calls while standing up and walking around.  Get up, stretch, and walk around every 30 minutes throughout the day. What guidelines should I follow while exercising?  Do not exercise so much that you hurt yourself, feel dizzy, or get very short of breath.  Consult your health care provider prior to starting a new exercise program.  Wear comfortable clothes and shoes with good support.  Drink plenty of water while you exercise to prevent dehydration or heat stroke. Body water is lost during exercise and must be replaced.  Work out until you breathe faster and your heart beats faster. This information is not intended to replace advice given to you by your health care provider. Make sure you discuss any questions you have with your health care provider. Document Released: 01/05/2011 Document Revised: 05/10/2016 Document Reviewed: 05/06/2014 Elsevier Interactive Patient Education  2018 [employer] Calories are units of energy. Your body needs a certain amount of calories from food to keep you going throughout the day. When you eat more calories  than your body needs, your body stores the extra calories as fat. When you eat fewer calories than your body needs, your body burns fat to get the energy it needs. Calorie counting means keeping track of how many calories you eat and drink each day. Calorie counting can be helpful if you need to lose weight. If you make sure to  eat fewer calories than your body needs, you should lose weight. Ask your health care provider what a healthy weight is for you. For calorie counting to work, you will need to eat the right number of calories in a day in order to lose a healthy amount of weight per week. A dietitian can help you determine how many calories you need in a day and will give you suggestions on how to reach your calorie goal.  A healthy amount of weight to lose per week is usually 1-2 lb (0.5-0.9 kg). This usually means that your daily calorie intake should be reduced by 500-750 calories.  Eating 1,200 - 1,500 calories per day can help most women lose weight.  Eating 1,500 - 1,800 calories per day can help most men lose weight.  What is my plan? My goal is to have __________ calories per day. If I have this many calories per day, I should lose around __________ pounds per week. What do I need to know about calorie counting? In order to meet your daily calorie goal, you will need to:  Find out how many calories are in each food you would like to eat. Try to do this before you eat.  Decide how much of the food you plan to eat.  Write down what you ate and how many calories it had. Doing this is called keeping a food log.  To successfully lose weight, it is important to balance calorie counting with a healthy lifestyle that includes regular activity. Aim for 150 minutes of moderate exercise (such as walking) or 75 minutes of vigorous exercise (such as running) each week. Where do I find calorie information?  The number of calories in a food can be found on a Nutrition Facts label. If a food does not have a Nutrition Facts label, try to look up the calories online or ask your dietitian for help. Remember that calories are listed per serving. If you choose to have more than one serving of a food, you will have to multiply the calories per serving by the amount of servings you plan to eat. For example, the label on a  package of bread might say that a serving size is 1 slice and that there are 90 calories in a serving. If you eat 1 slice, you will have eaten 90 calories. If you eat 2 slices, you will have eaten 180 calories. How do I keep a food log? Immediately after each meal, record the following information in your food log:  What you ate. Don't forget to include toppings, sauces, and other extras on the food.  How much you ate. This can be measured in cups, ounces, or number of items.  How many calories each food and drink had.  The total number of calories in the meal.  Keep your food log near you, such as in a small notebook in your pocket, or use a mobile app or website. Some programs will calculate calories for you and show you how many calories you have left for the day to meet your goal. What are some calorie counting tips?  Use your  calories on foods and drinks that will fill you up and not leave you hungry: ? Some examples of foods that fill you up are nuts and nut butters, vegetables, lean proteins, and high-fiber foods like whole grains. High-fiber foods are foods with more than 5 g fiber per serving. ? Drinks such as sodas, specialty coffee drinks, alcohol, and juices have a lot of calories, yet do not fill you up.  Eat nutritious foods and avoid empty calories. Empty calories are calories you get from foods or beverages that do not have many vitamins or protein, such as candy, sweets, and soda. It is better to have a nutritious high-calorie food (such as an avocado) than a food with few nutrients (such as a bag of chips).  Know how many calories are in the foods you eat most often. This will help you calculate calorie counts faster.  Pay attention to calories in drinks. Low-calorie drinks include water and unsweetened drinks.  Pay attention to nutrition labels for "low fat" or "fat free" foods. These foods sometimes have the same amount of calories or more calories than the full fat  versions. They also often have added sugar, starch, or salt, to make up for flavor that was removed with the fat.  Find a way of tracking calories that works for you. Get creative. Try different apps or programs if writing down calories does not work for you. What are some portion control tips?  Know how many calories are in a serving. This will help you know how many servings of a certain food you can have.  Use a measuring cup to measure serving sizes. You could also try weighing out portions on a kitchen scale. With time, you will be able to estimate serving sizes for some foods.  Take some time to put servings of different foods on your favorite plates, bowls, and cups so you know what a serving looks like.  Try not to eat straight from a bag or box. Doing this can lead to overeating. Put the amount you would like to eat in a cup or on a plate to make sure you are eating the right portion.  Use smaller plates, glasses, and bowls to prevent overeating.  Try not to multitask (for example, watch TV or use your computer) while eating. If it is time to eat, sit down at a table and enjoy your food. This will help you to know when you are full. It will also help you to be aware of what you are eating and how much you are eating. What are tips for following this plan? Reading food labels  Check the calorie count compared to the serving size. The serving size may be smaller than what you are used to eating.  Check the source of the calories. Make sure the food you are eating is high in vitamins and protein and low in saturated and trans fats. Shopping  Read nutrition labels while you shop. This will help you make healthy decisions before you decide to purchase your food.  Make a grocery list and stick to it. Cooking  Try to cook your favorite foods in a healthier way. For example, try baking instead of frying.  Use low-fat dairy products. Meal planning  Use more fruits and vegetables.  Half of your plate should be fruits and vegetables.  Include lean proteins like poultry and fish. How do I count calories when eating out?  Ask for smaller portion sizes.  Consider sharing an entree  and sides instead of getting your own entree.  If you get your own entree, eat only half. Ask for a box at the beginning of your meal and put the rest of your entree in it so you are not tempted to eat it.  If calories are listed on the menu, choose the lower calorie options.  Choose dishes that include vegetables, fruits, whole grains, low-fat dairy products, and lean protein.  Choose items that are boiled, broiled, grilled, or steamed. Stay away from items that are buttered, battered, fried, or served with cream sauce. Items labeled "crispy" are usually fried, unless stated otherwise.  Choose water, low-fat milk, unsweetened iced tea, or other drinks without added sugar. If you want an alcoholic beverage, choose a lower calorie option such as a glass of wine or light beer.  Ask for dressings, sauces, and syrups on the side. These are usually high in calories, so you should limit the amount you eat.  If you want a salad, choose a garden salad and ask for grilled meats. Avoid extra toppings like bacon, cheese, or fried items. Ask for the dressing on the side, or ask for olive oil and vinegar or lemon to use as dressing.  Estimate how many servings of a food you are given. For example, a serving of cooked rice is  cup or about the size of half a baseball. Knowing serving sizes will help you be aware of how much food you are eating at restaurants. The list below tells you how big or small some common portion sizes are based on everyday objects: ? 1 oz-4 stacked dice. ? 3 oz-1 deck of cards. ? 1 tsp-1 die. ? 1 Tbsp- a ping-pong ball. ? 2 Tbsp-1 ping-pong ball. ?  cup- baseball. ? 1 cup-1 baseball. Summary  Calorie counting means keeping track of how many calories you eat and drink each  day. If you eat fewer calories than your body needs, you should lose weight.  A healthy amount of weight to lose per week is usually 1-2 lb (0.5-0.9 kg). This usually means reducing your daily calorie intake by 500-750 calories.  The number of calories in a food can be found on a Nutrition Facts label. If a food does not have a Nutrition Facts label, try to look up the calories online or ask your dietitian for help.  Use your calories on foods and drinks that will fill you up, and not on foods and drinks that will leave you hungry.  Use smaller plates, glasses, and bowls to prevent overeating. This information is not intended to replace advice given to you by your health care provider. Make sure you discuss any questions you have with your health care provider. Document Released: 12/03/2005 Document Revised: 11/02/2016 Document Reviewed: 11/02/2016 Elsevier Interactive Patient Education  [employer].
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
## 2018-01-03 ENCOUNTER — Telehealth: Payer: Self-pay

## 2018-01-03 LAB — CBC WITH DIFFERENTIAL/PLATELET
BASOS: 0 %
Basophils Absolute: 0 10*3/uL (ref 0.0–0.2)
EOS (ABSOLUTE): 0.2 10*3/uL (ref 0.0–0.4)
EOS: 3 %
HEMATOCRIT: 44 % (ref 37.5–51.0)
Hemoglobin: 14.3 g/dL (ref 13.0–17.7)
Immature Grans (Abs): 0 10*3/uL (ref 0.0–0.1)
Immature Granulocytes: 0 %
LYMPHS ABS: 1.8 10*3/uL (ref 0.7–3.1)
Lymphs: 32 %
MCH: 26.5 pg — AB (ref 26.6–33.0)
MCHC: 32.5 g/dL (ref 31.5–35.7)
MCV: 82 fL (ref 79–97)
MONOS ABS: 0.6 10*3/uL (ref 0.1–0.9)
Monocytes: 10 %
NEUTROS PCT: 55 %
Neutrophils Absolute: 3 10*3/uL (ref 1.4–7.0)
PLATELETS: 185 10*3/uL (ref 150–379)
RBC: 5.39 x10E6/uL (ref 4.14–5.80)
RDW: 14 % (ref 12.3–15.4)
WBC: 5.7 10*3/uL (ref 3.4–10.8)

## 2018-01-03 LAB — LIPID PANEL
CHOL/HDL RATIO: 3.9 ratio (ref 0.0–5.0)
Cholesterol, Total: 190 mg/dL (ref 100–199)
HDL: 49 mg/dL (ref >39)
LDL Calculated: 119 mg/dL — ABNORMAL HIGH (ref 0–99)
Triglycerides: 112 mg/dL (ref 0–149)
VLDL Cholesterol Cal: 22 mg/dL (ref 5–40)

## 2018-01-03 LAB — BASIC METABOLIC PANEL
BUN / CREAT RATIO: 12 (ref 9–20)
BUN: 14 mg/dL (ref 6–24)
CO2: 22 mmol/L (ref 20–29)
CREATININE: 1.2 mg/dL (ref 0.76–1.27)
Calcium: 9.5 mg/dL (ref 8.7–10.2)
Chloride: 102 mmol/L (ref 96–106)
GFR calc Af Amer: 76 mL/min/{1.73_m2} (ref >59)
GFR, EST NON AFRICAN AMERICAN: 66 mL/min/{1.73_m2} (ref >59)
GLUCOSE: 98 mg/dL (ref 65–99)
POTASSIUM: 4.5 mmol/L (ref 3.5–5.2)
SODIUM: 141 mmol/L (ref 134–144)

## 2018-01-03 LAB — TSH: TSH: 1.71 u[IU]/mL (ref 0.450–4.500)

## 2018-01-03 NOTE — Telephone Encounter (Signed)
Called and spoke with patient, advised that cholesterol was elevated at 115 and he should eat a lowfat/ low carb diet over 5 to 6 small meals daily. Drink 6 to 8 glasses of water daily and exercise 150 minutes weekly. Thanks!

## 2018-01-03 NOTE — Telephone Encounter (Signed)
-----   Message from Dorena Dew, [place] sent at 01/03/2018 12:39 PM EST ----- Regarding: lab results Please inform patient that LDL cholesterol is elevated at 115, goal is < 100. Recommend a lowfat, low carbohydrate diet divided over 5-6 small meals, increase water intake to 6-8 glasses, and 150 minutes per week of cardiovascular exercise.   Will repeat lipid panel at follow up visit.   Thanks

## 2018-01-04 MED ORDER — NAPROXEN SODIUM 220 MG PO TABS: 220.0000 mg | ORAL_TABLET | Freq: Every day | ORAL | 0 refills | Status: DC | PRN

## 2018-02-15 ENCOUNTER — Emergency Department (HOSPITAL_COMMUNITY)
Admission: EM | Admit: 2018-02-15 | Discharge: 2018-02-15 | Disposition: A | Discharge status: Home / Self Care | Payer: Medicaid Other | Attending: Emergency Medicine | Admitting: Emergency Medicine

## 2018-02-15 ENCOUNTER — Encounter (HOSPITAL_COMMUNITY): Payer: Self-pay | Admitting: *Deleted

## 2018-02-15 ENCOUNTER — Emergency Department (HOSPITAL_COMMUNITY): Payer: Medicaid Other

## 2018-02-15 ENCOUNTER — Other Ambulatory Visit: Payer: Self-pay

## 2018-02-15 DIAGNOSIS — I10 Essential (primary) hypertension: Secondary | ICD-10-CM | POA: Insufficient documentation

## 2018-02-15 DIAGNOSIS — E119 Type 2 diabetes mellitus without complications: Secondary | ICD-10-CM | POA: Diagnosis not present

## 2018-02-15 DIAGNOSIS — J45909 Unspecified asthma, uncomplicated: Secondary | ICD-10-CM | POA: Diagnosis not present

## 2018-02-15 DIAGNOSIS — R109 Unspecified abdominal pain: Secondary | ICD-10-CM | POA: Diagnosis not present

## 2018-02-15 DIAGNOSIS — F17210 Nicotine dependence, cigarettes, uncomplicated: Secondary | ICD-10-CM | POA: Diagnosis not present

## 2018-02-15 DIAGNOSIS — K5900 Constipation, unspecified: Secondary | ICD-10-CM | POA: Diagnosis not present

## 2018-02-15 LAB — COMPREHENSIVE METABOLIC PANEL
ALK PHOS: 82 U/L (ref 38–126)
ALT: 24 U/L (ref 17–63)
ANION GAP: 10 (ref 5–15)
AST: 25 U/L (ref 15–41)
Albumin: 3.7 g/dL (ref 3.5–5.0)
BUN: 10 mg/dL (ref 6–20)
CHLORIDE: 102 mmol/L (ref 101–111)
CO2: 27 mmol/L (ref 22–32)
Calcium: 9.3 mg/dL (ref 8.9–10.3)
Creatinine, Ser: 1.21 mg/dL (ref 0.61–1.24)
GFR calc non Af Amer: >60 mL/min (ref >60)
Glucose, Bld: 107 mg/dL — ABNORMAL HIGH (ref 65–99)
Potassium: 3.7 mmol/L (ref 3.5–5.1)
SODIUM: 139 mmol/L (ref 135–145)
Total Bilirubin: 0.6 mg/dL (ref 0.3–1.2)
Total Protein: 6.9 g/dL (ref 6.5–8.1)

## 2018-02-15 LAB — URINALYSIS, ROUTINE W REFLEX MICROSCOPIC
Bilirubin Urine: NEGATIVE
Glucose, UA: NEGATIVE mg/dL
Hgb urine dipstick: NEGATIVE
KETONES UR: NEGATIVE mg/dL
Nitrite: NEGATIVE
PROTEIN: NEGATIVE mg/dL
Specific Gravity, Urine: 1.014 (ref 1.005–1.030)
pH: 6 (ref 5.0–8.0)

## 2018-02-15 LAB — CBC
HCT: 45.3 % (ref 39.0–52.0)
HEMOGLOBIN: 14.5 g/dL (ref 13.0–17.0)
MCH: 27.2 pg (ref 26.0–34.0)
MCHC: 32 g/dL (ref 30.0–36.0)
MCV: 85 fL (ref 78.0–100.0)
Platelets: 173 10*3/uL (ref 150–400)
RBC: 5.33 MIL/uL (ref 4.22–5.81)
RDW: 13.7 % (ref 11.5–15.5)
WBC: 5 10*3/uL (ref 4.0–10.5)

## 2018-02-15 LAB — LIPASE, BLOOD: LIPASE: 23 U/L (ref 11–51)

## 2018-02-15 MED ORDER — POLYETHYLENE GLYCOL 3350 17 G PO PACK: 17.0000 g | PACK | Freq: Every day | ORAL | 0 refills | Status: DC
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
## 2018-02-15 NOTE — ED Triage Notes (Signed)
Pt in c/o RUQ pain onset around hernia repair site that was completed in 2017, pain onset x 2 days, pt denies n/v/d, pt denies n/v/d, A&O x4

## 2018-05-20 ENCOUNTER — Ambulatory Visit: Payer: Self-pay | Admitting: Podiatry

## 2018-07-03 ENCOUNTER — Ambulatory Visit (INDEPENDENT_AMBULATORY_CARE_PROVIDER_SITE_OTHER): Payer: Medicaid Other | Admitting: Family Medicine

## 2018-07-03 VITALS — BP 138/68 | HR 72 | Temp 98.7°F | Resp 16 | Ht 74.0 in | Wt 302.0 lb

## 2018-07-03 DIAGNOSIS — R7303 Prediabetes: Secondary | ICD-10-CM | POA: Diagnosis not present

## 2018-07-03 DIAGNOSIS — Z8719 Personal history of other diseases of the digestive system: Secondary | ICD-10-CM

## 2018-07-03 DIAGNOSIS — Z9889 Other specified postprocedural states: Secondary | ICD-10-CM | POA: Diagnosis not present

## 2018-07-03 DIAGNOSIS — M25561 Pain in right knee: Secondary | ICD-10-CM | POA: Diagnosis not present

## 2018-07-03 DIAGNOSIS — E669 Obesity, unspecified: Secondary | ICD-10-CM | POA: Diagnosis not present

## 2018-07-03 DIAGNOSIS — R34 Anuria and oliguria: Secondary | ICD-10-CM | POA: Diagnosis not present

## 2018-07-03 DIAGNOSIS — R1030 Lower abdominal pain, unspecified: Secondary | ICD-10-CM

## 2018-07-03 DIAGNOSIS — G894 Chronic pain syndrome: Secondary | ICD-10-CM | POA: Diagnosis not present

## 2018-07-03 DIAGNOSIS — K219 Gastro-esophageal reflux disease without esophagitis: Secondary | ICD-10-CM | POA: Diagnosis not present

## 2018-07-03 DIAGNOSIS — G8929 Other chronic pain: Secondary | ICD-10-CM | POA: Diagnosis not present

## 2018-07-03 LAB — POCT URINALYSIS DIPSTICK
Bilirubin, UA: NEGATIVE
Blood, UA: NEGATIVE
Glucose, UA: NEGATIVE
Ketones, UA: NEGATIVE
Leukocytes, UA: NEGATIVE
Nitrite, UA: NEGATIVE
Protein, UA: POSITIVE — AB
Spec Grav, UA: >=1.03 — AB (ref 1.010–1.025)
Urobilinogen, UA: 4 E.U./dL — AB
pH, UA: 5.5 (ref 5.0–8.0)

## 2018-07-03 LAB — POCT GLYCOSYLATED HEMOGLOBIN (HGB A1C): Hemoglobin A1C: 5.9 % — AB (ref 4.0–5.6)

## 2018-07-03 MED ORDER — DICLOFENAC SODIUM 75 MG PO TBEC: 75.0000 mg | DELAYED_RELEASE_TABLET | Freq: Two times a day (BID) | ORAL | 0 refills | Status: DC

## 2018-07-03 MED ORDER — OMEPRAZOLE 40 MG PO CPDR: 40.0000 mg | DELAYED_RELEASE_CAPSULE | Freq: Every day | ORAL | 3 refills | Status: DC

## 2018-07-03 NOTE — Progress Notes (Signed)
uri
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
## 2018-07-03 NOTE — Patient Instructions (Signed)

## 2018-07-04 LAB — CBC WITH DIFFERENTIAL/PLATELET
Basophils Absolute: 0 10*3/uL (ref 0.0–0.2)
Basos: 1 %
EOS (ABSOLUTE): 0.2 10*3/uL (ref 0.0–0.4)
Eos: 4 %
Hematocrit: 43.5 % (ref 37.5–51.0)
Hemoglobin: 13.9 g/dL (ref 13.0–17.7)
Immature Grans (Abs): 0 10*3/uL (ref 0.0–0.1)
Immature Granulocytes: 0 %
Lymphocytes Absolute: 1.6 10*3/uL (ref 0.7–3.1)
Lymphs: 31 %
MCH: 26.3 pg — ABNORMAL LOW (ref 26.6–33.0)
MCHC: 32 g/dL (ref 31.5–35.7)
MCV: 82 fL (ref 79–97)
Monocytes Absolute: 0.5 10*3/uL (ref 0.1–0.9)
Monocytes: 9 %
Neutrophils Absolute: 2.9 10*3/uL (ref 1.4–7.0)
Neutrophils: 55 %
Platelets: 187 10*3/uL (ref 150–450)
RBC: 5.29 x10E6/uL (ref 4.14–5.80)
RDW: 13 % (ref 12.3–15.4)
WBC: 5.2 10*3/uL (ref 3.4–10.8)

## 2018-07-04 LAB — COMPREHENSIVE METABOLIC PANEL
ALT: 25 IU/L (ref 0–44)
AST: 25 IU/L (ref 0–40)
Albumin/Globulin Ratio: 1.8 (ref 1.2–2.2)
Albumin: 4.3 g/dL (ref 3.5–5.5)
Alkaline Phosphatase: 94 IU/L (ref 39–117)
BUN/Creatinine Ratio: 12 (ref 9–20)
BUN: 13 mg/dL (ref 6–24)
Bilirubin Total: <0.2 mg/dL (ref 0.0–1.2)
CO2: 24 mmol/L (ref 20–29)
Calcium: 9.2 mg/dL (ref 8.7–10.2)
Chloride: 105 mmol/L (ref 96–106)
Creatinine, Ser: 1.11 mg/dL (ref 0.76–1.27)
GFR calc Af Amer: 84 mL/min/{1.73_m2} (ref >59)
GFR calc non Af Amer: 72 mL/min/{1.73_m2} (ref >59)
Globulin, Total: 2.4 g/dL (ref 1.5–4.5)
Glucose: 103 mg/dL — ABNORMAL HIGH (ref 65–99)
Potassium: 3.8 mmol/L (ref 3.5–5.2)
Sodium: 141 mmol/L (ref 134–144)
Total Protein: 6.7 g/dL (ref 6.0–8.5)

## 2018-07-04 LAB — LIPID PANEL
Chol/HDL Ratio: 4.6 ratio (ref 0.0–5.0)
Cholesterol, Total: 175 mg/dL (ref 100–199)
HDL: 38 mg/dL — ABNORMAL LOW (ref >39)
LDL Calculated: 110 mg/dL — ABNORMAL HIGH (ref 0–99)
Triglycerides: 136 mg/dL (ref 0–149)
VLDL Cholesterol Cal: 27 mg/dL (ref 5–40)

## 2018-07-04 LAB — TSH: TSH: 0.906 u[IU]/mL (ref 0.450–4.500)

## 2018-07-11 ENCOUNTER — Ambulatory Visit (HOSPITAL_COMMUNITY)
Admission: RE | Admit: 2018-07-11 | Discharge: 2018-07-11 | Disposition: A | Discharge status: Home / Self Care | Payer: Medicaid Other | Source: Ambulatory Visit | Attending: Family Medicine | Admitting: Family Medicine

## 2018-07-11 DIAGNOSIS — R1030 Lower abdominal pain, unspecified: Secondary | ICD-10-CM | POA: Diagnosis present

## 2018-07-11 DIAGNOSIS — K760 Fatty (change of) liver, not elsewhere classified: Secondary | ICD-10-CM | POA: Diagnosis not present

## 2018-07-14 ENCOUNTER — Telehealth: Payer: Self-pay

## 2018-07-14 NOTE — Telephone Encounter (Signed)
-----   Message from Lanae Boast, [place] sent at 07/13/2018 11:26 AM EDT ----- Fatty liver noted. Recommend low carb diet and regular exercise.

## 2018-07-14 NOTE — Telephone Encounter (Signed)
Called and spoke with patient, advised that ultrasound shows fatty liver. Recommended that he eat a low carb/cholesterol diet and exercise. Thanks!

## 2018-08-04 ENCOUNTER — Ambulatory Visit: Payer: Medicaid Other | Admitting: Family Medicine

## 2018-08-13 ENCOUNTER — Encounter: Payer: Self-pay | Admitting: Family Medicine

## 2018-08-13 ENCOUNTER — Ambulatory Visit (INDEPENDENT_AMBULATORY_CARE_PROVIDER_SITE_OTHER): Payer: Medicaid Other | Admitting: Family Medicine

## 2018-08-13 ENCOUNTER — Ambulatory Visit (HOSPITAL_COMMUNITY)
Admission: RE | Admit: 2018-08-13 | Discharge: 2018-08-13 | Disposition: A | Discharge status: Home / Self Care | Payer: Medicaid Other | Source: Ambulatory Visit | Attending: Family Medicine | Admitting: Family Medicine

## 2018-08-13 VITALS — BP 130/63 | HR 78 | Temp 99.1°F | Resp 16 | Ht 74.0 in | Wt 299.0 lb

## 2018-08-13 DIAGNOSIS — N5082 Scrotal pain: Secondary | ICD-10-CM

## 2018-08-13 DIAGNOSIS — R0781 Pleurodynia: Secondary | ICD-10-CM

## 2018-08-13 MED ORDER — TRAMADOL HCL 50 MG PO TABS: 50.0000 mg | ORAL_TABLET | Freq: Two times a day (BID) | ORAL | 0 refills | Status: DC | PRN

## 2018-08-13 NOTE — Patient Instructions (Signed)
Inguinal Hernia, Adult An inguinal hernia is when fat or the intestines push through the area where the leg meets the lower belly (groin) and make a rounded lump (bulge). This condition happens over time. There are three types of inguinal hernias. These types include:  Hernias that can be pushed back into the belly (are reducible).  Hernias that cannot be pushed back into the belly (are incarcerated).  Hernias that cannot be pushed back into the belly and lose their blood supply (get strangulated). This type needs emergency surgery.  Follow these instructions at home: Lifestyle  Drink enough fluid to keep your urine (pee) clear or pale yellow.  Eat plenty of fruits, vegetables, and whole grains. These have a lot of fiber. Talk with your doctor if you have questions.  Avoid lifting heavy objects.  Avoid standing for long periods of time.  Do not use tobacco products. These include cigarettes, chewing tobacco, or e-cigarettes. If you need help quitting, ask your doctor.  Try to stay at a healthy weight. General instructions  Do not try to force the hernia back in.  Watch your hernia for any changes in color or size. Let your doctor know if there are any changes.  Take over-the-counter and prescription medicines only as told by your doctor.  Keep all follow-up visits as told by your doctor. This is important. Contact a doctor if:  You have a fever.  You have new symptoms.  Your symptoms get worse. Get help right away if:  The area where the legs meets the lower belly has: ? Pain that gets worse suddenly. ? A bulge that gets bigger suddenly and does not go down. ? A bulge that turns red or purple. ? A bulge that is painful to the touch.  You are a man and your scrotum: ? Suddenly feels painful. ? Suddenly changes in size.  You feel sick to your stomach (nauseous) and this feeling does not go away.  You throw up (vomit) and this keeps happening.  You feel your heart  beating a lot more quickly than normal.  You cannot poop (have a bowel movement) or pass gas. This information is not intended to replace advice given to you by your health care provider. Make sure you discuss any questions you have with your health care provider. Document Released: 01/03/2007 Document Revised: 05/10/2016 Document Reviewed: 10/13/2014 Elsevier Interactive Patient Education  2018 Elsevier Inc.  
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
## 2018-10-13 ENCOUNTER — Ambulatory Visit: Payer: Medicaid Other | Admitting: Family Medicine

## 2018-10-13 ENCOUNTER — Encounter: Payer: Self-pay | Admitting: Family Medicine

## 2018-10-13 VITALS — BP 135/63 | HR 76 | Temp 98.5°F | Resp 16 | Ht 74.0 in | Wt 307.0 lb

## 2018-10-13 DIAGNOSIS — M25551 Pain in right hip: Secondary | ICD-10-CM

## 2018-10-13 DIAGNOSIS — E119 Type 2 diabetes mellitus without complications: Secondary | ICD-10-CM | POA: Diagnosis not present

## 2018-10-13 DIAGNOSIS — M25561 Pain in right knee: Secondary | ICD-10-CM | POA: Diagnosis not present

## 2018-10-13 DIAGNOSIS — G8929 Other chronic pain: Secondary | ICD-10-CM

## 2018-10-13 DIAGNOSIS — M2550 Pain in unspecified joint: Secondary | ICD-10-CM | POA: Diagnosis not present

## 2018-10-13 LAB — POCT GLYCOSYLATED HEMOGLOBIN (HGB A1C): Hemoglobin A1C: 5.8 % — AB (ref 4.0–5.6)

## 2018-10-13 MED ORDER — TRAMADOL HCL 50 MG PO TABS: 50.0000 mg | ORAL_TABLET | Freq: Two times a day (BID) | ORAL | 0 refills | Status: DC | PRN

## 2018-10-13 MED ORDER — MELOXICAM 15 MG PO TABS: 15.0000 mg | ORAL_TABLET | Freq: Every day | ORAL | 1 refills | Status: DC

## 2018-10-13 MED ORDER — GABAPENTIN 300 MG PO CAPS: 300.0000 mg | ORAL_CAPSULE | Freq: Three times a day (TID) | ORAL | 3 refills | Status: DC
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
## 2018-10-13 NOTE — Patient Instructions (Signed)
Chronic Pain, Adult Chronic pain is a type of pain that lasts or keeps coming back (recurs) for at least six months. You may have chronic headaches, abdominal pain, or body pain. Chronic pain may be related to an illness, such as fibromyalgia or complex regional pain syndrome. Sometimes the cause of chronic pain is not known. Chronic pain can make it hard for you to do daily activities. If not treated, chronic pain can lead to other health problems, including anxiety and depression. Treatment depends on the cause and severity of your pain. You may need to work with a pain specialist to come up with a treatment plan. The plan may include medicine, counseling, and physical therapy. Many people benefit from a combination of two or more types of treatment to control their pain. Follow these instructions at home: Lifestyle  Consider keeping a pain diary to share with your health care providers.  Consider talking with a mental health care provider (psychologist) about how to cope with chronic pain.  Consider joining a chronic pain support group.  Try to control or lower your stress levels. Talk to your health care provider about strategies to do this. General instructions   Take over-the-counter and prescription medicines only as told by your health care provider.  Follow your treatment plan as told by your health care provider. This may include: ? Gentle, regular exercise. ? Eating a healthy diet that includes foods such as vegetables, fruits, fish, and lean meats. ? Cognitive or behavioral therapy. ? Working with a Community education officer. ? Meditation or yoga. ? Acupuncture or massage therapy. ? Aroma, color, light, or sound therapy. ? Local electrical stimulation. ? Shots (injections) of numbing or pain-relieving medicines into the spine or the area of pain.  Check your pain level as told by your health care provider. Ask your health care provider if you should use a pain scale.  Learn as  much as you can about how to manage your chronic pain. Ask your health care provider if an intensive pain rehabilitation program or a chronic pain specialist would be helpful.  Keep all follow-up visits as told by your health care provider. This is important. Contact a health care provider if:  Your pain gets worse.  You have new pain.  You have trouble sleeping.  You have trouble doing your normal activities.  Your pain is not controlled with treatment.  Your have side effects from pain medicine.  You feel weak. Get help right away if:  You lose feeling or have numbness in your body.  You lose control of bowel or bladder function.  Your pain suddenly gets much worse.  You develop shaking or chills.  You develop confusion.  You develop chest pain.  You have trouble breathing or shortness of breath.  You pass out.  You have thoughts about hurting yourself or others. This information is not intended to replace advice given to you by your health care provider. Make sure you discuss any questions you have with your health care provider. Document Released: 08/25/2002 Document Revised: 08/02/2016 Document Reviewed: 05/22/2016 Elsevier Interactive Patient Education  2018 [employer]. Gabapentin capsules or tablets What is this medicine? GABAPENTIN (GA ba pen tin) is used to control partial seizures in adults with epilepsy. It is also used to treat certain types of nerve pain. This medicine may be used for other purposes; ask your health care provider or pharmacist if you have questions. COMMON BRAND NAME(S): Active-PAC with Gabapentin, Gabarone, Neurontin What should I tell  my health care provider before I take this medicine? They need to know if you have any of these conditions: -kidney disease -suicidal thoughts, plans, or attempt; a previous suicide attempt by you or a family member -an unusual or allergic reaction to gabapentin, other medicines, foods, dyes, or  preservatives -pregnant or trying to get pregnant -breast-feeding How should I use this medicine? Take this medicine by mouth with a glass of water. Follow the directions on the prescription label. You can take it with or without food. If it upsets your stomach, take it with food.Take your medicine at regular intervals. Do not take it more often than directed. Do not stop taking except on your doctor's advice. If you are directed to break the 600 or 800 mg tablets in half as part of your dose, the extra half tablet should be used for the next dose. If you have not used the extra half tablet within 28 days, it should be thrown away. A special MedGuide will be given to you by the pharmacist with each prescription and refill. Be sure to read this information carefully each time. Talk to your pediatrician regarding the use of this medicine in children. Special care may be needed. Overdosage: If you think you have taken too much of this medicine contact a poison control center or emergency room at once. NOTE: This medicine is only for you. Do not share this medicine with others. What if I miss a dose? If you miss a dose, take it as soon as you can. If it is almost time for your next dose, take only that dose. Do not take double or extra doses. What may interact with this medicine? Do not take this medicine with any of the following medications: -other gabapentin products This medicine may also interact with the following medications: -alcohol -antacids -antihistamines for allergy, cough and cold -certain medicines for anxiety or sleep -certain medicines for depression or psychotic disturbances -homatropine; hydrocodone -naproxen -narcotic medicines (opiates) for pain -phenothiazines like chlorpromazine, mesoridazine, prochlorperazine, thioridazine This list may not describe all possible interactions. Give your health care provider a list of all the medicines, herbs, non-prescription drugs, or  dietary supplements you use. Also tell them if you smoke, drink alcohol, or use illegal drugs. Some items may interact with your medicine. What should I watch for while using this medicine? Visit your doctor or health care professional for regular checks on your progress. You may want to keep a record at home of how you feel your condition is responding to treatment. You may want to share this information with your doctor or health care professional at each visit. You should contact your doctor or health care professional if your seizures get worse or if you have any new types of seizures. Do not stop taking this medicine or any of your seizure medicines unless instructed by your doctor or health care professional. Stopping your medicine suddenly can increase your seizures or their severity. Wear a medical identification bracelet or chain if you are taking this medicine for seizures, and carry a card that lists all your medications. You may get drowsy, dizzy, or have blurred vision. Do not drive, use machinery, or do anything that needs mental alertness until you know how this medicine affects you. To reduce dizzy or fainting spells, do not sit or stand up quickly, especially if you are an older patient. Alcohol can increase drowsiness and dizziness. Avoid alcoholic drinks. Your mouth may get dry. Chewing sugarless gum or sucking hard  candy, and drinking plenty of water will help. The use of this medicine may increase the chance of suicidal thoughts or actions. Pay special attention to how you are responding while on this medicine. Any worsening of mood, or thoughts of suicide or dying should be reported to your health care professional right away. Women who become pregnant while using this medicine may enroll in the [place] Pregnancy Registry by calling [phone]. This registry collects information about the safety of antiepileptic drug use during pregnancy. What side effects  may I notice from receiving this medicine? Side effects that you should report to your doctor or health care professional as soon as possible: -allergic reactions like skin rash, itching or hives, swelling of the face, lips, or tongue -worsening of mood, thoughts or actions of suicide or dying Side effects that usually do not require medical attention (report to your doctor or health care professional if they continue or are bothersome): -constipation -difficulty walking or controlling muscle movements -dizziness -nausea -slurred speech -tiredness -tremors -weight gain This list may not describe all possible side effects. Call your doctor for medical advice about side effects. You may report side effects to FDA at 1-800-FDA-1088. Where should I keep my medicine? Keep out of reach of children. This medicine may cause accidental overdose and death if it taken by other adults, children, or pets. Mix any unused medicine with a substance like cat litter or coffee grounds. Then throw the medicine away in a sealed container like a sealed bag or a coffee can with a lid. Do not use the medicine after the expiration date. Store at room temperature between 15 and 30 degrees C (59 and 86 degrees F). NOTE: This sheet is a summary. It may not cover all possible information. If you have questions about this medicine, talk to your doctor, pharmacist, or health care provider.  2018 Elsevier/Gold Standard (2014-01-29 15:26:50)

## 2018-10-15 ENCOUNTER — Ambulatory Visit (HOSPITAL_COMMUNITY)
Admission: RE | Admit: 2018-10-15 | Discharge: 2018-10-15 | Disposition: A | Discharge status: Home / Self Care | Payer: Medicaid Other | Source: Ambulatory Visit | Attending: Family Medicine | Admitting: Family Medicine

## 2018-10-15 DIAGNOSIS — M2550 Pain in unspecified joint: Secondary | ICD-10-CM

## 2018-10-15 DIAGNOSIS — M47816 Spondylosis without myelopathy or radiculopathy, lumbar region: Secondary | ICD-10-CM | POA: Diagnosis not present

## 2018-10-15 DIAGNOSIS — M25551 Pain in right hip: Secondary | ICD-10-CM | POA: Insufficient documentation

## 2018-10-15 DIAGNOSIS — R937 Abnormal findings on diagnostic imaging of other parts of musculoskeletal system: Secondary | ICD-10-CM | POA: Diagnosis not present

## 2018-10-15 DIAGNOSIS — G8929 Other chronic pain: Secondary | ICD-10-CM | POA: Diagnosis not present

## 2018-10-15 LAB — ANA,IFA RA DIAG PNL W/RFLX TIT/PATN
ANA Titer 1: NEGATIVE
Cyclic Citrullin Peptide Ab: 9 units (ref 0–19)

## 2018-10-15 LAB — ARTHRITIS PANEL
Anti Nuclear Antibody(ANA): NEGATIVE
Rhuematoid fact SerPl-aCnc: <10 IU/mL (ref 0.0–13.9)
Sed Rate: 12 mm/hr (ref 0–30)
Uric Acid: 5.1 mg/dL (ref 3.7–8.6)

## 2018-10-20 ENCOUNTER — Telehealth: Payer: Self-pay

## 2018-10-20 NOTE — Telephone Encounter (Signed)
Patient called to ask of xray results. He states his pain is getting worse and it is harder to walk. I advised him to go to urgent care or er. Patient verbalized understanding. Thanks!

## 2018-11-04 ENCOUNTER — Telehealth: Payer: Self-pay

## 2018-11-04 DIAGNOSIS — M461 Sacroiliitis, not elsewhere classified: Secondary | ICD-10-CM

## 2018-11-04 NOTE — Telephone Encounter (Signed)
Patient called today. He is still having a lot of pain and has not been notified of his xray results. Can you please notify of xray results and advised what he can do? Thanks !

## 2018-11-05 MED ORDER — METHYLPREDNISOLONE 4 MG PO TBPK: ORAL_TABLET | ORAL | 0 refills | Status: DC

## 2018-11-05 NOTE — Telephone Encounter (Signed)
I am going to send him to physical medicine for the pain. I will send in a steroid pack. There is possible inflammation of the sacroiliac joint. The steroids should help with inflammation and pain.

## 2018-11-05 NOTE — Telephone Encounter (Signed)
Called and spoke with patient. Advised of possible inflammation of the sacroiliac joint and that she is prescribing a steroid pack to help with inflammation and pain and sending a referral to physical medicine. Patient verbalized understanding. Thanks !

## 2018-11-10 ENCOUNTER — Encounter: Payer: Self-pay | Admitting: Physical Medicine & Rehabilitation

## 2018-11-16 ENCOUNTER — Other Ambulatory Visit: Payer: Self-pay | Admitting: Family Medicine

## 2018-11-16 DIAGNOSIS — M25561 Pain in right knee: Secondary | ICD-10-CM

## 2018-11-16 DIAGNOSIS — G8929 Other chronic pain: Secondary | ICD-10-CM

## 2018-11-16 DIAGNOSIS — M461 Sacroiliitis, not elsewhere classified: Secondary | ICD-10-CM

## 2018-11-16 DIAGNOSIS — M2550 Pain in unspecified joint: Secondary | ICD-10-CM

## 2018-11-16 DIAGNOSIS — M25551 Pain in right hip: Secondary | ICD-10-CM

## 2018-11-27 ENCOUNTER — Encounter: Payer: Medicaid Other | Admitting: Physical Medicine & Rehabilitation

## 2018-12-11 ENCOUNTER — Encounter: Payer: Medicaid Other | Attending: Physical Medicine & Rehabilitation | Admitting: Physical Medicine & Rehabilitation

## 2018-12-11 ENCOUNTER — Encounter: Payer: Self-pay | Admitting: Physical Medicine & Rehabilitation

## 2018-12-11 VITALS — BP 138/88 | HR 87 | Resp 14 | Ht 74.0 in | Wt 326.0 lb

## 2018-12-11 DIAGNOSIS — Z6841 Body Mass Index (BMI) 40.0 and over, adult: Secondary | ICD-10-CM | POA: Insufficient documentation

## 2018-12-11 DIAGNOSIS — M791 Myalgia, unspecified site: Secondary | ICD-10-CM | POA: Insufficient documentation

## 2018-12-11 DIAGNOSIS — M4802 Spinal stenosis, cervical region: Secondary | ICD-10-CM | POA: Diagnosis not present

## 2018-12-11 DIAGNOSIS — Z833 Family history of diabetes mellitus: Secondary | ICD-10-CM | POA: Insufficient documentation

## 2018-12-11 DIAGNOSIS — G479 Sleep disorder, unspecified: Secondary | ICD-10-CM | POA: Insufficient documentation

## 2018-12-11 DIAGNOSIS — M5481 Occipital neuralgia: Secondary | ICD-10-CM | POA: Insufficient documentation

## 2018-12-11 DIAGNOSIS — I10 Essential (primary) hypertension: Secondary | ICD-10-CM | POA: Diagnosis not present

## 2018-12-11 DIAGNOSIS — Z9089 Acquired absence of other organs: Secondary | ICD-10-CM | POA: Diagnosis not present

## 2018-12-11 DIAGNOSIS — M542 Cervicalgia: Secondary | ICD-10-CM | POA: Diagnosis not present

## 2018-12-11 DIAGNOSIS — G44321 Chronic post-traumatic headache, intractable: Secondary | ICD-10-CM

## 2018-12-11 DIAGNOSIS — Z8659 Personal history of other mental and behavioral disorders: Secondary | ICD-10-CM

## 2018-12-11 DIAGNOSIS — F329 Major depressive disorder, single episode, unspecified: Secondary | ICD-10-CM | POA: Diagnosis not present

## 2018-12-11 DIAGNOSIS — G894 Chronic pain syndrome: Secondary | ICD-10-CM

## 2018-12-11 DIAGNOSIS — E119 Type 2 diabetes mellitus without complications: Secondary | ICD-10-CM | POA: Insufficient documentation

## 2018-12-11 DIAGNOSIS — M25519 Pain in unspecified shoulder: Secondary | ICD-10-CM | POA: Insufficient documentation

## 2018-12-11 DIAGNOSIS — F1910 Other psychoactive substance abuse, uncomplicated: Secondary | ICD-10-CM | POA: Insufficient documentation

## 2018-12-11 DIAGNOSIS — G8929 Other chronic pain: Secondary | ICD-10-CM

## 2018-12-11 DIAGNOSIS — R51 Headache: Secondary | ICD-10-CM | POA: Diagnosis present

## 2018-12-11 DIAGNOSIS — R269 Unspecified abnormalities of gait and mobility: Secondary | ICD-10-CM | POA: Diagnosis not present

## 2018-12-11 DIAGNOSIS — F419 Anxiety disorder, unspecified: Secondary | ICD-10-CM | POA: Insufficient documentation

## 2018-12-11 DIAGNOSIS — Z87891 Personal history of nicotine dependence: Secondary | ICD-10-CM | POA: Insufficient documentation

## 2018-12-11 MED ORDER — METHOCARBAMOL 500 MG PO TABS: 500.0000 mg | ORAL_TABLET | Freq: Two times a day (BID) | ORAL | 1 refills | Status: DC | PRN

## 2018-12-11 MED ORDER — AMITRIPTYLINE HCL 10 MG PO TABS: 10.0000 mg | ORAL_TABLET | Freq: Every day | ORAL | 1 refills | Status: DC

## 2018-12-11 MED ORDER — DICLOFENAC SODIUM 1 % TD GEL: 2.0000 g | Freq: Four times a day (QID) | TRANSDERMAL | 1 refills | Status: DC

## 2018-12-11 MED ORDER — DULOXETINE HCL 30 MG PO CPEP: 30.0000 mg | ORAL_CAPSULE | Freq: Every day | ORAL | 1 refills | Status: DC
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
## 2019-01-08 ENCOUNTER — Encounter: Payer: Self-pay | Admitting: Physical Medicine & Rehabilitation

## 2019-01-08 ENCOUNTER — Encounter: Payer: Medicaid Other | Attending: Physical Medicine & Rehabilitation | Admitting: Physical Medicine & Rehabilitation

## 2019-01-08 VITALS — BP 126/81 | HR 83 | Resp 14 | Ht 74.0 in | Wt 326.0 lb

## 2019-01-08 DIAGNOSIS — Z9089 Acquired absence of other organs: Secondary | ICD-10-CM | POA: Insufficient documentation

## 2019-01-08 DIAGNOSIS — M5481 Occipital neuralgia: Secondary | ICD-10-CM | POA: Insufficient documentation

## 2019-01-08 DIAGNOSIS — F1910 Other psychoactive substance abuse, uncomplicated: Secondary | ICD-10-CM | POA: Diagnosis not present

## 2019-01-08 DIAGNOSIS — G894 Chronic pain syndrome: Secondary | ICD-10-CM | POA: Diagnosis not present

## 2019-01-08 DIAGNOSIS — F419 Anxiety disorder, unspecified: Secondary | ICD-10-CM | POA: Insufficient documentation

## 2019-01-08 DIAGNOSIS — M791 Myalgia, unspecified site: Secondary | ICD-10-CM | POA: Diagnosis not present

## 2019-01-08 DIAGNOSIS — R51 Headache: Secondary | ICD-10-CM | POA: Insufficient documentation

## 2019-01-08 DIAGNOSIS — R269 Unspecified abnormalities of gait and mobility: Secondary | ICD-10-CM | POA: Diagnosis not present

## 2019-01-08 DIAGNOSIS — M4802 Spinal stenosis, cervical region: Secondary | ICD-10-CM | POA: Diagnosis not present

## 2019-01-08 DIAGNOSIS — E119 Type 2 diabetes mellitus without complications: Secondary | ICD-10-CM | POA: Diagnosis not present

## 2019-01-08 DIAGNOSIS — G479 Sleep disorder, unspecified: Secondary | ICD-10-CM

## 2019-01-08 DIAGNOSIS — G44321 Chronic post-traumatic headache, intractable: Secondary | ICD-10-CM

## 2019-01-08 DIAGNOSIS — I10 Essential (primary) hypertension: Secondary | ICD-10-CM | POA: Insufficient documentation

## 2019-01-08 DIAGNOSIS — G8929 Other chronic pain: Secondary | ICD-10-CM

## 2019-01-08 DIAGNOSIS — M542 Cervicalgia: Secondary | ICD-10-CM | POA: Diagnosis not present

## 2019-01-08 DIAGNOSIS — Z8659 Personal history of other mental and behavioral disorders: Secondary | ICD-10-CM | POA: Diagnosis not present

## 2019-01-08 DIAGNOSIS — Z87891 Personal history of nicotine dependence: Secondary | ICD-10-CM | POA: Diagnosis not present

## 2019-01-08 DIAGNOSIS — F329 Major depressive disorder, single episode, unspecified: Secondary | ICD-10-CM | POA: Diagnosis not present

## 2019-01-08 DIAGNOSIS — M25519 Pain in unspecified shoulder: Secondary | ICD-10-CM | POA: Insufficient documentation

## 2019-01-08 DIAGNOSIS — Z6841 Body Mass Index (BMI) 40.0 and over, adult: Secondary | ICD-10-CM | POA: Insufficient documentation

## 2019-01-08 DIAGNOSIS — Z833 Family history of diabetes mellitus: Secondary | ICD-10-CM | POA: Insufficient documentation

## 2019-01-08 MED ORDER — DULOXETINE HCL 60 MG PO CPEP: 60.0000 mg | ORAL_CAPSULE | Freq: Every day | ORAL | 1 refills | Status: DC
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
## 2019-01-14 ENCOUNTER — Encounter: Payer: Self-pay | Admitting: Family Medicine

## 2019-01-14 ENCOUNTER — Ambulatory Visit (INDEPENDENT_AMBULATORY_CARE_PROVIDER_SITE_OTHER): Payer: Medicaid Other | Admitting: Family Medicine

## 2019-01-14 VITALS — BP 139/69 | HR 82 | Temp 98.5°F | Resp 16 | Ht 74.0 in | Wt 327.0 lb

## 2019-01-14 DIAGNOSIS — E119 Type 2 diabetes mellitus without complications: Secondary | ICD-10-CM

## 2019-01-14 DIAGNOSIS — L03112 Cellulitis of left axilla: Secondary | ICD-10-CM

## 2019-01-14 DIAGNOSIS — L918 Other hypertrophic disorders of the skin: Secondary | ICD-10-CM | POA: Diagnosis not present

## 2019-01-14 LAB — POCT URINALYSIS DIPSTICK
Bilirubin, UA: NEGATIVE
Blood, UA: NEGATIVE
Glucose, UA: NEGATIVE
Ketones, UA: NEGATIVE
Leukocytes, UA: NEGATIVE
Nitrite, UA: NEGATIVE
Protein, UA: POSITIVE — AB
Spec Grav, UA: >=1.03 — AB (ref 1.010–1.025)
Urobilinogen, UA: 0.2 E.U./dL
pH, UA: 5 (ref 5.0–8.0)

## 2019-01-14 MED ORDER — SULFAMETHOXAZOLE-TRIMETHOPRIM 800-160 MG PO TABS: 1.0000 | ORAL_TABLET | Freq: Two times a day (BID) | ORAL | 0 refills | Status: AC

## 2019-01-14 NOTE — Patient Instructions (Signed)
Skin Tag, Adult    A skin tag (acrochordon) is a soft, extra growth of skin. Most skin tags are flesh-colored and rarely bigger than a pencil eraser. They commonly form near areas where there are folds in the skin, such as the armpit or groin. Skin tags are not dangerous, and they do not spread from person to person (are not contagious).  You may have one skin tag or several. Skin tags do not require treatment. However, your health care provider may recommend removal of a skin tag if it:   Gets irritated from clothing.   Bleeds.   Is visible and unsightly.  Your health care provider can remove skin tags with a simple surgical procedure or a procedure that involves freezing the skin tag.  Follow these instructions at home:   Watch for any changes in your skin tag. A normal skin tag does not require any other special care at home.   Take over-the-counter and prescription medicines only as told by your health care provider.   Keep all follow-up visits as told by your health care provider. This is important.  Contact a health care provider if:   You have a skin tag that:  ? Becomes painful.  ? Changes color.  ? Bleeds.  ? Swells.   You develop more skin tags.  This information is not intended to replace advice given to you by your health care provider. Make sure you discuss any questions you have with your health care provider.  Document Released: 12/18/2015 Document Revised: 07/29/2016 Document Reviewed: 12/18/2015  Elsevier Interactive Patient Education  2019 Elsevier Inc[employer]
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
## 2019-01-15 LAB — MICROALBUMIN / CREATININE URINE RATIO
Creatinine, Urine: 208.5 mg/dL
Microalb/Creat Ratio: 6 mg/g creat (ref 0–29)
Microalbumin, Urine: 11.9 ug/mL

## 2019-01-22 ENCOUNTER — Ambulatory Visit: Payer: Medicaid Other | Admitting: Family Medicine

## 2019-02-03 ENCOUNTER — Encounter: Payer: Self-pay | Admitting: Internal Medicine

## 2019-02-05 ENCOUNTER — Encounter: Payer: Medicaid Other | Admitting: Physical Medicine & Rehabilitation

## 2019-05-21 ENCOUNTER — Ambulatory Visit: Payer: Medicaid Other | Admitting: Internal Medicine

## 2019-12-21 ENCOUNTER — Ambulatory Visit (INDEPENDENT_AMBULATORY_CARE_PROVIDER_SITE_OTHER)
Admission: EM | Admit: 2019-12-21 | Discharge: 2019-12-21 | Disposition: A | Discharge status: Home / Self Care | Payer: Medicaid Other | Source: Home / Self Care | Attending: Family Medicine | Admitting: Family Medicine

## 2019-12-21 ENCOUNTER — Encounter (HOSPITAL_COMMUNITY): Payer: Self-pay

## 2019-12-21 ENCOUNTER — Encounter (HOSPITAL_COMMUNITY): Payer: Self-pay | Admitting: *Deleted

## 2019-12-21 ENCOUNTER — Other Ambulatory Visit: Payer: Self-pay

## 2019-12-21 ENCOUNTER — Emergency Department (HOSPITAL_COMMUNITY): Payer: Medicaid Other

## 2019-12-21 ENCOUNTER — Inpatient Hospital Stay (HOSPITAL_COMMUNITY)
Admission: EM | Admit: 2019-12-21 | Discharge: 2019-12-28 | DRG: 177 | Disposition: A | Discharge status: Home / Self Care | Payer: Medicaid Other | Attending: Internal Medicine | Admitting: Internal Medicine

## 2019-12-21 DIAGNOSIS — U071 COVID-19: Principal | ICD-10-CM

## 2019-12-21 DIAGNOSIS — M1990 Unspecified osteoarthritis, unspecified site: Secondary | ICD-10-CM | POA: Diagnosis not present

## 2019-12-21 DIAGNOSIS — J9600 Acute respiratory failure, unspecified whether with hypoxia or hypercapnia: Secondary | ICD-10-CM

## 2019-12-21 DIAGNOSIS — F17200 Nicotine dependence, unspecified, uncomplicated: Secondary | ICD-10-CM | POA: Diagnosis present

## 2019-12-21 DIAGNOSIS — M545 Low back pain: Secondary | ICD-10-CM | POA: Diagnosis present

## 2019-12-21 DIAGNOSIS — M5020 Other cervical disc displacement, unspecified cervical region: Secondary | ICD-10-CM | POA: Diagnosis not present

## 2019-12-21 DIAGNOSIS — Z8042 Family history of malignant neoplasm of prostate: Secondary | ICD-10-CM

## 2019-12-21 DIAGNOSIS — J45909 Unspecified asthma, uncomplicated: Secondary | ICD-10-CM | POA: Diagnosis not present

## 2019-12-21 DIAGNOSIS — G8929 Other chronic pain: Secondary | ICD-10-CM | POA: Diagnosis not present

## 2019-12-21 DIAGNOSIS — K219 Gastro-esophageal reflux disease without esophagitis: Secondary | ICD-10-CM | POA: Diagnosis not present

## 2019-12-21 DIAGNOSIS — I10 Essential (primary) hypertension: Secondary | ICD-10-CM | POA: Diagnosis present

## 2019-12-21 DIAGNOSIS — Z800 Family history of malignant neoplasm of digestive organs: Secondary | ICD-10-CM

## 2019-12-21 DIAGNOSIS — Z841 Family history of disorders of kidney and ureter: Secondary | ICD-10-CM | POA: Diagnosis not present

## 2019-12-21 DIAGNOSIS — R0602 Shortness of breath: Secondary | ICD-10-CM | POA: Diagnosis present

## 2019-12-21 DIAGNOSIS — F329 Major depressive disorder, single episode, unspecified: Secondary | ICD-10-CM | POA: Diagnosis present

## 2019-12-21 DIAGNOSIS — Z6841 Body Mass Index (BMI) 40.0 and over, adult: Secondary | ICD-10-CM

## 2019-12-21 DIAGNOSIS — R7989 Other specified abnormal findings of blood chemistry: Secondary | ICD-10-CM | POA: Diagnosis not present

## 2019-12-21 DIAGNOSIS — I82451 Acute embolism and thrombosis of right peroneal vein: Secondary | ICD-10-CM | POA: Diagnosis present

## 2019-12-21 DIAGNOSIS — I82409 Acute embolism and thrombosis of unspecified deep veins of unspecified lower extremity: Secondary | ICD-10-CM | POA: Diagnosis not present

## 2019-12-21 DIAGNOSIS — J9621 Acute and chronic respiratory failure with hypoxia: Secondary | ICD-10-CM | POA: Diagnosis not present

## 2019-12-21 DIAGNOSIS — Z23 Encounter for immunization: Secondary | ICD-10-CM | POA: Diagnosis not present

## 2019-12-21 DIAGNOSIS — Z833 Family history of diabetes mellitus: Secondary | ICD-10-CM | POA: Diagnosis not present

## 2019-12-21 DIAGNOSIS — Z8659 Personal history of other mental and behavioral disorders: Secondary | ICD-10-CM

## 2019-12-21 DIAGNOSIS — F17210 Nicotine dependence, cigarettes, uncomplicated: Secondary | ICD-10-CM | POA: Diagnosis not present

## 2019-12-21 DIAGNOSIS — E119 Type 2 diabetes mellitus without complications: Secondary | ICD-10-CM | POA: Diagnosis present

## 2019-12-21 DIAGNOSIS — R0902 Hypoxemia: Secondary | ICD-10-CM

## 2019-12-21 DIAGNOSIS — J189 Pneumonia, unspecified organism: Secondary | ICD-10-CM

## 2019-12-21 DIAGNOSIS — G894 Chronic pain syndrome: Secondary | ICD-10-CM | POA: Diagnosis present

## 2019-12-21 DIAGNOSIS — Z791 Long term (current) use of non-steroidal anti-inflammatories (NSAID): Secondary | ICD-10-CM

## 2019-12-21 DIAGNOSIS — R791 Abnormal coagulation profile: Secondary | ICD-10-CM | POA: Diagnosis not present

## 2019-12-21 LAB — CBC WITH DIFFERENTIAL/PLATELET
Abs Immature Granulocytes: 0.07 10*3/uL (ref 0.00–0.07)
Basophils Absolute: 0 10*3/uL (ref 0.0–0.1)
Basophils Relative: 0 %
Eosinophils Absolute: 0 10*3/uL (ref 0.0–0.5)
Eosinophils Relative: 0 %
HCT: 42.3 % (ref 39.0–52.0)
Hemoglobin: 13.8 g/dL (ref 13.0–17.0)
Immature Granulocytes: 1 %
Lymphocytes Relative: 10 %
Lymphs Abs: 0.7 10*3/uL (ref 0.7–4.0)
MCH: 26.7 pg (ref 26.0–34.0)
MCHC: 32.6 g/dL (ref 30.0–36.0)
MCV: 81.8 fL (ref 80.0–100.0)
Monocytes Absolute: 0.4 10*3/uL (ref 0.1–1.0)
Monocytes Relative: 6 %
Neutro Abs: 6 10*3/uL (ref 1.7–7.7)
Neutrophils Relative %: 83 %
Platelets: 184 10*3/uL (ref 150–400)
RBC: 5.17 MIL/uL (ref 4.22–5.81)
RDW: 12.5 % (ref 11.5–15.5)
WBC: 7.3 10*3/uL (ref 4.0–10.5)
nRBC: 0.7 % — ABNORMAL HIGH (ref 0.0–0.2)

## 2019-12-21 LAB — TROPONIN I (HIGH SENSITIVITY)
Troponin I (High Sensitivity): 62 ng/L — ABNORMAL HIGH (ref <18)
Troponin I (High Sensitivity): 58 ng/L — ABNORMAL HIGH (ref <18)

## 2019-12-21 LAB — TRIGLYCERIDES: Triglycerides: 111 mg/dL (ref <150)

## 2019-12-21 LAB — D-DIMER, QUANTITATIVE: D-Dimer, Quant: >20 ug/mL-FEU — ABNORMAL HIGH (ref 0.00–0.50)

## 2019-12-21 LAB — FIBRINOGEN: Fibrinogen: 708 mg/dL — ABNORMAL HIGH (ref 210–475)

## 2019-12-21 LAB — C-REACTIVE PROTEIN: CRP: 26.5 mg/dL — ABNORMAL HIGH (ref <1.0)

## 2019-12-21 LAB — COMPREHENSIVE METABOLIC PANEL
ALT: 34 U/L (ref 0–44)
AST: 49 U/L — ABNORMAL HIGH (ref 15–41)
Albumin: 3 g/dL — ABNORMAL LOW (ref 3.5–5.0)
Alkaline Phosphatase: 78 U/L (ref 38–126)
Anion gap: 15 (ref 5–15)
BUN: 11 mg/dL (ref 8–23)
CO2: 24 mmol/L (ref 22–32)
Calcium: 8.3 mg/dL — ABNORMAL LOW (ref 8.9–10.3)
Chloride: 93 mmol/L — ABNORMAL LOW (ref 98–111)
Creatinine, Ser: 1.17 mg/dL (ref 0.61–1.24)
GFR calc Af Amer: >60 mL/min (ref >60)
GFR calc non Af Amer: >60 mL/min (ref >60)
Glucose, Bld: 137 mg/dL — ABNORMAL HIGH (ref 70–99)
Potassium: 3.8 mmol/L (ref 3.5–5.1)
Sodium: 132 mmol/L — ABNORMAL LOW (ref 135–145)
Total Bilirubin: 1 mg/dL (ref 0.3–1.2)
Total Protein: 7 g/dL (ref 6.5–8.1)

## 2019-12-21 LAB — POC SARS CORONAVIRUS 2 AG: SARS Coronavirus 2 Ag: NEGATIVE

## 2019-12-21 LAB — LACTATE DEHYDROGENASE: LDH: 490 U/L — ABNORMAL HIGH (ref 98–192)

## 2019-12-21 LAB — FERRITIN: Ferritin: 166 ng/mL (ref 24–336)

## 2019-12-21 LAB — PROCALCITONIN: Procalcitonin: 0.21 ng/mL

## 2019-12-21 LAB — BRAIN NATRIURETIC PEPTIDE: B Natriuretic Peptide: 81.1 pg/mL (ref 0.0–100.0)

## 2019-12-21 LAB — RESPIRATORY PANEL BY RT PCR (FLU A&B, COVID)
Influenza A by PCR: NEGATIVE
Influenza B by PCR: NEGATIVE
SARS Coronavirus 2 by RT PCR: POSITIVE — AB

## 2019-12-21 LAB — POC SARS CORONAVIRUS 2 AG -  ED: SARS Coronavirus 2 Ag: NEGATIVE

## 2019-12-21 MED ORDER — SODIUM CHLORIDE 0.9 % IV SOLN
200.0000 mg | Freq: Once | INTRAVENOUS | Status: DC
  Filled 2019-12-21: qty 40

## 2019-12-21 MED ORDER — DEXAMETHASONE SODIUM PHOSPHATE 10 MG/ML IJ SOLN
8.0000 mg | Freq: Once | INTRAMUSCULAR | Status: AC
  Administered 2019-12-21: 8 mg via INTRAVENOUS
  Filled 2019-12-21: qty 1

## 2019-12-21 MED ORDER — INSULIN ASPART 100 UNIT/ML ~~LOC~~ SOLN
0.0000 [IU] | Freq: Three times a day (TID) | SUBCUTANEOUS | Status: DC
  Administered 2019-12-22: 4 [IU] via SUBCUTANEOUS
  Administered 2019-12-22: 7 [IU] via SUBCUTANEOUS
  Administered 2019-12-22 – 2019-12-24 (×5): 4 [IU] via SUBCUTANEOUS
  Administered 2019-12-24: 3 [IU] via SUBCUTANEOUS
  Administered 2019-12-24 – 2019-12-25 (×2): 4 [IU] via SUBCUTANEOUS
  Administered 2019-12-27 – 2019-12-28 (×3): 3 [IU] via SUBCUTANEOUS

## 2019-12-21 MED ORDER — INSULIN ASPART 100 UNIT/ML ~~LOC~~ SOLN: 0.0000 [IU] | Freq: Every day | SUBCUTANEOUS | Status: DC

## 2019-12-21 MED ORDER — TOCILIZUMAB 400 MG/20ML IV SOLN
800.0000 mg | Freq: Once | INTRAVENOUS | Status: AC
  Administered 2019-12-22: 800 mg via INTRAVENOUS
  Filled 2019-12-21: qty 40

## 2019-12-21 MED ORDER — FAMOTIDINE IN NACL 20-0.9 MG/50ML-% IV SOLN
20.0000 mg | Freq: Two times a day (BID) | INTRAVENOUS | Status: DC
  Filled 2019-12-21 (×2): qty 50

## 2019-12-21 MED ORDER — SODIUM CHLORIDE 0.9 % IV SOLN
100.0000 mg | Freq: Every day | INTRAVENOUS | Status: DC
  Filled 2019-12-21 (×2): qty 20

## 2019-12-21 NOTE — ED Notes (Signed)
Patient is being discharged from the Urgent [facility] and sent to the Emergency Department via EMS, per Dr. Mannie Stabile. patient is stable but in need of higher level of care due to hypoxemia/tachycardia. Patient is aware and verbalizes understanding of plan of care.  Vitals:   12/21/19 1252  BP: (!) 150/83  Pulse: (!) 111  Resp: (!) 28  Temp: 99.8 F (37.7 C)  SpO2: (!) 65%
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
## 2019-12-21 NOTE — ED Notes (Signed)
Just called pharm, checking on meds

## 2019-12-21 NOTE — ED Notes (Signed)
Attempted to call report to ED charge RN for pt transfer and no answer x2 attempts; voicemail unavailable. Attempted to call [facility], spoke with Gershon Cull PA

## 2019-12-21 NOTE — ED Notes (Signed)
Non emergent EMS arrive for transport to [facility] ED. Report given, pt remains on NRB with O2Sat 99%.
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
## 2019-12-21 NOTE — ED Triage Notes (Signed)
Patient presents to Urgent Care with complaints of SOB, pain in lower back. Has h/o of lower back pain and has run out of medicine since 12/17/19. Pt c/o n/v/d abd/flank pain bilat x2 days, vomiting resolved today but only able to tolerate sips fluid. Denies CP or dysuria s/s. States a feeling of difficulty breathing with mild exertion. Patient reports chronic HA; denies loss of taste/smell or sore throat/congestion.   Provider notified; O2 4L placed and SpO2 81%.

## 2019-12-21 NOTE — ED Notes (Signed)
Pt's dropped to low 80's.Repositioned  And bumped from 5 to 6L. and Only at 88%. Resp 26, Pt. Can carry on conversation but still occassionally pausing between words to speak

## 2019-12-21 NOTE — ED Notes (Signed)
O2 inc to 6L [place] and SPO2 inc to 84%. Rapid Covid test completed

## 2019-12-21 NOTE — ED Provider Notes (Signed)
Sign out note  64 year old male presents to ER with shortness of breath, hypoxia.  On arrival here, patient not in respiratory distress, speaking full sentences, requiring 6 L nasal cannula.  CXR with infiltrate versus edema.  Labs pending.  Covid PCR pending.  4:15 PM Received signout from Dr. Verner Chol, anticipate admission once labs, rapid Covid have returned, differential most likely heart failure versus Covid  7:10 PM Covid positive, BNP normal, suspect symptoms and hypoxia and CXR findings related to COVID-19.  Will start steroids and admit to hospitalist.  Patient currently 6 L nasal cannula, no distress     Lucrezia Starch, MD 12/21/19 1911

## 2019-12-21 NOTE — ED Notes (Signed)
Once place in recliner at 98% on 6L
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
## 2019-12-21 NOTE — ED Notes (Signed)
Placed on NRB at 15L and SPO2 inc 93% and resp effort improved.

## 2019-12-21 NOTE — ED Notes (Signed)
Called for EMS transport 2/2 hypoxemia need for NRB to maintain sat.

## 2019-12-22 ENCOUNTER — Inpatient Hospital Stay (HOSPITAL_COMMUNITY): Payer: Medicaid Other

## 2019-12-22 DIAGNOSIS — R7989 Other specified abnormal findings of blood chemistry: Secondary | ICD-10-CM

## 2019-12-22 DIAGNOSIS — G894 Chronic pain syndrome: Secondary | ICD-10-CM

## 2019-12-22 LAB — COMPREHENSIVE METABOLIC PANEL
ALT: 37 U/L (ref 0–44)
AST: 51 U/L — ABNORMAL HIGH (ref 15–41)
Albumin: 3.4 g/dL — ABNORMAL LOW (ref 3.5–5.0)
Alkaline Phosphatase: 75 U/L (ref 38–126)
Anion gap: 15 (ref 5–15)
BUN: 13 mg/dL (ref 8–23)
CO2: 29 mmol/L (ref 22–32)
Calcium: 8.7 mg/dL — ABNORMAL LOW (ref 8.9–10.3)
Chloride: 94 mmol/L — ABNORMAL LOW (ref 98–111)
Creatinine, Ser: 1.07 mg/dL (ref 0.61–1.24)
GFR calc Af Amer: >60 mL/min (ref >60)
GFR calc non Af Amer: >60 mL/min (ref >60)
Glucose, Bld: 160 mg/dL — ABNORMAL HIGH (ref 70–99)
Potassium: 3.8 mmol/L (ref 3.5–5.1)
Sodium: 138 mmol/L (ref 135–145)
Total Bilirubin: 0.9 mg/dL (ref 0.3–1.2)
Total Protein: 7.7 g/dL (ref 6.5–8.1)

## 2019-12-22 LAB — CBC WITH DIFFERENTIAL/PLATELET
Abs Immature Granulocytes: 0.07 10*3/uL (ref 0.00–0.07)
Basophils Absolute: 0 10*3/uL (ref 0.0–0.1)
Basophils Relative: 0 %
Eosinophils Absolute: 0 10*3/uL (ref 0.0–0.5)
Eosinophils Relative: 0 %
HCT: 44.6 % (ref 39.0–52.0)
Hemoglobin: 14.2 g/dL (ref 13.0–17.0)
Immature Granulocytes: 1 %
Lymphocytes Relative: 9 %
Lymphs Abs: 0.8 10*3/uL (ref 0.7–4.0)
MCH: 26.8 pg (ref 26.0–34.0)
MCHC: 31.8 g/dL (ref 30.0–36.0)
MCV: 84.3 fL (ref 80.0–100.0)
Monocytes Absolute: 0.4 10*3/uL (ref 0.1–1.0)
Monocytes Relative: 4 %
Neutro Abs: 7.6 10*3/uL (ref 1.7–7.7)
Neutrophils Relative %: 86 %
Platelets: 170 10*3/uL (ref 150–400)
RBC: 5.29 MIL/uL (ref 4.22–5.81)
RDW: 13.1 % (ref 11.5–15.5)
WBC: 8.9 10*3/uL (ref 4.0–10.5)
nRBC: 0 % (ref 0.0–0.2)

## 2019-12-22 LAB — GLUCOSE, CAPILLARY
Glucose-Capillary: 169 mg/dL — ABNORMAL HIGH (ref 70–99)
Glucose-Capillary: 165 mg/dL — ABNORMAL HIGH (ref 70–99)
Glucose-Capillary: 122 mg/dL — ABNORMAL HIGH (ref 70–99)

## 2019-12-22 LAB — C-REACTIVE PROTEIN: CRP: 33.3 mg/dL — ABNORMAL HIGH (ref <1.0)

## 2019-12-22 LAB — HEPARIN LEVEL (UNFRACTIONATED)
Heparin Unfractionated: 0.58 IU/mL (ref 0.30–0.70)
Heparin Unfractionated: 0.23 IU/mL — ABNORMAL LOW (ref 0.30–0.70)
Heparin Unfractionated: 0.35 IU/mL (ref 0.30–0.70)

## 2019-12-22 LAB — D-DIMER, QUANTITATIVE: D-Dimer, Quant: >20 ug/mL-FEU — ABNORMAL HIGH (ref 0.00–0.50)

## 2019-12-22 LAB — CBG MONITORING, ED: Glucose-Capillary: 135 mg/dL — ABNORMAL HIGH (ref 70–99)

## 2019-12-22 LAB — FERRITIN: Ferritin: 165 ng/mL (ref 24–336)

## 2019-12-22 LAB — FIBRINOGEN: Fibrinogen: 701 mg/dL — ABNORMAL HIGH (ref 210–475)

## 2019-12-22 LAB — HEMOGLOBIN A1C
Hgb A1c MFr Bld: 6.4 % — ABNORMAL HIGH (ref 4.8–5.6)
Mean Plasma Glucose: 136.98 mg/dL

## 2019-12-22 LAB — PROCALCITONIN: Procalcitonin: 0.38 ng/mL

## 2019-12-22 LAB — LACTATE DEHYDROGENASE: LDH: 555 U/L — ABNORMAL HIGH (ref 98–192)

## 2019-12-22 LAB — HIV ANTIBODY (ROUTINE TESTING W REFLEX): HIV Screen 4th Generation wRfx: NONREACTIVE

## 2019-12-22 LAB — ABO/RH: ABO/RH(D): O POS

## 2019-12-22 MED ORDER — SODIUM CHLORIDE 0.9 % IV SOLN: 100.0000 mg | Freq: Every day | INTRAVENOUS | Status: DC

## 2019-12-22 MED ORDER — GUAIFENESIN-DM 100-10 MG/5ML PO SYRP
10.0000 mL | ORAL_SOLUTION | ORAL | Status: DC | PRN
  Administered 2019-12-22: 10 mL via ORAL
  Filled 2019-12-22: qty 10

## 2019-12-22 MED ORDER — FAMOTIDINE 20 MG PO TABS
20.0000 mg | ORAL_TABLET | Freq: Every day | ORAL | Status: DC
  Administered 2019-12-22 – 2019-12-28 (×7): 20 mg via ORAL
  Filled 2019-12-22 (×7): qty 1

## 2019-12-22 MED ORDER — ENOXAPARIN SODIUM 40 MG/0.4ML ~~LOC~~ SOLN: 40.0000 mg | SUBCUTANEOUS | Status: DC

## 2019-12-22 MED ORDER — ZINC SULFATE 220 (50 ZN) MG PO CAPS
220.0000 mg | ORAL_CAPSULE | Freq: Every day | ORAL | Status: DC
  Administered 2019-12-22 – 2019-12-28 (×7): 220 mg via ORAL
  Filled 2019-12-22 (×7): qty 1

## 2019-12-22 MED ORDER — IPRATROPIUM-ALBUTEROL 20-100 MCG/ACT IN AERS
1.0000 | INHALATION_SPRAY | Freq: Four times a day (QID) | RESPIRATORY_TRACT | Status: DC
  Administered 2019-12-22 – 2019-12-28 (×23): 1 via RESPIRATORY_TRACT
  Filled 2019-12-22: qty 4

## 2019-12-22 MED ORDER — HYDROCOD POLST-CPM POLST ER 10-8 MG/5ML PO SUER
5.0000 mL | Freq: Two times a day (BID) | ORAL | Status: DC | PRN
  Administered 2019-12-27: 5 mL via ORAL
  Filled 2019-12-22: qty 5

## 2019-12-22 MED ORDER — SODIUM CHLORIDE 0.9 % IV SOLN: 500.0000 mg | INTRAVENOUS | Status: DC

## 2019-12-22 MED ORDER — ONDANSETRON HCL 4 MG PO TABS: 4.0000 mg | ORAL_TABLET | Freq: Four times a day (QID) | ORAL | Status: DC | PRN

## 2019-12-22 MED ORDER — SODIUM CHLORIDE 0.9 % IV SOLN: 200.0000 mg | Freq: Once | INTRAVENOUS | Status: DC

## 2019-12-22 MED ORDER — IOHEXOL 350 MG/ML SOLN
100.0000 mL | Freq: Once | INTRAVENOUS | Status: AC | PRN
  Administered 2019-12-22: 160 mL via INTRAVENOUS

## 2019-12-22 MED ORDER — SODIUM CHLORIDE 0.9 % IV SOLN
100.0000 mg | Freq: Every day | INTRAVENOUS | Status: AC
  Administered 2019-12-23 – 2019-12-26 (×4): 100 mg via INTRAVENOUS
  Filled 2019-12-22 (×4): qty 20

## 2019-12-22 MED ORDER — HEPARIN BOLUS VIA INFUSION
5800.0000 [IU] | Freq: Once | INTRAVENOUS | Status: AC
  Administered 2019-12-22: 5800 [IU] via INTRAVENOUS
  Filled 2019-12-22: qty 5800

## 2019-12-22 MED ORDER — DEXAMETHASONE SODIUM PHOSPHATE 10 MG/ML IJ SOLN
6.0000 mg | INTRAMUSCULAR | Status: DC
  Administered 2019-12-22 – 2019-12-26 (×5): 6 mg via INTRAVENOUS
  Filled 2019-12-22 (×5): qty 1

## 2019-12-22 MED ORDER — ASCORBIC ACID 500 MG PO TABS
500.0000 mg | ORAL_TABLET | Freq: Every day | ORAL | Status: DC
  Administered 2019-12-22 – 2019-12-28 (×7): 500 mg via ORAL
  Filled 2019-12-22 (×7): qty 1

## 2019-12-22 MED ORDER — HEPARIN (PORCINE) 25000 UT/250ML-% IV SOLN
2100.0000 [IU]/h | INTRAVENOUS | Status: DC
  Administered 2019-12-22: 2100 [IU]/h via INTRAVENOUS
  Administered 2019-12-22: 1850 [IU]/h via INTRAVENOUS
  Filled 2019-12-22 (×3): qty 250

## 2019-12-22 MED ORDER — HEPARIN BOLUS VIA INFUSION
2000.0000 [IU] | Freq: Once | INTRAVENOUS | Status: AC
  Administered 2019-12-22: 2000 [IU] via INTRAVENOUS
  Filled 2019-12-22: qty 2000

## 2019-12-22 MED ORDER — SODIUM CHLORIDE 0.9 % IV SOLN
200.0000 mg | Freq: Once | INTRAVENOUS | Status: AC
  Administered 2019-12-22: 200 mg via INTRAVENOUS
  Filled 2019-12-22: qty 40

## 2019-12-22 MED ORDER — ONDANSETRON HCL 4 MG/2ML IJ SOLN: 4.0000 mg | Freq: Four times a day (QID) | INTRAMUSCULAR | Status: DC | PRN

## 2019-12-22 MED ORDER — AZITHROMYCIN 500 MG PO TABS
500.0000 mg | ORAL_TABLET | Freq: Every day | ORAL | Status: DC
  Administered 2019-12-22 – 2019-12-27 (×6): 500 mg via ORAL
  Filled 2019-12-22 (×4): qty 2
  Filled 2019-12-22: qty 1
  Filled 2019-12-22: qty 2

## 2019-12-22 MED ORDER — ACETAMINOPHEN 325 MG PO TABS
650.0000 mg | ORAL_TABLET | Freq: Four times a day (QID) | ORAL | Status: DC | PRN
  Administered 2019-12-27: 650 mg via ORAL
  Filled 2019-12-22: qty 2

## 2019-12-22 NOTE — Progress Notes (Signed)
ANTICOAGULATION CONSULT NOTE - Initial Consult  Pharmacy Consult for heparin dosing Indication: r/o PE/DVT  Allergies  Allergen Reactions  . Amlodipine Nausea Only and Other (See Comments)    "Made my blood pressure go up"    Patient Measurements: Height: 6\' 2"  (188 cm) Weight: (!) 326 lb 15.1 oz (148.3 kg) IBW/kg (Calculated) : 82.2 Heparin Dosing Weight: HEPARIN DW (KG): 116.4  Vital Signs: Temp: 98.9 F (37.2 C) (01/05 2022) Temp Source: Oral (01/05 2022) BP: 113/93 (01/05 2022) Pulse Rate: 91 (01/05 2022)  Labs: Recent Labs    12/21/19 1542 12/21/19 1806 12/22/19 0705 12/22/19 1103 12/22/19 2030  HGB 13.8  --  14.2  --   --   HCT 42.3  --  44.6  --   --   PLT 184  --  170  --   --   HEPARINUNFRC  --   --  0.58 0.23* 0.35  CREATININE 1.17  --  1.07  --   --   TROPONINIHS 62* 58*  --   --   --     Estimated Creatinine Clearance: 111.4 mL/min (by C-G formula based on SCr of 1.07 mg/dL).    Assessment: Pharmacy consulted to dose heparin infusion for this 64 yo  CoVid + male with elevated D-Dimer >20 mcg/mL-FEU.  Patient wasn't taking any anti-coagulants prior to admission.  Baseline CBC is WNL.   12/22/19 2200 UPDATE Heparin level: 0.35 IU/mL, within therapeutic goal range RN reports no bleeding issues or infusion site complications   Goal of Therapy:  Heparin level 0.3-0.7 units/ml Monitor platelets by anticoagulation protocol: Yes   Plan:   Continue  heparin infusion rate at 2100 units/hr Confirmatory anti-Xa level in 6-8 hours and daily while on heparin Continue to monitor H&H and platelets  Despina Pole, Pharm. D. Clinical Pharmacist 12/22/2019 10:52 PM
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
## 2019-12-22 NOTE — Progress Notes (Addendum)
PROGRESS NOTE  Kenneth Davila [id number] DOB: 1956/10/18 DOA: 12/21/2019 PCP: Patient, No Pcp Per  HPI/Recap of past 96 hours: 64 year old male with past medical history of morbid obesity, reported diabetes and hypertension presented to urgent care on 1/4 with several days of progressively worsening shortness of breath and cough and found to be hypoxic requiring 6 L of oxygen with chest x-ray noting infiltrates.  Oxygen saturations then dropped further, requiring 15 L.  Patient admitted and transferred over to [facility].  Patient given Actemra, remdesivir and IV steroids.  Also noted to have elevated procalcitonin, so given IV antibiotics.  This morning, patient resting comfortably.  Lost IV access so we are attempting to replace.  Assessment/Plan: Principal Problem:   Acute respiratory failure due to COVID-19 [facility]): Continue IV remdesivir, steroids and supplemental oxygen.  Procalcitonin slightly elevated.  Continue IV antibiotics and if increased further tomorrow, will change to more broad-spectrum.  Given markedly elevated D-dimer, will also get a CT Angio of chest. Active Problems:   GERD (gastroesophageal reflux disease): Continue PPI.   Chronic pain syndrome: Continue pain medications.  Monitor for any kind of respiratory depression.    History of depression: Continue home medications.    Tobacco dependence: Nicotine patch.    Diabetes ([place]) mellitus, diet controlled: Not on any home diabetic medications.  A1c today at 6.4.  Will cover with sliding scale while on steroids.  Likely will not need medications upon discharge    Morbid obesity ([place]): Meets criteria BMI greater than 40.   Code Status: Full code  Family Communication: Updated son by phone.  Disposition Plan: Home once oxygen weaned off.   Consultants:  None  Procedures:  None  Antimicrobials:  IV Rocephin and Zithromax 1/4-present  IV Remdisivir 1/4-present  IV Actemra x1 dose 1/4  DVT  prophylaxis: Markedly elevated D-dimer, so on continuous heparin infusion.   Objective: Vitals:   12/22/19 0859 12/22/19 1147  BP:  (!) 143/77  Pulse: 95 (!) 106  Resp: 18 (!) 27  Temp:  99.2 F (37.3 C)  SpO2: 95% (!) 87%    Intake/Output Summary (Last 24 hours) at 12/22/2019 1335 Last data filed at 12/22/2019 0900 Gross per 24 hour  Intake --  Output 1600 ml  Net -1600 ml   Filed Weights   12/21/19 1411  Weight: (!) 148.3 kg   Body mass index is 41.98 kg/m.  Exam:   General: Currently resting comfortably, oriented x3, no acute distress  HEENT: Normocephalic and atraumatic, mucous members are moist  Neck: Thick, narrow airway  Cardiovascular: Regular rate and rhythm, S1-S2, borderline tachycardia  Respiratory: Few scattered rails, otherwise clear to auscultation bilaterally  Abdomen: Soft, nontender, nondistended, positive bowel sounds  Musculoskeletal: No clubbing or cyanosis, trace pitting edema  Skin: No skin breaks, tears or lesions  Neuro: No focal deficits  Psychiatry: Appropriate, no evidence of psychoses   Data Reviewed: CBC: Recent Labs  Lab 12/21/19 1542 12/22/19 0705  WBC 7.3 8.9  NEUTROABS 6.0 7.6  HGB 13.8 14.2  HCT 42.3 44.6  MCV 81.8 84.3  PLT 184 [id number]   Basic Metabolic Panel: Recent Labs  Lab 12/21/19 1542 12/22/19 0705  NA 132* 138  K 3.8 3.8  CL 93* 94*  CO2 24 29  GLUCOSE 137* 160*  BUN 11 13  CREATININE 1.17 1.07  CALCIUM 8.3* 8.7*   GFR: Estimated Creatinine Clearance: 111.4 mL/min (by C-G formula based on SCr of 1.07 mg/dL). Liver Function Tests: Recent Labs  Lab 12/21/19 1542 12/22/19 0705  AST 49* 51*  ALT 34 37  ALKPHOS 78 75  BILITOT 1.0 0.9  PROT 7.0 7.7  ALBUMIN 3.0* 3.4*   No results for input(s): LIPASE, AMYLASE in the last 168 hours. No results for input(s): AMMONIA in the last 168 hours. Coagulation Profile: No results for input(s): INR, PROTIME in the last 168 hours. Cardiac Enzymes: No  results for input(s): CKTOTAL, CKMB, CKMBINDEX, TROPONINI in the last 168 hours. BNP (last 3 results) No results for input(s): PROBNP in the last 8760 hours. HbA1C: No results for input(s): HGBA1C in the last 72 hours. CBG: Recent Labs  Lab 12/22/19 0206 12/22/19 0800 12/22/19 1124  GLUCAP 135* 169* 165*   Lipid Profile: Recent Labs    12/21/19 2014  TRIG 111   Thyroid Function Tests: No results for input(s): TSH, T4TOTAL, FREET4, T3FREE, THYROIDAB in the last 72 hours. Anemia Panel: Recent Labs    12/21/19 2014 12/22/19 0705  FERRITIN 166 165   Urine analysis:    Component Value Date/Time   COLORURINE YELLOW 02/15/2018 1435   APPEARANCEUR CLEAR 02/15/2018 1435   LABSPEC 1.014 02/15/2018 1435   PHURINE 6.0 02/15/2018 1435   GLUCOSEU NEGATIVE 02/15/2018 1435   HGBUR NEGATIVE 02/15/2018 1435   BILIRUBINUR neg 01/14/2019 1435   KETONESUR NEGATIVE 02/15/2018 1435   PROTEINUR Positive (A) 01/14/2019 1435   PROTEINUR NEGATIVE 02/15/2018 1435   UROBILINOGEN 0.2 01/14/2019 1435   UROBILINOGEN 0.2 01/02/2018 1402   NITRITE neg 01/14/2019 1435   NITRITE NEGATIVE 02/15/2018 1435   LEUKOCYTESUR Negative 01/14/2019 1435   Sepsis Labs: @LABRCNTIP (procalcitonin:4,lacticidven:4)  ) Recent Results (from the past 240 hour(s))  Respiratory Panel by RT PCR (Flu A&B, Covid) - Nasopharyngeal Swab     Status: Abnormal   Collection Time: 12/21/19  6:00 PM   Specimen: Nasopharyngeal Swab  Result Value Ref Range Status   SARS Coronavirus 2 by RT PCR POSITIVE (A) NEGATIVE Final    Comment: RESULT CALLED TO, READ BACK BY AND VERIFIED WITH: P PULLIAM RN 12/21/19 1906 JDW (NOTE) SARS-CoV-2 target nucleic acids are DETECTED. SARS-CoV-2 RNA is generally detectable in upper respiratory specimens  during the acute phase of infection. Positive results are indicative of the presence of the identified virus, but do not rule out bacterial infection or co-infection with other pathogens  not detected by the test. Clinical correlation with patient history and other diagnostic information is necessary to determine patient infection status. The expected result is Negative. Fact Sheet for Patients:  PinkCheek.be Fact Sheet for Healthcare Providers: GravelBags.it This test is not yet approved or cleared by the [place] FDA and  has been authorized for detection and/or diagnosis of SARS-CoV-2 by FDA under an Emergency Use Authorization (EUA).  This EUA will remain in effect (meaning this test can be used) for the  duration of  the COVID-19 declaration under Section 564(b)(1) of the Act, 21 U.S.C. section 360bbb-3(b)(1), unless the authorization is terminated or revoked sooner.    Influenza A by PCR NEGATIVE NEGATIVE Final   Influenza B by PCR NEGATIVE NEGATIVE Final    Comment: (NOTE) The Xpert Xpress SARS-CoV-2/FLU/RSV assay is intended as an aid in  the diagnosis of influenza from Nasopharyngeal swab specimens and  should not be used as a sole basis for treatment. Nasal washings and  aspirates are unacceptable for Xpert Xpress SARS-CoV-2/FLU/RSV  testing. Fact Sheet for Patients: PinkCheek.be Fact Sheet for Healthcare Providers: GravelBags.it This test is not yet approved or cleared by the  [place] Architectural technologist and  has been authorized for detection and/or diagnosis of SARS-CoV-2 by  FDA under an Print production planner (EUA). This EUA will remain  in effect (meaning this test can be used) for the duration of the  Covid-19 declaration under Section 564(b)(1) of the Act, 21  U.S.C. section 360bbb-3(b)(1), unless the authorization is  terminated or revoked. Performed at [facility], [place] [address], [place], [place] [address]   Blood Culture (routine x 2)     Status: None (Preliminary result)   Collection Time: 12/21/19  8:14 PM   Specimen:  BLOOD  Result Value Ref Range Status   Specimen Description BLOOD LEFT ANTECUBITAL  Final   Special Requests   Final    BOTTLES DRAWN AEROBIC AND ANAEROBIC Blood Culture adequate volume   Culture   Final    NO GROWTH < 24 HOURS Performed at [facility], [place] [address] [place], [place] [address]    Report Status PENDING  Incomplete  Blood Culture (routine x 2)     Status: None (Preliminary result)   Collection Time: 12/21/19  8:24 PM   Specimen: BLOOD RIGHT HAND  Result Value Ref Range Status   Specimen Description BLOOD RIGHT HAND  Final   Special Requests   Final    BOTTLES DRAWN AEROBIC AND ANAEROBIC Blood Culture adequate volume   Culture   Final    NO GROWTH < 24 HOURS Performed at [facility], [place] [address] [place], [place] [address]    Report Status PENDING  Incomplete      Studies: DG Chest Portable 1 View  Result Date: 12/21/2019 CLINICAL DATA:  Hypoxia, vomiting EXAM: PORTABLE CHEST 1 VIEW COMPARISON:  08/13/2018 FINDINGS: Bilateral asymmetric mixed interstitial and airspace opacities, worse in the right mid lung compatible with asymmetric edema or pneumonia. Cardiac silhouette is obscured. No large effusion. Negative for pneumothorax. Trachea midline. Degenerative changes of the spine. IMPRESSION: Bilateral asymmetric interstitial and airspace opacities compatible with asymmetric edema versus pneumonia. Electronically Signed   By: Jerilynn Mages.  Shick M.D.   On: 12/21/2019 14:57    Scheduled Meds: . vitamin C  500 mg Oral Daily  . dexamethasone (DECADRON) injection  6 mg Intravenous Q24H  . insulin aspart  0-20 Units Subcutaneous TID WC  . insulin aspart  0-5 Units Subcutaneous QHS  . Ipratropium-Albuterol  1 puff Inhalation Q6H  . zinc sulfate  220 mg Oral Daily    Continuous Infusions: . azithromycin    . famotidine (PEPCID) IV    . heparin Stopped (12/22/19 0710)  . [START ON 12/23/2019] remdesivir 100 mg in NS 100 mL       LOS: 1 day     Annita Brod, MD [facility]  To reach me or the doctor on call, go to: [email or web address] Password [email or web address]  12/22/2019, 1:35 PM
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
## 2019-12-22 NOTE — Progress Notes (Addendum)
Pt lost IV access. Two RNs failed w/ additional placement. MD paged.     [id number] IV access obtained. remdesivir started.    0700 - Pt removed IV and is w/o IV access.

## 2019-12-23 LAB — CBC WITH DIFFERENTIAL/PLATELET
Abs Immature Granulocytes: 0.17 10*3/uL — ABNORMAL HIGH (ref 0.00–0.07)
Basophils Absolute: 0 10*3/uL (ref 0.0–0.1)
Basophils Relative: 0 %
Eosinophils Absolute: 0 10*3/uL (ref 0.0–0.5)
Eosinophils Relative: 0 %
HCT: 41.8 % (ref 39.0–52.0)
Hemoglobin: 13.6 g/dL (ref 13.0–17.0)
Immature Granulocytes: 2 %
Lymphocytes Relative: 7 %
Lymphs Abs: 0.8 10*3/uL (ref 0.7–4.0)
MCH: 28.8 pg (ref 26.0–34.0)
MCHC: 32.5 g/dL (ref 30.0–36.0)
MCV: 88.4 fL (ref 80.0–100.0)
Monocytes Absolute: 0.3 10*3/uL (ref 0.1–1.0)
Monocytes Relative: 3 %
Neutro Abs: 9.7 10*3/uL — ABNORMAL HIGH (ref 1.7–7.7)
Neutrophils Relative %: 88 %
Platelets: 166 10*3/uL (ref 150–400)
RBC: 4.73 MIL/uL (ref 4.22–5.81)
RDW: 13.2 % (ref 11.5–15.5)
WBC: 11 10*3/uL — ABNORMAL HIGH (ref 4.0–10.5)
nRBC: 0 % (ref 0.0–0.2)

## 2019-12-23 LAB — COMPREHENSIVE METABOLIC PANEL
ALT: 56 U/L — ABNORMAL HIGH (ref 0–44)
AST: 59 U/L — ABNORMAL HIGH (ref 15–41)
Albumin: 3.1 g/dL — ABNORMAL LOW (ref 3.5–5.0)
Alkaline Phosphatase: 77 U/L (ref 38–126)
Anion gap: 11 (ref 5–15)
BUN: 21 mg/dL (ref 8–23)
CO2: 33 mmol/L — ABNORMAL HIGH (ref 22–32)
Calcium: 8.8 mg/dL — ABNORMAL LOW (ref 8.9–10.3)
Chloride: 97 mmol/L — ABNORMAL LOW (ref 98–111)
Creatinine, Ser: 1 mg/dL (ref 0.61–1.24)
GFR calc Af Amer: >60 mL/min (ref >60)
GFR calc non Af Amer: >60 mL/min (ref >60)
Glucose, Bld: 163 mg/dL — ABNORMAL HIGH (ref 70–99)
Potassium: 4 mmol/L (ref 3.5–5.1)
Sodium: 141 mmol/L (ref 135–145)
Total Bilirubin: 0.5 mg/dL (ref 0.3–1.2)
Total Protein: 7.4 g/dL (ref 6.5–8.1)

## 2019-12-23 LAB — D-DIMER, QUANTITATIVE: D-Dimer, Quant: >20 ug/mL-FEU — ABNORMAL HIGH (ref 0.00–0.50)

## 2019-12-23 LAB — GLUCOSE, CAPILLARY
Glucose-Capillary: 133 mg/dL — ABNORMAL HIGH (ref 70–99)
Glucose-Capillary: 189 mg/dL — ABNORMAL HIGH (ref 70–99)
Glucose-Capillary: 132 mg/dL — ABNORMAL HIGH (ref 70–99)
Glucose-Capillary: 138 mg/dL — ABNORMAL HIGH (ref 70–99)
Glucose-Capillary: 135 mg/dL — ABNORMAL HIGH (ref 70–99)

## 2019-12-23 LAB — C-REACTIVE PROTEIN: CRP: 22.8 mg/dL — ABNORMAL HIGH (ref <1.0)

## 2019-12-23 LAB — HEPARIN LEVEL (UNFRACTIONATED): Heparin Unfractionated: 0.45 IU/mL (ref 0.30–0.70)

## 2019-12-23 MED ORDER — SALINE SPRAY 0.65 % NA SOLN
1.0000 | NASAL | Status: DC | PRN
  Administered 2019-12-23: 1 via NASAL
  Filled 2019-12-23 (×2): qty 44

## 2019-12-23 MED ORDER — ENOXAPARIN SODIUM 80 MG/0.8ML ~~LOC~~ SOLN
75.0000 mg | Freq: Two times a day (BID) | SUBCUTANEOUS | Status: DC
  Administered 2019-12-23 – 2019-12-25 (×5): 75 mg via SUBCUTANEOUS
  Filled 2019-12-23 (×5): qty 0.8

## 2019-12-23 NOTE — Progress Notes (Signed)
PROGRESS NOTE    Kenneth Davila  [id number] DOB: January 17, 1956 DOA: 12/21/2019 PCP: Patient, No Pcp Per    Brief Narrative:  64 year old gentleman with history of morbid obesity, diabetes and hypertension presented to the emergency room with several days of progressive shortness of breath and cough.  He was initially found hypoxic, needing 6 L of oxygen, chest x-ray shows infiltrates.  He subsequently required more oxygen.  Being admitted and treated as pneumonia due to COVID-19 virus.   Assessment & Plan:   Principal Problem:   Acute respiratory failure due to COVID-19 [facility]) Active Problems:   GERD (gastroesophageal reflux disease)   Chronic pain syndrome   History of depression   Tobacco dependence   Diabetes mellitus type 2, diet-controlled ([place])   Morbid obesity ([place])  Acute respiratory failure due to COVID-19 virus, pneumonia due to COVID-19: Continue to monitor due to significant symptoms, still remains on significant oxygen. chest physiotherapy, incentive spirometry, deep breathing exercises, sputum induction, mucolytic's and bronchodilators. Supplemental oxygen to keep saturations more than 90%. Covid directed therapy with , steroids, on dexamethasone remdesivir, day 2/5 actemra, 800 mg given on 12/21/2019 antibiotics, on azithromycin. Due to severity of symptoms, patient will need daily inflammatory markers, liver function test to monitor and direct COVID-19 therapies. CAT scan negative for PE, D-dimer very high, on aggressive dose of anticoagulation with Lovenox.  Chronic pain syndrome: On gabapentin.   DVT prophylaxis: Lovenox subcu Code Status: Full code Family Communication: None.  Patient talking to family Disposition Plan: Anticipate discharge home when patient is adequately stable.   Consultants:   None  Procedures:   None  Antimicrobials:  Antibiotics Given (last 72 hours)    Date/Time Action Medication Dose Rate   12/22/19 0615 New Bag/Given   remdesivir 200 mg in sodium chloride 0.9% 250 mL IVPB 200 mg 580 mL/hr   12/22/19 1526 Given   azithromycin (ZITHROMAX) tablet 500 mg 500 mg    12/23/19 1103 Given   azithromycin (ZITHROMAX) tablet 500 mg 500 mg    12/23/19 1221 New Bag/Given  [no IV access]   remdesivir 100 mg in sodium chloride 0.9 % 100 mL IVPB 100 mg 200 mL/hr         Subjective: Patient seen and examined.  Himself denies any complaints.  Remains on high flow oxygen.  He was on 15 L high flow nasal cannula oxygen on my evaluation.  Remains afebrile.  Objective: Vitals:   12/22/19 2347 12/23/19 0014 12/23/19 0348 12/23/19 1150  BP: 122/78  (!) 93/49 120/87  Pulse: 87  74 79  Resp: 18  19 15   Temp: 98.1 F (36.7 C)  98.4 F (36.9 C) 98.2 F (36.8 C)  TempSrc: Oral  Oral Oral  SpO2:  90% 94% 93%  Weight:      Height:        Intake/Output Summary (Last 24 hours) at 12/23/2019 1326 Last data filed at 12/23/2019 0600 Gross per 24 hour  Intake -  Output 750 ml  Net -750 ml   Filed Weights   12/21/19 1411  Weight: (!) 148.3 kg    Examination:  General exam: Appears calm and comfortable, however on high level of oxygen. Respiratory system: Clear to auscultation. Respiratory effort normal.  No added sound. Cardiovascular system: S1 & S2 heard, RRR. No JVD, murmurs, rubs, gallops or clicks. No pedal edema. Gastrointestinal system: Abdomen is nondistended, soft and nontender. No organomegaly or masses felt. Normal bowel sounds heard.  Obese and pendulous. Central nervous  system: Alert and oriented. No focal neurological deficits. Extremities: Symmetric 5 x 5 power. Skin: No rashes, lesions or ulcers Psychiatry: Judgement and insight appear normal. Mood & affect appropriate.     Data Reviewed: I have personally reviewed following labs and imaging studies  CBC: Recent Labs  Lab 12/21/19 1542 12/22/19 0705 12/23/19 0706  WBC 7.3 8.9 11.0*  NEUTROABS 6.0 7.6 9.7*  HGB 13.8 14.2 13.6  HCT 42.3 44.6  41.8  MCV 81.8 84.3 88.4  PLT 184 170 [id number]   Basic Metabolic Panel: Recent Labs  Lab 12/21/19 1542 12/22/19 0705 12/23/19 0706  NA 132* 138 141  K 3.8 3.8 4.0  CL 93* 94* 97*  CO2 24 29 33*  GLUCOSE 137* 160* 163*  BUN 11 13 21   CREATININE 1.17 1.07 1.00  CALCIUM 8.3* 8.7* 8.8*   GFR: Estimated Creatinine Clearance: 119.2 mL/min (by C-G formula based on SCr of 1 mg/dL). Liver Function Tests: Recent Labs  Lab 12/21/19 1542 12/22/19 0705 12/23/19 0706  AST 49* 51* 59*  ALT 34 37 56*  ALKPHOS 78 75 77  BILITOT 1.0 0.9 0.5  PROT 7.0 7.7 7.4  ALBUMIN 3.0* 3.4* 3.1*   No results for input(s): LIPASE, AMYLASE in the last 168 hours. No results for input(s): AMMONIA in the last 168 hours. Coagulation Profile: No results for input(s): INR, PROTIME in the last 168 hours. Cardiac Enzymes: No results for input(s): CKTOTAL, CKMB, CKMBINDEX, TROPONINI in the last 168 hours. BNP (last 3 results) No results for input(s): PROBNP in the last 8760 hours. HbA1C: Recent Labs    12/22/19 0705  HGBA1C 6.4*   CBG: Recent Labs  Lab 12/22/19 1124 12/22/19 1533 12/22/19 2105 12/23/19 0729 12/23/19 1126  GLUCAP 165* 122* 133* 189* 132*   Lipid Profile: Recent Labs    12/21/19 2014  TRIG 111   Thyroid Function Tests: No results for input(s): TSH, T4TOTAL, FREET4, T3FREE, THYROIDAB in the last 72 hours. Anemia Panel: Recent Labs    12/21/19 2014 12/22/19 0705  FERRITIN 166 165   Sepsis Labs: Recent Labs  Lab 12/21/19 2014 12/22/19 0705  PROCALCITON 0.21 0.38    Recent Results (from the past 240 hour(s))  Respiratory Panel by RT PCR (Flu A&B, Covid) - Nasopharyngeal Swab     Status: Abnormal   Collection Time: 12/21/19  6:00 PM   Specimen: Nasopharyngeal Swab  Result Value Ref Range Status   SARS Coronavirus 2 by RT PCR POSITIVE (A) NEGATIVE Final    Comment: RESULT CALLED TO, READ BACK BY AND VERIFIED WITH: P PULLIAM RN 12/21/19 1906 JDW (NOTE) SARS-CoV-2  target nucleic acids are DETECTED. SARS-CoV-2 RNA is generally detectable in upper respiratory specimens  during the acute phase of infection. Positive results are indicative of the presence of the identified virus, but do not rule out bacterial infection or co-infection with other pathogens not detected by the test. Clinical correlation with patient history and other diagnostic information is necessary to determine patient infection status. The expected result is Negative. Fact Sheet for Patients:  PinkCheek.be Fact Sheet for Healthcare Providers: GravelBags.it This test is not yet approved or cleared by the [place] FDA and  has been authorized for detection and/or diagnosis of SARS-CoV-2 by FDA under an Emergency Use Authorization (EUA).  This EUA will remain in effect (meaning this test can be used) for the  duration of  the COVID-19 declaration under Section 564(b)(1) of the Act, 21 U.S.C. section 360bbb-3(b)(1), unless the authorization is terminated or  revoked sooner.    Influenza A by PCR NEGATIVE NEGATIVE Final   Influenza B by PCR NEGATIVE NEGATIVE Final    Comment: (NOTE) The Xpert Xpress SARS-CoV-2/FLU/RSV assay is intended as an aid in  the diagnosis of influenza from Nasopharyngeal swab specimens and  should not be used as a sole basis for treatment. Nasal washings and  aspirates are unacceptable for Xpert Xpress SARS-CoV-2/FLU/RSV  testing. Fact Sheet for Patients: PinkCheek.be Fact Sheet for Healthcare Providers: GravelBags.it This test is not yet approved or cleared by the [place] FDA and  has been authorized for detection and/or diagnosis of SARS-CoV-2 by  FDA under an Emergency Use Authorization (EUA). This EUA will remain  in effect (meaning this test can be used) for the duration of the  Covid-19 declaration under Section 564(b)(1) of  the Act, 21  U.S.C. section 360bbb-3(b)(1), unless the authorization is  terminated or revoked. Performed at [facility], [place] [address], [place], [place] [address]   Blood Culture (routine x 2)     Status: None (Preliminary result)   Collection Time: 12/21/19  8:14 PM   Specimen: BLOOD  Result Value Ref Range Status   Specimen Description BLOOD LEFT ANTECUBITAL  Final   Special Requests   Final    BOTTLES DRAWN AEROBIC AND ANAEROBIC Blood Culture adequate volume   Culture   Final    NO GROWTH < 24 HOURS Performed at [facility], [place] [address] [place], [place] [address]    Report Status PENDING  Incomplete  Blood Culture (routine x 2)     Status: None (Preliminary result)   Collection Time: 12/21/19  8:24 PM   Specimen: BLOOD RIGHT HAND  Result Value Ref Range Status   Specimen Description BLOOD RIGHT HAND  Final   Special Requests   Final    BOTTLES DRAWN AEROBIC AND ANAEROBIC Blood Culture adequate volume   Culture   Final    NO GROWTH < 24 HOURS Performed at [facility], [place] [address], [place], [place] [address]    Report Status PENDING  Incomplete         Radiology Studies: CT ANGIO CHEST PE W OR WO CONTRAST  Result Date: 12/22/2019 CLINICAL DATA:  Respiratory failure with history of COVID-19 positivity EXAM: CT ANGIOGRAPHY CHEST WITH CONTRAST TECHNIQUE: Multidetector CT imaging of the chest was performed using the standard protocol during bolus administration of intravenous contrast. Multiplanar CT image reconstructions and MIPs were obtained to evaluate the vascular anatomy. CONTRAST:  126mL OMNIPAQUE IOHEXOL 350 MG/ML SOLN COMPARISON:  Chest x-ray from the previous day. FINDINGS: Cardiovascular: Thoracic aorta demonstrates no aneurysmal dilatation or atherosclerotic calcifications. Mild cardiac enlargement is seen. The pulmonary artery is well visualized with a normal branching pattern. No filling defect to suggest pulmonary embolism is noted. No  significant coronary calcifications are seen. Mediastinum/Nodes: Thoracic inlet is within normal limits. Scattered hilar and mediastinal lymph nodes are noted likely of a reactive nature. The esophagus as visualized is within normal limits. Lungs/Pleura: Lungs demonstrate diffuse ground-glass opacities throughout both lungs right slightly greater than left. No sizable effusion or pneumothorax is noted. No parenchymal nodules are seen. Upper Abdomen: No acute abnormality. Musculoskeletal: Degenerative changes of the thoracic spine are noted. No acute rib abnormality is seen. Review of the MIP images confirms the above findings. IMPRESSION: Diffuse ground-glass airspace opacities bilaterally right greater than left consistent with the given clinical history of COVID-19 positivity. Hilar and mediastinal adenopathy is seen likely  of a reactive nature. No evidence of pulmonary emboli. No other focal abnormality is noted. Electronically Signed   By: Inez Catalina M.D.   On: 12/22/2019 15:10   DG Chest Portable 1 View  Result Date: 12/21/2019 CLINICAL DATA:  Hypoxia, vomiting EXAM: PORTABLE CHEST 1 VIEW COMPARISON:  08/13/2018 FINDINGS: Bilateral asymmetric mixed interstitial and airspace opacities, worse in the right mid lung compatible with asymmetric edema or pneumonia. Cardiac silhouette is obscured. No large effusion. Negative for pneumothorax. Trachea midline. Degenerative changes of the spine. IMPRESSION: Bilateral asymmetric interstitial and airspace opacities compatible with asymmetric edema versus pneumonia. Electronically Signed   By: Jerilynn Mages.  Shick M.D.   On: 12/21/2019 14:57        Scheduled Meds: . vitamin C  500 mg Oral Daily  . azithromycin  500 mg Oral Daily  . dexamethasone (DECADRON) injection  6 mg Intravenous Q24H  . enoxaparin (LOVENOX) injection  75 mg Subcutaneous BID  . famotidine  20 mg Oral Daily  . insulin aspart  0-20 Units Subcutaneous TID WC  . insulin aspart  0-5 Units  Subcutaneous QHS  . Ipratropium-Albuterol  1 puff Inhalation Q6H  . zinc sulfate  220 mg Oral Daily   Continuous Infusions: . remdesivir 100 mg in NS 100 mL 100 mg (12/23/19 1221)     LOS: 2 days    Time spent: 25 minutes    Barb Merino, MD [facility] Pager [phone]

## 2019-12-23 NOTE — Progress Notes (Signed)
ANTICOAGULATION CONSULT NOTE - Initial Consult  Pharmacy Consult for heparin dosing Indication: r/o PE/DVT  Allergies  Allergen Reactions  . Amlodipine Nausea Only and Other (See Comments)    "Made my blood pressure go up"    Patient Measurements: Height: 6\' 2"  (188 cm) Weight: (!) 326 lb 15.1 oz (148.3 kg) IBW/kg (Calculated) : 82.2 Heparin Dosing Weight: HEPARIN DW (KG): 116.4  Vital Signs: Temp: 98.4 F (36.9 C) (01/06 0348) Temp Source: Oral (01/06 0348) BP: 93/49 (01/06 0348) Pulse Rate: 74 (01/06 0348)  Labs: Recent Labs    12/21/19 1542 12/21/19 1542 12/21/19 1806 12/22/19 0705 12/22/19 1103 12/22/19 2030 12/23/19 0706  HGB 13.8  --   --  14.2  --   --  13.6  HCT 42.3  --   --  44.6  --   --  41.8  PLT 184  --   --  170  --   --  166  HEPARINUNFRC  --    < >  --  0.58 0.23* 0.35 0.45  CREATININE 1.17  --   --  1.07  --   --  1.00  TROPONINIHS 62*  --  58*  --   --   --   --    < > = values in this interval not displayed.    Estimated Creatinine Clearance: 119.2 mL/min (by C-G formula based on SCr of 1 mg/dL).    Assessment: On heparin for r/o PE/DVT for D-dimer >20. Last heparin level remains therapeutic at 0.45. CT shows no evidence of PE. CBC stable.  Discussed with MD, will stop treatment dose and switch to prophylaxis Lovenox dosing. Ddimer still > 20 so will choose aggressive prophylaxis dosing.  Goal of Therapy:  Heparin level 0.3-0.7 units/ml Monitor platelets by anticoagulation protocol: Yes   Plan:  Stop heparin gtt Start enoxaparin 75mg  [place] Q12h Monitor D-dimer, CBC, s/s of bleed  Elenor Quinones, PharmD, BCPS, BCIDP Clinical Pharmacist 12/23/2019 10:19 AM

## 2019-12-24 ENCOUNTER — Inpatient Hospital Stay (HOSPITAL_COMMUNITY): Payer: Medicaid Other

## 2019-12-24 LAB — COMPREHENSIVE METABOLIC PANEL
ALT: 82 U/L — ABNORMAL HIGH (ref 0–44)
AST: 61 U/L — ABNORMAL HIGH (ref 15–41)
Albumin: 3 g/dL — ABNORMAL LOW (ref 3.5–5.0)
Alkaline Phosphatase: 71 U/L (ref 38–126)
Anion gap: 11 (ref 5–15)
BUN: 26 mg/dL — ABNORMAL HIGH (ref 8–23)
CO2: 33 mmol/L — ABNORMAL HIGH (ref 22–32)
Calcium: 9 mg/dL (ref 8.9–10.3)
Chloride: 95 mmol/L — ABNORMAL LOW (ref 98–111)
Creatinine, Ser: 1.19 mg/dL (ref 0.61–1.24)
GFR calc Af Amer: >60 mL/min (ref >60)
GFR calc non Af Amer: >60 mL/min (ref >60)
Glucose, Bld: 141 mg/dL — ABNORMAL HIGH (ref 70–99)
Potassium: 4 mmol/L (ref 3.5–5.1)
Sodium: 139 mmol/L (ref 135–145)
Total Bilirubin: 0.6 mg/dL (ref 0.3–1.2)
Total Protein: 6.9 g/dL (ref 6.5–8.1)

## 2019-12-24 LAB — CBC WITH DIFFERENTIAL/PLATELET
Abs Immature Granulocytes: 0.09 10*3/uL — ABNORMAL HIGH (ref 0.00–0.07)
Basophils Absolute: 0 10*3/uL (ref 0.0–0.1)
Basophils Relative: 0 %
Eosinophils Absolute: 0 10*3/uL (ref 0.0–0.5)
Eosinophils Relative: 0 %
HCT: 39.1 % (ref 39.0–52.0)
Hemoglobin: 13.2 g/dL (ref 13.0–17.0)
Immature Granulocytes: 1 %
Lymphocytes Relative: 8 %
Lymphs Abs: 0.8 10*3/uL (ref 0.7–4.0)
MCH: 29.7 pg (ref 26.0–34.0)
MCHC: 33.8 g/dL (ref 30.0–36.0)
MCV: 87.9 fL (ref 80.0–100.0)
Monocytes Absolute: 0.4 10*3/uL (ref 0.1–1.0)
Monocytes Relative: 4 %
Neutro Abs: 9 10*3/uL — ABNORMAL HIGH (ref 1.7–7.7)
Neutrophils Relative %: 87 %
Platelets: 188 10*3/uL (ref 150–400)
RBC: 4.45 MIL/uL (ref 4.22–5.81)
RDW: 13.2 % (ref 11.5–15.5)
WBC: 10.3 10*3/uL (ref 4.0–10.5)
nRBC: 0 % (ref 0.0–0.2)

## 2019-12-24 LAB — GLUCOSE, CAPILLARY
Glucose-Capillary: 155 mg/dL — ABNORMAL HIGH (ref 70–99)
Glucose-Capillary: 177 mg/dL — ABNORMAL HIGH (ref 70–99)
Glucose-Capillary: 138 mg/dL — ABNORMAL HIGH (ref 70–99)
Glucose-Capillary: 141 mg/dL — ABNORMAL HIGH (ref 70–99)

## 2019-12-24 LAB — D-DIMER, QUANTITATIVE: D-Dimer, Quant: 7.03 ug/mL-FEU — ABNORMAL HIGH (ref 0.00–0.50)

## 2019-12-24 LAB — C-REACTIVE PROTEIN: CRP: 12.4 mg/dL — ABNORMAL HIGH (ref <1.0)

## 2019-12-24 NOTE — Plan of Care (Signed)
Patient has been stable on 9-10LPM HFNC. Patient is up x1 assist to chair with dyspnea on exertion. Patient educated on deep breathing techniques and proning to assist with breathing. Acknowledged understanding. Call light in reach.   Problem: Consults Goal: RH GENERAL PATIENT EDUCATION Description: See Patient Education module for education specifics. 12/24/2019 1156 by Ria Clock, RN Outcome: Progressing 12/24/2019 1156 by Ria Clock, RN Outcome: Progressing Goal: Diabetes Guidelines if Diabetic/Glucose > 140 Description: If diabetic or lab glucose is > 140 mg/dl - Initiate Diabetes/Hyperglycemia Guidelines & Document Interventions  12/24/2019 1156 by Ria Clock, RN Outcome: Progressing 12/24/2019 1156 by Ria Clock, RN Outcome: Progressing   Problem: RH SKIN INTEGRITY Goal: RH STG SKIN FREE OF INFECTION/BREAKDOWN 12/24/2019 1156 by Ria Clock, RN Outcome: Progressing 12/24/2019 1156 by Ria Clock, RN Outcome: Progressing Goal: RH STG MAINTAIN SKIN INTEGRITY WITH ASSISTANCE Description: STG Maintain Skin Integrity With Assistance. 12/24/2019 1156 by Ria Clock, RN Outcome: Progressing 12/24/2019 1156 by Ria Clock, RN Outcome: Progressing   Problem: RH SAFETY Goal: RH STG ADHERE TO SAFETY PRECAUTIONS W/ASSISTANCE/DEVICE Description: STG Adhere to Safety Precautions With Assistance/Device. 12/24/2019 1156 by Ria Clock, RN Outcome: Progressing 12/24/2019 1156 by Ria Clock, RN Outcome: Progressing Goal: RH STG DECREASED RISK OF FALL WITH ASSISTANCE Description: STG Decreased Risk of Fall With Assistance. 12/24/2019 1156 by Ria Clock, RN Outcome: Progressing 12/24/2019 1156 by Ria Clock, RN Outcome: Progressing   Problem: RH PAIN MANAGEMENT Goal: RH STG PAIN MANAGED AT OR BELOW PT'S PAIN GOAL 12/24/2019 1156 by Ria Clock, RN Outcome: Progressing 12/24/2019 1156 by Ria Clock, RN Outcome: Progressing   Problem: RH KNOWLEDGE DEFICIT GENERAL Goal: RH  STG INCREASE KNOWLEDGE OF SELF CARE AFTER HOSPITALIZATION 12/24/2019 1156 by Ria Clock, RN Outcome: Progressing 12/24/2019 1156 by Ria Clock, RN Outcome: Progressing    Problem: Education: Goal: Knowledge of risk factors and measures for prevention of condition will improve 12/24/2019 1156 by Ria Clock, RN Outcome: Progressing 12/24/2019 1156 by Ria Clock, RN Outcome: Progressing   Problem: Coping: Goal: Psychosocial and spiritual needs will be supported 12/24/2019 1156 by Ria Clock, RN Outcome: Progressing 12/24/2019 1156 by Ria Clock, RN Outcome: Progressing   Problem: Respiratory: Goal: Will maintain a patent airway 12/24/2019 1156 by Ria Clock, RN Outcome: Progressing 12/24/2019 1156 by Ria Clock, RN Outcome: Progressing Goal: Complications related to the disease process, condition or treatment will be avoided or minimized 12/24/2019 1156 by Ria Clock, RN Outcome: Progressing 12/24/2019 1156 by Ria Clock, RN Outcome: Progressing   Problem: Education: Goal: Ability to demonstrate management of disease process will improve 12/24/2019 1156 by Ria Clock, RN Outcome: Progressing 12/24/2019 1156 by Ria Clock, RN Outcome: Progressing Goal: Ability to verbalize understanding of medication therapies will improve 12/24/2019 1156 by Ria Clock, RN Outcome: Progressing 12/24/2019 1156 by Ria Clock, RN Outcome: Progressing   Problem: Activity: Goal: Capacity to carry out activities will improve 12/24/2019 1156 by Ria Clock, RN Outcome: Progressing 12/24/2019 1156 by Ria Clock, RN Outcome: Progressing   Problem: Cardiac: Goal: Ability to achieve and maintain adequate cardiopulmonary perfusion will improve 12/24/2019 1156 by Ria Clock, RN Outcome: Progressing 12/24/2019 1156 by Ria Clock, RN Outcome: Progressing

## 2019-12-24 NOTE — Progress Notes (Signed)
Spoke with son to update on patient. Questions answered.

## 2019-12-24 NOTE — Progress Notes (Signed)
PROGRESS NOTE    Kenneth Davila  [id number] DOB: [birth year] DOA: 12/21/2019 PCP: Patient, No Pcp Per    Brief Narrative:  64 year old gentleman with history of morbid obesity, diabetes and hypertension presented to the emergency room with several days of progressive shortness of breath and cough.  He was initially found hypoxic, needing 6 L of oxygen, chest x-ray shows infiltrates.  He subsequently required more oxygen.  Being admitted and treated as pneumonia due to COVID-19 virus.   Assessment & Plan:   Principal Problem:   Acute respiratory failure due to COVID-19 [facility]) Active Problems:   GERD (gastroesophageal reflux disease)   Chronic pain syndrome   History of depression   Tobacco dependence   Diabetes mellitus type 2, diet-controlled ([place])   Morbid obesity ([place])  Acute respiratory failure due to COVID-19 virus, pneumonia due to COVID-19: Continue to monitor due to significant symptoms, still remains on significant oxygen. chest physiotherapy, incentive spirometry, deep breathing exercises, sputum induction, mucolytic's and bronchodilators. Supplemental oxygen to keep saturations more than 90%. Covid directed therapy with , steroids, on dexamethasone remdesivir, day 3/5 actemra, 800 mg given on 12/21/2019 antibiotics, on azithromycin, will continue for 5 days. Due to severity of symptoms, patient will need daily inflammatory markers, liver function test to monitor and direct COVID-19 therapies. CAT scan negative for PE, D-dimer very high, on aggressive dose of anticoagulation with Lovenox. Repeat chest x-ray today shows improving aeration of the lungs.  Chronic pain syndrome: On gabapentin.  We will continue.   DVT prophylaxis: Lovenox subcu Code Status: Full code Family Communication: None.  Patient talking to family Disposition Plan: Anticipate discharge home when patient is adequately stable.   Consultants:   None  Procedures:   None  Antimicrobials:    Antibiotics Given (last 72 hours)    Date/Time Action Medication Dose Rate   12/22/19 0615 New Bag/Given   remdesivir 200 mg in sodium chloride 0.9% 250 mL IVPB 200 mg 580 mL/hr   12/22/19 1526 Given   azithromycin (ZITHROMAX) tablet 500 mg 500 mg    12/23/19 1103 Given   azithromycin (ZITHROMAX) tablet 500 mg 500 mg    12/23/19 1221 New Bag/Given  [no IV access]   remdesivir 100 mg in sodium chloride 0.9 % 100 mL IVPB 100 mg 200 mL/hr   12/24/19 0936 New Bag/Given   remdesivir 100 mg in sodium chloride 0.9 % 100 mL IVPB 100 mg 200 mL/hr   12/24/19 [id number] Given   azithromycin (ZITHROMAX) tablet 500 mg 500 mg          Subjective: Patient seen and examined.  No overnight events.  He feels his breathing is better today.  Objective: Vitals:   12/23/19 2347 12/24/19 0410 12/24/19 0800 12/24/19 1200  BP: 101/71 113/62 121/76 (!) 107/56  Pulse: 80 84 71 88  Resp:  14 18 18   Temp:  98.6 F (37 C) 98.1 F (36.7 C) 98.6 F (37 C)  TempSrc:  Oral Oral Oral  SpO2: 96% 96% 96% 94%  Weight:      Height:        Intake/Output Summary (Last 24 hours) at 12/24/2019 1218 Last data filed at 12/24/2019 1000 Gross per 24 hour  Intake 100 ml  Output 1135 ml  Net -1035 ml   Filed Weights   12/21/19 1411  Weight: (!) 148.3 kg    Examination:  General exam: Appears calm and comfortable, on 8 to 10 L of oxygen.  Comfortably sitting in chair and eating  breakfast. Respiratory system: Clear to auscultation. Respiratory effort normal.  No added sound. Cardiovascular system: S1 & S2 heard, RRR. No JVD, murmurs, rubs, gallops or clicks. No pedal edema. Gastrointestinal system: Abdomen is nondistended, soft and nontender. No organomegaly or masses felt. Normal bowel sounds heard.  Obese and pendulous. Central nervous system: Alert and oriented. No focal neurological deficits. Extremities: Symmetric 5 x 5 power. Skin: No rashes, lesions or ulcers Psychiatry: Judgement and insight appear normal.  Mood & affect appropriate.     Data Reviewed: I have personally reviewed following labs and imaging studies  CBC: Recent Labs  Lab 12/21/19 1542 12/22/19 0705 12/23/19 0706 12/24/19 0143  WBC 7.3 8.9 11.0* 10.3  NEUTROABS 6.0 7.6 9.7* 9.0*  HGB 13.8 14.2 13.6 13.2  HCT 42.3 44.6 41.8 39.1  MCV 81.8 84.3 88.4 87.9  PLT 184 170 166 0000000   Basic Metabolic Panel: Recent Labs  Lab 12/21/19 1542 12/22/19 0705 12/23/19 0706 12/24/19 0143  NA 132* 138 141 139  K 3.8 3.8 4.0 4.0  CL 93* 94* 97* 95*  CO2 24 29 33* 33*  GLUCOSE 137* 160* 163* 141*  BUN 11 13 21  26*  CREATININE 1.17 1.07 1.00 1.19  CALCIUM 8.3* 8.7* 8.8* 9.0   GFR: Estimated Creatinine Clearance: 100.1 mL/min (by C-G formula based on SCr of 1.19 mg/dL). Liver Function Tests: Recent Labs  Lab 12/21/19 1542 12/22/19 0705 12/23/19 0706 12/24/19 0143  AST 49* 51* 59* 61*  ALT 34 37 56* 82*  ALKPHOS 78 75 77 71  BILITOT 1.0 0.9 0.5 0.6  PROT 7.0 7.7 7.4 6.9  ALBUMIN 3.0* 3.4* 3.1* 3.0*   No results for input(s): LIPASE, AMYLASE in the last 168 hours. No results for input(s): AMMONIA in the last 168 hours. Coagulation Profile: No results for input(s): INR, PROTIME in the last 168 hours. Cardiac Enzymes: No results for input(s): CKTOTAL, CKMB, CKMBINDEX, TROPONINI in the last 168 hours. BNP (last 3 results) No results for input(s): PROBNP in the last 8760 hours. HbA1C: Recent Labs    12/22/19 0705  HGBA1C 6.4*   CBG: Recent Labs  Lab 12/23/19 1126 12/23/19 1632 12/23/19 2028 12/24/19 0744 12/24/19 1141  GLUCAP 132* 138* 135* 155* 177*   Lipid Profile: Recent Labs    12/21/19 2014  TRIG 111   Thyroid Function Tests: No results for input(s): TSH, T4TOTAL, FREET4, T3FREE, THYROIDAB in the last 72 hours. Anemia Panel: Recent Labs    12/21/19 2014 12/22/19 0705  FERRITIN 166 165   Sepsis Labs: Recent Labs  Lab 12/21/19 2014 12/22/19 0705  PROCALCITON 0.21 0.38    Recent  Results (from the past 240 hour(s))  Respiratory Panel by RT PCR (Flu A&B, Covid) - Nasopharyngeal Swab     Status: Abnormal   Collection Time: 12/21/19  6:00 PM   Specimen: Nasopharyngeal Swab  Result Value Ref Range Status   SARS Coronavirus 2 by RT PCR POSITIVE (A) NEGATIVE Final    Comment: RESULT CALLED TO, READ BACK BY AND VERIFIED WITH: P PULLIAM RN 12/21/19 1906 JDW (NOTE) SARS-CoV-2 target nucleic acids are DETECTED. SARS-CoV-2 RNA is generally detectable in upper respiratory specimens  during the acute phase of infection. Positive results are indicative of the presence of the identified virus, but do not rule out bacterial infection or co-infection with other pathogens not detected by the test. Clinical correlation with patient history and other diagnostic information is necessary to determine patient infection status. The expected result is Negative. Fact Sheet for  Patients:  PinkCheek.be Fact Sheet for Healthcare Providers: GravelBags.it This test is not yet approved or cleared by the [place] FDA and  has been authorized for detection and/or diagnosis of SARS-CoV-2 by FDA under an Emergency Use Authorization (EUA).  This EUA will remain in effect (meaning this test can be used) for the  duration of  the COVID-19 declaration under Section 564(b)(1) of the Act, 21 U.S.C. section 360bbb-3(b)(1), unless the authorization is terminated or revoked sooner.    Influenza A by PCR NEGATIVE NEGATIVE Final   Influenza B by PCR NEGATIVE NEGATIVE Final    Comment: (NOTE) The Xpert Xpress SARS-CoV-2/FLU/RSV assay is intended as an aid in  the diagnosis of influenza from Nasopharyngeal swab specimens and  should not be used as a sole basis for treatment. Nasal washings and  aspirates are unacceptable for Xpert Xpress SARS-CoV-2/FLU/RSV  testing. Fact Sheet for Patients: PinkCheek.be Fact Sheet  for Healthcare Providers: GravelBags.it This test is not yet approved or cleared by the [place] FDA and  has been authorized for detection and/or diagnosis of SARS-CoV-2 by  FDA under an Emergency Use Authorization (EUA). This EUA will remain  in effect (meaning this test can be used) for the duration of the  Covid-19 declaration under Section 564(b)(1) of the Act, 21  U.S.C. section 360bbb-3(b)(1), unless the authorization is  terminated or revoked. Performed at [facility], [place] [address], [place], [place] [address]   Blood Culture (routine x 2)     Status: None (Preliminary result)   Collection Time: 12/21/19  8:14 PM   Specimen: BLOOD  Result Value Ref Range Status   Specimen Description BLOOD LEFT ANTECUBITAL  Final   Special Requests   Final    BOTTLES DRAWN AEROBIC AND ANAEROBIC Blood Culture adequate volume   Culture   Final    NO GROWTH 2 DAYS Performed at [facility], [place] [address], [place], [place] [address]    Report Status PENDING  Incomplete  Blood Culture (routine x 2)     Status: None (Preliminary result)   Collection Time: 12/21/19  8:24 PM   Specimen: BLOOD RIGHT HAND  Result Value Ref Range Status   Specimen Description BLOOD RIGHT HAND  Final   Special Requests   Final    BOTTLES DRAWN AEROBIC AND ANAEROBIC Blood Culture adequate volume   Culture   Final    NO GROWTH 2 DAYS Performed at [facility], [place] [address], [place], [place] [address]    Report Status PENDING  Incomplete         Radiology Studies: CT ANGIO CHEST PE W OR WO CONTRAST  Result Date: 12/22/2019 CLINICAL DATA:  Respiratory failure with history of COVID-19 positivity EXAM: CT ANGIOGRAPHY CHEST WITH CONTRAST TECHNIQUE: Multidetector CT imaging of the chest was performed using the standard protocol during bolus administration of intravenous contrast. Multiplanar CT image reconstructions and MIPs were obtained to evaluate the vascular  anatomy. CONTRAST:  18mL OMNIPAQUE IOHEXOL 350 MG/ML SOLN COMPARISON:  Chest x-ray from the previous day. FINDINGS: Cardiovascular: Thoracic aorta demonstrates no aneurysmal dilatation or atherosclerotic calcifications. Mild cardiac enlargement is seen. The pulmonary artery is well visualized with a normal branching pattern. No filling defect to suggest pulmonary embolism is noted. No significant coronary calcifications are seen. Mediastinum/Nodes: Thoracic inlet is within normal limits. Scattered hilar and mediastinal lymph nodes are noted likely of a reactive nature. The esophagus as visualized is within normal limits. Lungs/Pleura: Lungs demonstrate diffuse  ground-glass opacities throughout both lungs right slightly greater than left. No sizable effusion or pneumothorax is noted. No parenchymal nodules are seen. Upper Abdomen: No acute abnormality. Musculoskeletal: Degenerative changes of the thoracic spine are noted. No acute rib abnormality is seen. Review of the MIP images confirms the above findings. IMPRESSION: Diffuse ground-glass airspace opacities bilaterally right greater than left consistent with the given clinical history of COVID-19 positivity. Hilar and mediastinal adenopathy is seen likely of a reactive nature. No evidence of pulmonary emboli. No other focal abnormality is noted. Electronically Signed   By: Inez Catalina M.D.   On: 12/22/2019 15:10   DG CHEST PORT 1 VIEW  Result Date: 12/24/2019 CLINICAL DATA:  Evaluate pneumonia EXAM: PORTABLE CHEST 1 VIEW COMPARISON:  12/21/2019 FINDINGS: Bilateral interstitial and airspace opacities are improved from previous exam. Decrease airspace opacity in the right upper lobe and left base. Osseous structures are unremarkable. IMPRESSION: 1. Improving aeration to both lungs. 2. No new findings. 3. Findings consistent with resolving pneumonia. Recommend continued followup to ensure complete resolution. Electronically Signed   By: Kerby Moors M.D.   On:  12/24/2019 09:28        Scheduled Meds: . vitamin C  500 mg Oral Daily  . azithromycin  500 mg Oral Daily  . dexamethasone (DECADRON) injection  6 mg Intravenous Q24H  . enoxaparin (LOVENOX) injection  75 mg Subcutaneous BID  . famotidine  20 mg Oral Daily  . insulin aspart  0-20 Units Subcutaneous TID WC  . insulin aspart  0-5 Units Subcutaneous QHS  . Ipratropium-Albuterol  1 puff Inhalation Q6H  . zinc sulfate  220 mg Oral Daily   Continuous Infusions: . remdesivir 100 mg in NS 100 mL 100 mg (12/24/19 0936)     LOS: 3 days    Time spent: 25 minutes    Barb Merino, MD [facility] Pager [phone]

## 2019-12-25 ENCOUNTER — Inpatient Hospital Stay (HOSPITAL_COMMUNITY): Payer: Medicaid Other

## 2019-12-25 DIAGNOSIS — I82409 Acute embolism and thrombosis of unspecified deep veins of unspecified lower extremity: Secondary | ICD-10-CM | POA: Diagnosis not present

## 2019-12-25 DIAGNOSIS — U071 COVID-19: Secondary | ICD-10-CM

## 2019-12-25 DIAGNOSIS — I82451 Acute embolism and thrombosis of right peroneal vein: Secondary | ICD-10-CM

## 2019-12-25 DIAGNOSIS — R791 Abnormal coagulation profile: Secondary | ICD-10-CM

## 2019-12-25 LAB — CBC WITH DIFFERENTIAL/PLATELET
Abs Immature Granulocytes: 0.11 10*3/uL — ABNORMAL HIGH (ref 0.00–0.07)
Basophils Absolute: 0 10*3/uL (ref 0.0–0.1)
Basophils Relative: 0 %
Eosinophils Absolute: 0 10*3/uL (ref 0.0–0.5)
Eosinophils Relative: 0 %
HCT: 40.5 % (ref 39.0–52.0)
Hemoglobin: 13.4 g/dL (ref 13.0–17.0)
Immature Granulocytes: 1 %
Lymphocytes Relative: 9 %
Lymphs Abs: 0.9 10*3/uL (ref 0.7–4.0)
MCH: 29.6 pg (ref 26.0–34.0)
MCHC: 33.1 g/dL (ref 30.0–36.0)
MCV: 89.4 fL (ref 80.0–100.0)
Monocytes Absolute: 0.6 10*3/uL (ref 0.1–1.0)
Monocytes Relative: 6 %
Neutro Abs: 7.5 10*3/uL (ref 1.7–7.7)
Neutrophils Relative %: 84 %
Platelets: 203 10*3/uL (ref 150–400)
RBC: 4.53 MIL/uL (ref 4.22–5.81)
RDW: 13.2 % (ref 11.5–15.5)
WBC: 9 10*3/uL (ref 4.0–10.5)
nRBC: 0 % (ref 0.0–0.2)

## 2019-12-25 LAB — COMPREHENSIVE METABOLIC PANEL
ALT: 81 U/L — ABNORMAL HIGH (ref 0–44)
AST: 41 U/L (ref 15–41)
Albumin: 3.1 g/dL — ABNORMAL LOW (ref 3.5–5.0)
Alkaline Phosphatase: 69 U/L (ref 38–126)
Anion gap: 12 (ref 5–15)
BUN: 23 mg/dL (ref 8–23)
CO2: 32 mmol/L (ref 22–32)
Calcium: 8.8 mg/dL — ABNORMAL LOW (ref 8.9–10.3)
Chloride: 95 mmol/L — ABNORMAL LOW (ref 98–111)
Creatinine, Ser: 1.07 mg/dL (ref 0.61–1.24)
GFR calc Af Amer: >60 mL/min (ref >60)
GFR calc non Af Amer: >60 mL/min (ref >60)
Glucose, Bld: 123 mg/dL — ABNORMAL HIGH (ref 70–99)
Potassium: 4.2 mmol/L (ref 3.5–5.1)
Sodium: 139 mmol/L (ref 135–145)
Total Bilirubin: 0.6 mg/dL (ref 0.3–1.2)
Total Protein: 6.8 g/dL (ref 6.5–8.1)

## 2019-12-25 LAB — GLUCOSE, CAPILLARY
Glucose-Capillary: 105 mg/dL — ABNORMAL HIGH (ref 70–99)
Glucose-Capillary: 105 mg/dL — ABNORMAL HIGH (ref 70–99)
Glucose-Capillary: 171 mg/dL — ABNORMAL HIGH (ref 70–99)

## 2019-12-25 LAB — C-REACTIVE PROTEIN: CRP: 6.6 mg/dL — ABNORMAL HIGH (ref <1.0)

## 2019-12-25 LAB — D-DIMER, QUANTITATIVE: D-Dimer, Quant: 3.57 ug/mL-FEU — ABNORMAL HIGH (ref 0.00–0.50)

## 2019-12-25 MED ORDER — ENOXAPARIN SODIUM 150 MG/ML ~~LOC~~ SOLN
1.0000 mg/kg | Freq: Two times a day (BID) | SUBCUTANEOUS | Status: DC
  Administered 2019-12-25 – 2019-12-27 (×3): 150 mg via SUBCUTANEOUS
  Filled 2019-12-25 (×7): qty 1

## 2019-12-25 MED ORDER — ENOXAPARIN SODIUM 80 MG/0.8ML ~~LOC~~ SOLN: 75.0000 mg | SUBCUTANEOUS | Status: DC

## 2019-12-25 NOTE — Plan of Care (Signed)
Patient has been stable throughout shift. Currently on 7LPM HFNC and tolerating well. Call light in reach.  Problem: Consults Goal: RH GENERAL PATIENT EDUCATION Description: See Patient Education module for education specifics. Outcome: Progressing Goal: Skin Care Protocol Initiated - if Braden Score 18 or less Description: If consults are not indicated, leave blank or document N/A Outcome: Progressing Goal: Nutrition Consult-if indicated Outcome: Progressing Goal: Diabetes Guidelines if Diabetic/Glucose > 140 Description: If diabetic or lab glucose is > 140 mg/dl - Initiate Diabetes/Hyperglycemia Guidelines & Document Interventions  Outcome: Progressing   Problem: RH BOWEL ELIMINATION Goal: RH STG MANAGE BOWEL WITH ASSISTANCE Description: STG Manage Bowel with Assistance. Outcome: Progressing Goal: RH STG MANAGE BOWEL W/MEDICATION W/ASSISTANCE Description: STG Manage Bowel with Medication with Assistance. Outcome: Progressing   Problem: RH BLADDER ELIMINATION Goal: RH STG MANAGE BLADDER WITH ASSISTANCE Description: STG Manage Bladder With Assistance Outcome: Progressing Goal: RH STG MANAGE BLADDER WITH MEDICATION WITH ASSISTANCE Description: STG Manage Bladder With Medication With Assistance. Outcome: Progressing Goal: RH STG MANAGE BLADDER WITH EQUIPMENT WITH ASSISTANCE Description: STG Manage Bladder With Equipment With Assistance Outcome: Progressing   Problem: RH SKIN INTEGRITY Goal: RH STG SKIN FREE OF INFECTION/BREAKDOWN Outcome: Progressing Goal: RH STG MAINTAIN SKIN INTEGRITY WITH ASSISTANCE Description: STG Maintain Skin Integrity With Assistance. Outcome: Progressing Goal: RH STG ABLE TO PERFORM INCISION/WOUND CARE W/ASSISTANCE Description: STG Able To Perform Incision/Wound Care With Assistance. Outcome: Progressing   Problem: RH SAFETY Goal: RH STG ADHERE TO SAFETY PRECAUTIONS W/ASSISTANCE/DEVICE Description: STG Adhere to Safety Precautions With  Assistance/Device. Outcome: Progressing Goal: RH STG DECREASED RISK OF FALL WITH ASSISTANCE Description: STG Decreased Risk of Fall With Assistance. Outcome: Progressing   Problem: RH PAIN MANAGEMENT Goal: RH STG PAIN MANAGED AT OR BELOW PT'S PAIN GOAL Outcome: Progressing   Problem: RH KNOWLEDGE DEFICIT GENERAL Goal: RH STG INCREASE KNOWLEDGE OF SELF CARE AFTER HOSPITALIZATION Outcome: Progressing   Problem: RH Vision Goal: RH LTG Vision (Specify) Outcome: Progressing   Problem: RH Pre-functional/Other (Specify) Goal: RH LTG Pre-functional (Specify) Outcome: Progressing Goal: RH LTG Interdisciplinary (Specify) 1 Description: RH LTG Interdisciplinary (Specify)1 Outcome: Progressing Goal: RH LTG Interdisciplinary (Specify) 2 Description: RH LTG Interdisciplinary (Specify) 2  Outcome: Progressing   Problem: Education: Goal: Knowledge of risk factors and measures for prevention of condition will improve Outcome: Progressing   Problem: Coping: Goal: Psychosocial and spiritual needs will be supported Outcome: Progressing   Problem: Respiratory: Goal: Will maintain a patent airway Outcome: Progressing Goal: Complications related to the disease process, condition or treatment will be avoided or minimized Outcome: Progressing   Problem: Education: Goal: Ability to demonstrate management of disease process will improve Outcome: Progressing Goal: Ability to verbalize understanding of medication therapies will improve Outcome: Progressing Goal: Individualized Educational Video(s) Outcome: Progressing   Problem: Activity: Goal: Capacity to carry out activities will improve Outcome: Progressing   Problem: Cardiac: Goal: Ability to achieve and maintain adequate cardiopulmonary perfusion will improve Outcome: Progressing

## 2019-12-25 NOTE — Progress Notes (Signed)
PROGRESS NOTE    ARAV SCROGGIN  [id number] DOB: 04-11-1956 DOA: 12/21/2019 PCP: Patient, No Pcp Per    Brief Narrative:  64 year old gentleman with history of morbid obesity, diabetes and hypertension presented to the emergency room with several days of progressive shortness of breath and cough.  He was initially found hypoxic, needing 6 L of oxygen, chest x-ray shows infiltrates.  He subsequently required more oxygen.  Being admitted and treated as pneumonia due to COVID-19 virus.   Assessment & Plan:   Principal Problem:   Acute respiratory failure due to COVID-19 [facility]) Active Problems:   GERD (gastroesophageal reflux disease)   Chronic pain syndrome   History of depression   Tobacco dependence   Diabetes mellitus type 2, diet-controlled ([place])   Morbid obesity ([place])  Acute respiratory failure due to COVID-19 virus, pneumonia due to COVID-19: Continue to monitor due to significant symptoms, still remains on significant oxygen with some improvement today. chest physiotherapy, incentive spirometry, deep breathing exercises, sputum induction, mucolytic's and bronchodilators. Supplemental oxygen to keep saturations more than 89 %. Covid directed therapy with , steroids, on dexamethasone remdesivir, day 4/5 actemra, 800 mg given on 12/21/2019 antibiotics, on azithromycin, will continue for 5 days. Due to severity of symptoms, patient will need daily inflammatory markers, liver function test to monitor and direct COVID-19 therapies. CAT scan negative for PE, D-dimer was very high and trending down.  On aggressive dose of anticoagulation with Lovenox.  Will check lower extremity duplexes.  Chronic pain syndrome: On gabapentin.  We will continue.   DVT prophylaxis: Lovenox subcu Code Status: Full code Family Communication: None.  Patient talking to family Disposition Plan: Anticipate discharge home when patient is adequately stable and comes off the oxygen.   Consultants:    None  Procedures:   None  Antimicrobials:  Antibiotics Given (last 72 hours)    Date/Time Action Medication Dose Rate   12/22/19 1526 Given   azithromycin (ZITHROMAX) tablet 500 mg 500 mg    12/23/19 1103 Given   azithromycin (ZITHROMAX) tablet 500 mg 500 mg    12/23/19 1221 New Bag/Given  [no IV access]   remdesivir 100 mg in sodium chloride 0.9 % 100 mL IVPB 100 mg 200 mL/hr   12/24/19 0936 New Bag/Given   remdesivir 100 mg in sodium chloride 0.9 % 100 mL IVPB 100 mg 200 mL/hr   12/24/19 0952 Given   azithromycin (ZITHROMAX) tablet 500 mg 500 mg    12/25/19 0900 New Bag/Given   remdesivir 100 mg in sodium chloride 0.9 % 100 mL IVPB 100 mg 200 mL/hr   12/25/19 0901 Given   azithromycin (ZITHROMAX) tablet 500 mg 500 mg          Subjective: No overnight events.  He thinks his breathing is better.  Objective: Vitals:   12/25/19 0230 12/25/19 0600 12/25/19 0700 12/25/19 0800  BP: 107/60 115/64  117/72  Pulse:   63 71  Resp:  16 16 13   Temp: 98.3 F (36.8 C) 97.9 F (36.6 C)  98 F (36.7 C)  TempSrc: Oral Oral  Oral  SpO2:   99% 99%  Weight:      Height:        Intake/Output Summary (Last 24 hours) at 12/25/2019 1103 Last data filed at 12/25/2019 1048 Gross per 24 hour  Intake --  Output 2150 ml  Net -2150 ml   Filed Weights   12/21/19 1411  Weight: (!) 148.3 kg    Examination:  General exam:  Appears calm and comfortable, on 7 L oxygen.   Respiratory system: Clear to auscultation. Respiratory effort normal.  No added sound. Cardiovascular system: S1 & S2 heard, RRR. No JVD, murmurs, rubs, gallops or clicks. No pedal edema. Gastrointestinal system: Abdomen is nondistended, soft and nontender. No organomegaly or masses felt. Normal bowel sounds heard.  Obese and pendulous. Central nervous system: Alert and oriented. No focal neurological deficits. Extremities: Symmetric 5 x 5 power. Skin: No rashes, lesions or ulcers Psychiatry: Judgement and insight  appear normal. Mood & affect appropriate.     Data Reviewed: I have personally reviewed following labs and imaging studies  CBC: Recent Labs  Lab 12/21/19 1542 12/22/19 0705 12/23/19 0706 12/24/19 0143 12/25/19 0044  WBC 7.3 8.9 11.0* 10.3 9.0  NEUTROABS 6.0 7.6 9.7* 9.0* 7.5  HGB 13.8 14.2 13.6 13.2 13.4  HCT 42.3 44.6 41.8 39.1 40.5  MCV 81.8 84.3 88.4 87.9 89.4  PLT 184 170 166 188 [id number]   Basic Metabolic Panel: Recent Labs  Lab 12/21/19 1542 12/22/19 0705 12/23/19 0706 12/24/19 0143 12/25/19 0044  NA 132* 138 141 139 139  K 3.8 3.8 4.0 4.0 4.2  CL 93* 94* 97* 95* 95*  CO2 24 29 33* 33* 32  GLUCOSE 137* 160* 163* 141* 123*  BUN 11 13 21  26* 23  CREATININE 1.17 1.07 1.00 1.19 1.07  CALCIUM 8.3* 8.7* 8.8* 9.0 8.8*   GFR: Estimated Creatinine Clearance: 111.4 mL/min (by C-G formula based on SCr of 1.07 mg/dL). Liver Function Tests: Recent Labs  Lab 12/21/19 1542 12/22/19 0705 12/23/19 0706 12/24/19 0143 12/25/19 0044  AST 49* 51* 59* 61* 41  ALT 34 37 56* 82* 81*  ALKPHOS 78 75 77 71 69  BILITOT 1.0 0.9 0.5 0.6 0.6  PROT 7.0 7.7 7.4 6.9 6.8  ALBUMIN 3.0* 3.4* 3.1* 3.0* 3.1*   No results for input(s): LIPASE, AMYLASE in the last 168 hours. No results for input(s): AMMONIA in the last 168 hours. Coagulation Profile: No results for input(s): INR, PROTIME in the last 168 hours. Cardiac Enzymes: No results for input(s): CKTOTAL, CKMB, CKMBINDEX, TROPONINI in the last 168 hours. BNP (last 3 results) No results for input(s): PROBNP in the last 8760 hours. HbA1C: No results for input(s): HGBA1C in the last 72 hours. CBG: Recent Labs  Lab 12/24/19 0744 12/24/19 1141 12/24/19 1550 12/24/19 2027 12/25/19 0804  GLUCAP 155* 177* 138* 141* 105*   Lipid Profile: No results for input(s): CHOL, HDL, LDLCALC, TRIG, CHOLHDL, LDLDIRECT in the last 72 hours. Thyroid Function Tests: No results for input(s): TSH, T4TOTAL, FREET4, T3FREE, THYROIDAB in the last 72  hours. Anemia Panel: No results for input(s): VITAMINB12, FOLATE, FERRITIN, TIBC, IRON, RETICCTPCT in the last 72 hours. Sepsis Labs: Recent Labs  Lab 12/21/19 2014 12/22/19 0705  PROCALCITON 0.21 0.38    Recent Results (from the past 240 hour(s))  Respiratory Panel by RT PCR (Flu A&B, Covid) - Nasopharyngeal Swab     Status: Abnormal   Collection Time: 12/21/19  6:00 PM   Specimen: Nasopharyngeal Swab  Result Value Ref Range Status   SARS Coronavirus 2 by RT PCR POSITIVE (A) NEGATIVE Final    Comment: RESULT CALLED TO, READ BACK BY AND VERIFIED WITH: P PULLIAM RN 12/21/19 1906 JDW (NOTE) SARS-CoV-2 target nucleic acids are DETECTED. SARS-CoV-2 RNA is generally detectable in upper respiratory specimens  during the acute phase of infection. Positive results are indicative of the presence of the identified virus, but do not rule out  bacterial infection or co-infection with other pathogens not detected by the test. Clinical correlation with patient history and other diagnostic information is necessary to determine patient infection status. The expected result is Negative. Fact Sheet for Patients:  PinkCheek.be Fact Sheet for Healthcare Providers: GravelBags.it This test is not yet approved or cleared by the [place] FDA and  has been authorized for detection and/or diagnosis of SARS-CoV-2 by FDA under an Emergency Use Authorization (EUA).  This EUA will remain in effect (meaning this test can be used) for the  duration of  the COVID-19 declaration under Section 564(b)(1) of the Act, 21 U.S.C. section 360bbb-3(b)(1), unless the authorization is terminated or revoked sooner.    Influenza A by PCR NEGATIVE NEGATIVE Final   Influenza B by PCR NEGATIVE NEGATIVE Final    Comment: (NOTE) The Xpert Xpress SARS-CoV-2/FLU/RSV assay is intended as an aid in  the diagnosis of influenza from Nasopharyngeal swab specimens and   should not be used as a sole basis for treatment. Nasal washings and  aspirates are unacceptable for Xpert Xpress SARS-CoV-2/FLU/RSV  testing. Fact Sheet for Patients: PinkCheek.be Fact Sheet for Healthcare Providers: GravelBags.it This test is not yet approved or cleared by the [place] FDA and  has been authorized for detection and/or diagnosis of SARS-CoV-2 by  FDA under an Emergency Use Authorization (EUA). This EUA will remain  in effect (meaning this test can be used) for the duration of the  Covid-19 declaration under Section 564(b)(1) of the Act, 21  U.S.C. section 360bbb-3(b)(1), unless the authorization is  terminated or revoked. Performed at [facility], [place] [address], [place], [place] [address]   Blood Culture (routine x 2)     Status: None (Preliminary result)   Collection Time: 12/21/19  8:14 PM   Specimen: BLOOD  Result Value Ref Range Status   Specimen Description BLOOD LEFT ANTECUBITAL  Final   Special Requests   Final    BOTTLES DRAWN AEROBIC AND ANAEROBIC Blood Culture adequate volume   Culture   Final    NO GROWTH 4 DAYS Performed at [facility], [place] [address], [place], [place] [address]    Report Status PENDING  Incomplete  Blood Culture (routine x 2)     Status: None (Preliminary result)   Collection Time: 12/21/19  8:24 PM   Specimen: BLOOD RIGHT HAND  Result Value Ref Range Status   Specimen Description BLOOD RIGHT HAND  Final   Special Requests   Final    BOTTLES DRAWN AEROBIC AND ANAEROBIC Blood Culture adequate volume   Culture   Final    NO GROWTH 4 DAYS Performed at [facility], [place] [address], [place], [place] [address]    Report Status PENDING  Incomplete         Radiology Studies: DG CHEST PORT 1 VIEW  Result Date: 12/24/2019 CLINICAL DATA:  Evaluate pneumonia EXAM: PORTABLE CHEST 1 VIEW COMPARISON:  12/21/2019 FINDINGS: Bilateral interstitial and  airspace opacities are improved from previous exam. Decrease airspace opacity in the right upper lobe and left base. Osseous structures are unremarkable. IMPRESSION: 1. Improving aeration to both lungs. 2. No new findings. 3. Findings consistent with resolving pneumonia. Recommend continued followup to ensure complete resolution. Electronically Signed   By: Kerby Moors M.D.   On: 12/24/2019 09:28        Scheduled Meds: . vitamin C  500 mg Oral Daily  . azithromycin  500 mg Oral Daily  . dexamethasone (  DECADRON) injection  6 mg Intravenous Q24H  . [START ON 12/26/2019] enoxaparin (LOVENOX) injection  75 mg Subcutaneous Q24H  . famotidine  20 mg Oral Daily  . insulin aspart  0-20 Units Subcutaneous TID WC  . insulin aspart  0-5 Units Subcutaneous QHS  . Ipratropium-Albuterol  1 puff Inhalation Q6H  . zinc sulfate  220 mg Oral Daily   Continuous Infusions: . remdesivir 100 mg in NS 100 mL 100 mg (12/25/19 0900)     LOS: 4 days    Time spent: 25 minutes    Barb Merino, MD [facility] Pager [phone]
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
## 2019-12-25 NOTE — Progress Notes (Signed)
CRITICAL VALUE ALERT  Critical Value:  + for DVT to right calf  Date & Time Notied:  2:33 PM 12/25/19  Provider Notified: Dr Sloan Leiter  Orders Received/Actions taken: lovenox dose to be changed

## 2019-12-25 NOTE — Progress Notes (Signed)
Bilateral lower extremity venous duplex completed.  Critical results discussed with Dr. Sloan Leiter and Amy, RN.  12/25/2019 2:27 PM Kelby Aline., MHA, RVT, RDCS, RDMS

## 2019-12-26 LAB — COMPREHENSIVE METABOLIC PANEL
ALT: 76 U/L — ABNORMAL HIGH (ref 0–44)
AST: 31 U/L (ref 15–41)
Albumin: 3.1 g/dL — ABNORMAL LOW (ref 3.5–5.0)
Alkaline Phosphatase: 63 U/L (ref 38–126)
Anion gap: 10 (ref 5–15)
BUN: 21 mg/dL (ref 8–23)
CO2: 34 mmol/L — ABNORMAL HIGH (ref 22–32)
Calcium: 9 mg/dL (ref 8.9–10.3)
Chloride: 96 mmol/L — ABNORMAL LOW (ref 98–111)
Creatinine, Ser: 1.07 mg/dL (ref 0.61–1.24)
GFR calc Af Amer: >60 mL/min (ref >60)
GFR calc non Af Amer: >60 mL/min (ref >60)
Glucose, Bld: 125 mg/dL — ABNORMAL HIGH (ref 70–99)
Potassium: 4.7 mmol/L (ref 3.5–5.1)
Sodium: 140 mmol/L (ref 135–145)
Total Bilirubin: 0.5 mg/dL (ref 0.3–1.2)
Total Protein: 6.7 g/dL (ref 6.5–8.1)

## 2019-12-26 LAB — CBC WITH DIFFERENTIAL/PLATELET
Abs Immature Granulocytes: 0.12 10*3/uL — ABNORMAL HIGH (ref 0.00–0.07)
Basophils Absolute: 0 10*3/uL (ref 0.0–0.1)
Basophils Relative: 0 %
Eosinophils Absolute: 0.1 10*3/uL (ref 0.0–0.5)
Eosinophils Relative: 1 %
HCT: 38.4 % — ABNORMAL LOW (ref 39.0–52.0)
Hemoglobin: 13.6 g/dL (ref 13.0–17.0)
Immature Granulocytes: 1 %
Lymphocytes Relative: 11 %
Lymphs Abs: 0.9 10*3/uL (ref 0.7–4.0)
MCH: 31.7 pg (ref 26.0–34.0)
MCHC: 35.4 g/dL (ref 30.0–36.0)
MCV: 89.5 fL (ref 80.0–100.0)
Monocytes Absolute: 0.6 10*3/uL (ref 0.1–1.0)
Monocytes Relative: 7 %
Neutro Abs: 6.7 10*3/uL (ref 1.7–7.7)
Neutrophils Relative %: 80 %
Platelets: 218 10*3/uL (ref 150–400)
RBC: 4.29 MIL/uL (ref 4.22–5.81)
RDW: 13.2 % (ref 11.5–15.5)
WBC: 8.4 10*3/uL (ref 4.0–10.5)
nRBC: 0 % (ref 0.0–0.2)

## 2019-12-26 LAB — GLUCOSE, CAPILLARY
Glucose-Capillary: 110 mg/dL — ABNORMAL HIGH (ref 70–99)
Glucose-Capillary: 138 mg/dL — ABNORMAL HIGH (ref 70–99)

## 2019-12-26 LAB — CULTURE, BLOOD (ROUTINE X 2)
Culture: NO GROWTH
Culture: NO GROWTH
Special Requests: ADEQUATE
Special Requests: ADEQUATE

## 2019-12-26 LAB — C-REACTIVE PROTEIN: CRP: 3.4 mg/dL — ABNORMAL HIGH (ref <1.0)

## 2019-12-26 LAB — D-DIMER, QUANTITATIVE: D-Dimer, Quant: 2.41 ug/mL-FEU — ABNORMAL HIGH (ref 0.00–0.50)

## 2019-12-26 NOTE — Progress Notes (Signed)
Report called to [facility] 3rd floor.  At [phone].

## 2019-12-26 NOTE — Plan of Care (Addendum)
Patient had very good day.  Patient states he is feling stronger.    Patient had BM this afternoon large amount, then chg bath, complete bed linen change.   Changed to regular [place], 5 L, patient sats improved, weaned down to 3L, sats remain a steady 97%/   Will call report to [facility] RN 3 rd floor for patient being transferred to 3rd floor.  Now down to 2L sating 97-100%.   Problem: Consults Goal: RH GENERAL PATIENT EDUCATION Description: See Patient Education module for education specifics. Outcome: Progressing Goal: Skin Care Protocol Initiated - if Braden Score 18 or less Description: If consults are not indicated, leave blank or document N/A Outcome: Progressing Goal: Nutrition Consult-if indicated Outcome: Progressing Goal: Diabetes Guidelines if Diabetic/Glucose > 140 Description: If diabetic or lab glucose is > 140 mg/dl - Initiate Diabetes/Hyperglycemia Guidelines & Document Interventions  Outcome: Progressing   Problem: RH BOWEL ELIMINATION Goal: RH STG MANAGE BOWEL WITH ASSISTANCE Description: STG Manage Bowel with Assistance. Outcome: Progressing Goal: RH STG MANAGE BOWEL W/MEDICATION W/ASSISTANCE Description: STG Manage Bowel with Medication with Assistance. Outcome: Progressing   Problem: RH BLADDER ELIMINATION Goal: RH STG MANAGE BLADDER WITH ASSISTANCE Description: STG Manage Bladder With Assistance Outcome: Progressing Goal: RH STG MANAGE BLADDER WITH MEDICATION WITH ASSISTANCE Description: STG Manage Bladder With Medication With Assistance. Outcome: Progressing Goal: RH STG MANAGE BLADDER WITH EQUIPMENT WITH ASSISTANCE Description: STG Manage Bladder With Equipment With Assistance Outcome: Progressing   Problem: RH SKIN INTEGRITY Goal: RH STG SKIN FREE OF INFECTION/BREAKDOWN Outcome: Progressing Goal: RH STG MAINTAIN SKIN INTEGRITY WITH ASSISTANCE Description: STG Maintain Skin Integrity With Assistance. Outcome: Progressing Goal: RH STG ABLE TO PERFORM  INCISION/WOUND CARE W/ASSISTANCE Description: STG Able To Perform Incision/Wound Care With Assistance. Outcome: Progressing   Problem: RH SAFETY Goal: RH STG ADHERE TO SAFETY PRECAUTIONS W/ASSISTANCE/DEVICE Description: STG Adhere to Safety Precautions With Assistance/Device. Outcome: Progressing Goal: RH STG DECREASED RISK OF FALL WITH ASSISTANCE Description: STG Decreased Risk of Fall With Assistance. Outcome: Progressing   Problem: RH PAIN MANAGEMENT Goal: RH STG PAIN MANAGED AT OR BELOW PT'S PAIN GOAL Outcome: Progressing   Problem: RH KNOWLEDGE DEFICIT GENERAL Goal: RH STG INCREASE KNOWLEDGE OF SELF CARE AFTER HOSPITALIZATION Outcome: Progressing   Problem: RH Vision Goal: RH LTG Vision (Specify) Outcome: Progressing   Problem: RH Pre-functional/Other (Specify) Goal: RH LTG Pre-functional (Specify) Outcome: Progressing Goal: RH LTG Interdisciplinary (Specify) 1 Description: RH LTG Interdisciplinary (Specify)1 Outcome: Progressing Goal: RH LTG Interdisciplinary (Specify) 2 Description: RH LTG Interdisciplinary (Specify) 2  Outcome: Progressing   Problem: Education: Goal: Knowledge of risk factors and measures for prevention of condition will improve Outcome: Progressing   Problem: Coping: Goal: Psychosocial and spiritual needs will be supported Outcome: Progressing   Problem: Respiratory: Goal: Will maintain a patent airway Outcome: Progressing Goal: Complications related to the disease process, condition or treatment will be avoided or minimized Outcome: Progressing   Problem: Education: Goal: Ability to demonstrate management of disease process will improve Outcome: Progressing Goal: Ability to verbalize understanding of medication therapies will improve Outcome: Progressing Goal: Individualized Educational Video(s) Outcome: Progressing   Problem: Activity: Goal: Capacity to carry out activities will improve Outcome: Progressing   Problem:  Cardiac: Goal: Ability to achieve and maintain adequate cardiopulmonary perfusion will improve Outcome: Progressing

## 2019-12-26 NOTE — Progress Notes (Signed)
PROGRESS NOTE    Kenneth Davila  [id number] DOB: [birth year] DOA: 12/21/2019 PCP: Patient, No Pcp Per    Brief Narrative:  64 year old gentleman with history of morbid obesity, diabetes and hypertension presented to the emergency room with several days of progressive shortness of breath and cough.  He was initially found hypoxic, needing 6 L of oxygen, chest x-ray shows infiltrates.  He subsequently required more oxygen.  Being admitted and treated as pneumonia due to COVID-19 virus.   Assessment & Plan:   Principal Problem:   Acute respiratory failure due to COVID-19 [facility]) Active Problems:   GERD (gastroesophageal reflux disease)   Chronic pain syndrome   History of depression   Tobacco dependence   Diabetes mellitus type 2, diet-controlled ([place])   Morbid obesity ([place])   DVT (deep venous thrombosis) ([facility])  Acute respiratory failure due to COVID-19 virus, pneumonia due to COVID-19: Continue to monitor due to significant symptoms, oxygen requirement slightly improving.  Now on 5 to 6 L oxygen.  Continue with chest physiotherapy, incentive spirometry, deep breathing exercises, sputum induction, mucolytic's and bronchodilators. Supplemental oxygen to keep saturations more than 89 %. Covid directed therapy with , steroids, on dexamethasone remdesivir, day 5/5 actemra, 800 mg given on 12/21/2019 antibiotics, on azithromycin, will continue for 5 days.  Finishing therapy. Due to severity of symptoms, patient will need daily inflammatory markers, liver function test to monitor and direct COVID-19 therapies. CTA negative.  Found to have right lower extremity DVT.  Acute right lower extremity DVT: Started on therapeutic Lovenox.  Will change to oral anticoagulation on discharge.  Chronic pain syndrome: On gabapentin.  We will continue.   DVT prophylaxis: Lovenox Code Status: Full code Family Communication: None.  Patient talking to family Disposition Plan: Anticipate discharge home  when patient is adequately stable and comes off the oxygen. Discontinue telemetry.  Can move to MedSurg bed. Continue mobility.  Anticipate discharge home next 24 to 48 hours.   Consultants:   None  Procedures:   None  Antimicrobials:  Antibiotics Given (last 72 hours)    Date/Time Action Medication Dose Rate   12/24/19 0936 New Bag/Given   remdesivir 100 mg in sodium chloride 0.9 % 100 mL IVPB 100 mg 200 mL/hr   12/24/19 0952 Given   azithromycin (ZITHROMAX) tablet 500 mg 500 mg    12/25/19 0900 New Bag/Given   remdesivir 100 mg in sodium chloride 0.9 % 100 mL IVPB 100 mg 200 mL/hr   12/25/19 0901 Given   azithromycin (ZITHROMAX) tablet 500 mg 500 mg    12/26/19 0929 Given   azithromycin (ZITHROMAX) tablet 500 mg 500 mg    12/26/19 [id number] New Bag/Given   remdesivir 100 mg in sodium chloride 0.9 % 100 mL IVPB 100 mg 200 mL/hr         Subjective: No overnight events.  He thinks he is breathing better.  He is more worried about his brother who has COPD and lives at home.  Objective: Vitals:   12/26/19 0600 12/26/19 0610 12/26/19 0812 12/26/19 1206  BP:  129/74 (!) 143/88 123/73  Pulse: 68 70 82 71  Resp:    (!) 21  Temp:  97.8 F (36.6 C)  98.5 F (36.9 C)  TempSrc:  Oral  Oral  SpO2: 92% 96% 95% 94%  Weight:      Height:        Intake/Output Summary (Last 24 hours) at 12/26/2019 1449 Last data filed at 12/26/2019 1416 Gross per 24 hour  Intake 1040 ml  Output 2700 ml  Net -1660 ml   Filed Weights   12/21/19 1411  Weight: (!) 148.3 kg    Examination:  General exam: Appears calm and comfortable on 5 L oxygen. Respiratory system: Clear to auscultation. Respiratory effort normal.  No added sound. Cardiovascular system: S1 & S2 heard, RRR. No JVD, murmurs, rubs, gallops or clicks. No pedal edema. Gastrointestinal system: Abdomen is nondistended, soft and nontender. No organomegaly or masses felt. Normal bowel sounds heard.  Obese and pendulous. Central nervous  system: Alert and oriented. No focal neurological deficits. Extremities: Symmetric 5 x 5 power. Skin: No rashes, lesions or ulcers Psychiatry: Judgement and insight appear normal. Mood & affect appropriate.     Data Reviewed: I have personally reviewed following labs and imaging studies  CBC: Recent Labs  Lab 12/22/19 0705 12/23/19 0706 12/24/19 0143 12/25/19 0044 12/26/19 0252  WBC 8.9 11.0* 10.3 9.0 8.4  NEUTROABS 7.6 9.7* 9.0* 7.5 6.7  HGB 14.2 13.6 13.2 13.4 13.6  HCT 44.6 41.8 39.1 40.5 38.4*  MCV 84.3 88.4 87.9 89.4 89.5  PLT 170 166 188 203 [id number]   Basic Metabolic Panel: Recent Labs  Lab 12/22/19 0705 12/23/19 0706 12/24/19 0143 12/25/19 0044 12/26/19 0252  NA 138 141 139 139 140  K 3.8 4.0 4.0 4.2 4.7  CL 94* 97* 95* 95* 96*  CO2 29 33* 33* 32 34*  GLUCOSE 160* 163* 141* 123* 125*  BUN 13 21 26* 23 21  CREATININE 1.07 1.00 1.19 1.07 1.07  CALCIUM 8.7* 8.8* 9.0 8.8* 9.0   GFR: Estimated Creatinine Clearance: 111.4 mL/min (by C-G formula based on SCr of 1.07 mg/dL). Liver Function Tests: Recent Labs  Lab 12/22/19 0705 12/23/19 0706 12/24/19 0143 12/25/19 0044 12/26/19 0252  AST 51* 59* 61* 41 31  ALT 37 56* 82* 81* 76*  ALKPHOS 75 77 71 69 63  BILITOT 0.9 0.5 0.6 0.6 0.5  PROT 7.7 7.4 6.9 6.8 6.7  ALBUMIN 3.4* 3.1* 3.0* 3.1* 3.1*   No results for input(s): LIPASE, AMYLASE in the last 168 hours. No results for input(s): AMMONIA in the last 168 hours. Coagulation Profile: No results for input(s): INR, PROTIME in the last 168 hours. Cardiac Enzymes: No results for input(s): CKTOTAL, CKMB, CKMBINDEX, TROPONINI in the last 168 hours. BNP (last 3 results) No results for input(s): PROBNP in the last 8760 hours. HbA1C: No results for input(s): HGBA1C in the last 72 hours. CBG: Recent Labs  Lab 12/24/19 2027 12/25/19 0804 12/25/19 1152 12/25/19 1536 12/26/19 0807  GLUCAP 141* 105* 105* 171* 110*   Lipid Profile: No results for input(s): CHOL,  HDL, LDLCALC, TRIG, CHOLHDL, LDLDIRECT in the last 72 hours. Thyroid Function Tests: No results for input(s): TSH, T4TOTAL, FREET4, T3FREE, THYROIDAB in the last 72 hours. Anemia Panel: No results for input(s): VITAMINB12, FOLATE, FERRITIN, TIBC, IRON, RETICCTPCT in the last 72 hours. Sepsis Labs: Recent Labs  Lab 12/21/19 2014 12/22/19 0705  PROCALCITON 0.21 0.38    Recent Results (from the past 240 hour(s))  Respiratory Panel by RT PCR (Flu A&B, Covid) - Nasopharyngeal Swab     Status: Abnormal   Collection Time: 12/21/19  6:00 PM   Specimen: Nasopharyngeal Swab  Result Value Ref Range Status   SARS Coronavirus 2 by RT PCR POSITIVE (A) NEGATIVE Final    Comment: RESULT CALLED TO, READ BACK BY AND VERIFIED WITH: P PULLIAM RN 12/21/19 1906 JDW (NOTE) SARS-CoV-2 target nucleic acids are DETECTED. SARS-CoV-2 RNA is  generally detectable in upper respiratory specimens  during the acute phase of infection. Positive results are indicative of the presence of the identified virus, but do not rule out bacterial infection or co-infection with other pathogens not detected by the test. Clinical correlation with patient history and other diagnostic information is necessary to determine patient infection status. The expected result is Negative. Fact Sheet for Patients:  PinkCheek.be Fact Sheet for Healthcare Providers: GravelBags.it This test is not yet approved or cleared by the [place] FDA and  has been authorized for detection and/or diagnosis of SARS-CoV-2 by FDA under an Emergency Use Authorization (EUA).  This EUA will remain in effect (meaning this test can be used) for the  duration of  the COVID-19 declaration under Section 564(b)(1) of the Act, 21 U.S.C. section 360bbb-3(b)(1), unless the authorization is terminated or revoked sooner.    Influenza A by PCR NEGATIVE NEGATIVE Final   Influenza B by PCR NEGATIVE NEGATIVE  Final    Comment: (NOTE) The Xpert Xpress SARS-CoV-2/FLU/RSV assay is intended as an aid in  the diagnosis of influenza from Nasopharyngeal swab specimens and  should not be used as a sole basis for treatment. Nasal washings and  aspirates are unacceptable for Xpert Xpress SARS-CoV-2/FLU/RSV  testing. Fact Sheet for Patients: PinkCheek.be Fact Sheet for Healthcare Providers: GravelBags.it This test is not yet approved or cleared by the [place] FDA and  has been authorized for detection and/or diagnosis of SARS-CoV-2 by  FDA under an Emergency Use Authorization (EUA). This EUA will remain  in effect (meaning this test can be used) for the duration of the  Covid-19 declaration under Section 564(b)(1) of the Act, 21  U.S.C. section 360bbb-3(b)(1), unless the authorization is  terminated or revoked. Performed at [facility], [place] [address] [place], [place] [address]   Blood Culture (routine x 2)     Status: None   Collection Time: 12/21/19  8:14 PM   Specimen: BLOOD  Result Value Ref Range Status   Specimen Description BLOOD LEFT ANTECUBITAL  Final   Special Requests   Final    BOTTLES DRAWN AEROBIC AND ANAEROBIC Blood Culture adequate volume   Culture   Final    NO GROWTH 5 DAYS Performed at [facility], [place] [address] [place], [place] [address]    Report Status 12/26/2019 FINAL  Final  Blood Culture (routine x 2)     Status: None   Collection Time: 12/21/19  8:24 PM   Specimen: BLOOD RIGHT HAND  Result Value Ref Range Status   Specimen Description BLOOD RIGHT HAND  Final   Special Requests   Final    BOTTLES DRAWN AEROBIC AND ANAEROBIC Blood Culture adequate volume   Culture   Final    NO GROWTH 5 DAYS Performed at [facility], [place] [address], [place], [place] [address]    Report Status 12/26/2019 FINAL  Final         Radiology Studies: VAS [place] LOWER EXTREMITY VENOUS (DVT)  Result  Date: 12/25/2019  Lower Venous Study Indications: COVID positive, elevated d-dimer.  Comparison Study: No prior study. Performing Technologist: Maudry Mayhew MHA, RDMS, RVT, RDCS  Examination Guidelines: A complete evaluation includes B-mode imaging, spectral Doppler, color Doppler, and power Doppler as needed of all accessible portions of each vessel. Bilateral testing is considered an integral part of a complete examination. Limited examinations for reoccurring indications may be performed as noted.  +---------+---------------+---------+-----------+----------+--------------+ RIGHT    CompressibilityPhasicitySpontaneityPropertiesThrombus Aging +---------+---------------+---------+-----------+----------+--------------+  CFV      Full           Yes      Yes                                 +---------+---------------+---------+-----------+----------+--------------+ SFJ      Full                                                        +---------+---------------+---------+-----------+----------+--------------+ FV Prox  Full                                                        +---------+---------------+---------+-----------+----------+--------------+ FV Mid   Full                                                        +---------+---------------+---------+-----------+----------+--------------+ FV DistalFull                                                        +---------+---------------+---------+-----------+----------+--------------+ PFV      Full                                                        +---------+---------------+---------+-----------+----------+--------------+ POP      Full           Yes      Yes                                 +---------+---------------+---------+-----------+----------+--------------+ PTV      Full                    Yes                                  +---------+---------------+---------+-----------+----------+--------------+ PERO     None                    No                   Acute          +---------+---------------+---------+-----------+----------+--------------+  +---------+---------------+---------+-----------+----------+--------------+ LEFT     CompressibilityPhasicitySpontaneityPropertiesThrombus Aging +---------+---------------+---------+-----------+----------+--------------+ CFV      Full           Yes      Yes                                 +---------+---------------+---------+-----------+----------+--------------+  SFJ      Full                                                        +---------+---------------+---------+-----------+----------+--------------+ FV Prox  Full                                                        +---------+---------------+---------+-----------+----------+--------------+ FV Mid   Full                                                        +---------+---------------+---------+-----------+----------+--------------+ FV DistalFull                                                        +---------+---------------+---------+-----------+----------+--------------+ PFV      Full                                                        +---------+---------------+---------+-----------+----------+--------------+ POP      Full           Yes      Yes                                 +---------+---------------+---------+-----------+----------+--------------+ PTV      Full                                                        +---------+---------------+---------+-----------+----------+--------------+ PERO     Full                                                        +---------+---------------+---------+-----------+----------+--------------+   Summary: Right: Findings consistent with acute deep vein thrombosis involving the right peroneal veins. No cystic  structure found in the popliteal fossa. Left: There is no evidence of deep vein thrombosis in the lower extremity. No cystic structure found in the popliteal fossa.  *See table(s) above for measurements and observations.    Preliminary         Scheduled Meds: . vitamin C  500 mg Oral Daily  . azithromycin  500 mg Oral Daily  . dexamethasone (DECADRON) injection  6 mg Intravenous Q24H  . enoxaparin (LOVENOX) injection  1 mg/kg Subcutaneous Q12H  . famotidine  20 mg Oral Daily  .  insulin aspart  0-20 Units Subcutaneous TID WC  . insulin aspart  0-5 Units Subcutaneous QHS  . Ipratropium-Albuterol  1 puff Inhalation Q6H  . zinc sulfate  220 mg Oral Daily   Continuous Infusions:    LOS: 5 days    Time spent: 25 minutes    Barb Merino, MD [facility] Pager [phone]

## 2019-12-26 NOTE — Progress Notes (Signed)
Received patient to floor and resumed patient care. Patient is awake alert and oriented. Patient oriented to room and call bell in reach. Vitals are documented on flow sheet and are stable.

## 2019-12-27 LAB — GLUCOSE, CAPILLARY
Glucose-Capillary: 129 mg/dL — ABNORMAL HIGH (ref 70–99)
Glucose-Capillary: 139 mg/dL — ABNORMAL HIGH (ref 70–99)
Glucose-Capillary: 171 mg/dL — ABNORMAL HIGH (ref 70–99)

## 2019-12-27 LAB — TROPONIN I (HIGH SENSITIVITY)
Troponin I (High Sensitivity): 4 ng/L (ref <18)
Troponin I (High Sensitivity): 3 ng/L (ref <18)

## 2019-12-27 MED ORDER — APIXABAN 5 MG PO TABS: 5.0000 mg | ORAL_TABLET | Freq: Two times a day (BID) | ORAL | Status: DC

## 2019-12-27 MED ORDER — APIXABAN 5 MG PO TABS
10.0000 mg | ORAL_TABLET | Freq: Once | ORAL | Status: AC
  Administered 2019-12-27: 10 mg via ORAL
  Filled 2019-12-27: qty 2

## 2019-12-27 MED ORDER — APIXABAN 5 MG PO TABS
10.0000 mg | ORAL_TABLET | Freq: Two times a day (BID) | ORAL | Status: DC
  Administered 2019-12-28: 10 mg via ORAL
  Filled 2019-12-27: qty 2

## 2019-12-27 MED ORDER — DEXAMETHASONE 6 MG PO TABS
6.0000 mg | ORAL_TABLET | Freq: Every day | ORAL | Status: DC
  Administered 2019-12-27 – 2019-12-28 (×2): 6 mg via ORAL
  Filled 2019-12-27 (×2): qty 1

## 2019-12-27 NOTE — Progress Notes (Signed)
Kenneth Davila for apixaban Indication: DVT  Allergies  Allergen Reactions  . Amlodipine Nausea Only and Other (See Comments)    "Made my blood pressure go up"    Patient Measurements: Height: 6\' 2"  (188 cm) Weight: (!) 326 lb 15.1 oz (148.3 kg) IBW/kg (Calculated) : 82.2  Vital Signs: Temp: 98.5 F (36.9 C) (01/10 0738) Temp Source: Oral (01/10 0738) BP: 127/73 (01/10 0738) Pulse Rate: 80 (01/10 0738)  Labs: Recent Labs    12/25/19 0044 12/26/19 0252  HGB 13.4 13.6  HCT 40.5 38.4*  PLT 203 218  CREATININE 1.07 1.07    Estimated Creatinine Clearance: 111.4 mL/min (by C-G formula based on SCr of 1.07 mg/dL).   Assessment: 47 YOM with acute DVT and to started on full dose Lovenox, now to transition to apixaban. No bleeding noted, CBC stable. Last Lovenox was at 03:00 this morning.   Plan:  Discontinue Lovenox Apixaban 10 mg PO bid for 7d then 5 mg PO bid Education prior to discharge Pharmacy signing off - please re-consult if needed  Thank you for involving pharmacy in this patient's care.  Renold Genta, PharmD, BCPS Clinical Pharmacist Clinical phone for 12/27/2019 until 3:30p is [phone] 12/27/2019 1:23 PM  **Pharmacist phone directory can be found on [email or web address] listed under [place]**

## 2019-12-27 NOTE — Progress Notes (Signed)
Call placed to the patient's son and updates given.

## 2019-12-27 NOTE — Plan of Care (Signed)
  Problem: RH SAFETY Goal: RH STG ADHERE TO SAFETY PRECAUTIONS W/ASSISTANCE/DEVICE Description: STG Adhere to Safety Precautions With Assistance/Device. Outcome: Progressing   Problem: RH KNOWLEDGE DEFICIT GENERAL Goal: RH STG INCREASE KNOWLEDGE OF SELF CARE AFTER HOSPITALIZATION Outcome: Progressing   Problem: Education: Goal: Knowledge of risk factors and measures for prevention of condition will improve Outcome: Progressing

## 2019-12-27 NOTE — Progress Notes (Signed)
PROGRESS NOTE    Kenneth Davila  [id number] DOB: 1956/10/20 DOA: 12/21/2019 PCP: Patient, No Pcp Per    Brief Narrative:  64 year old gentleman with history of morbid obesity, diabetes and hypertension presented to the emergency room with several days of progressive shortness of breath and cough.  He was initially found hypoxic, needing 6 L of oxygen, chest x-ray shows infiltrates.  He subsequently required more oxygen.  Being admitted and treated as pneumonia due to COVID-19 virus. Also found to have acute DVT on the right leg.   Assessment & Plan:   Principal Problem:   Acute respiratory failure due to COVID-19 [facility]) Active Problems:   GERD (gastroesophageal reflux disease)   Chronic pain syndrome   History of depression   Tobacco dependence   Diabetes mellitus type 2, diet-controlled ([place])   Morbid obesity ([place])   DVT (deep venous thrombosis) (H[facility])  Acute respiratory failure due to COVID-19 virus, pneumonia due to COVID-19: Clinically improving today.  Continue to wean off oxygen.  Continue with chest physiotherapy, incentive spirometry, deep breathing exercises, sputum induction, mucolytic's and bronchodilators. Supplemental oxygen to keep saturations more than 89 %. Covid directed therapy with , steroids, on dexamethasone remdesivir, finished treatment. actemra, 800 mg given on 12/21/2019 antibiotics, on azithromycin, will continue for 5 days.  Discontinue today. CTA negative.  Found to have right lower extremity DVT.  Acute right lower extremity DVT: Started on therapeutic Lovenox.  Changed to Eliquis today.  Chronic pain syndrome: On gabapentin.  We will continue.  Mobilize.  Monitor for need for supplemental oxygen. Change IV steroids to oral steroids.  Change Lovenox to Eliquis.   DVT prophylaxis: Eliquis Code Status: Full code Family Communication: Patient's son on the phone Disposition Plan: Later today or tomorrow depends upon oxygen  need.   Consultants:   None  Procedures:   None  Antimicrobials:  Antibiotics Given (last 72 hours)    Date/Time Action Medication Dose Rate   12/25/19 0900 New Bag/Given   remdesivir 100 mg in sodium chloride 0.9 % 100 mL IVPB 100 mg 200 mL/hr   12/25/19 0901 Given   azithromycin (ZITHROMAX) tablet 500 mg 500 mg    12/26/19 0929 Given   azithromycin (ZITHROMAX) tablet 500 mg 500 mg    12/26/19 0938 New Bag/Given   remdesivir 100 mg in sodium chloride 0.9 % 100 mL IVPB 100 mg 200 mL/hr   12/27/19 0937 Given   azithromycin (ZITHROMAX) tablet 500 mg 500 mg          Subjective: No overnight events.  Currently on 2 L oxygen.  Eager to go home and take care of his brother who is on hemodialysis and also on oxygen. Objective: Vitals:   12/26/19 1957 12/27/19 0302 12/27/19 0738 12/27/19 1037  BP: 120/64 (!) 136/59 127/73   Pulse: 88 82 80   Resp: 18 18 20 18   Temp: 99.5 F (37.5 C) 99.2 F (37.3 C) 98.5 F (36.9 C)   TempSrc: Oral Oral Oral   SpO2: 92% 99% 91% 100%  Weight:      Height:        Intake/Output Summary (Last 24 hours) at 12/27/2019 1235 Last data filed at 12/27/2019 1017 Gross per 24 hour  Intake 800 ml  Output 2500 ml  Net -1700 ml   Filed Weights   12/21/19 1411  Weight: (!) 148.3 kg    Examination:  General exam: Appears calm and comfortable on 2 L oxygen. Respiratory system: Clear to auscultation. Respiratory effort  normal.  No added sound. Cardiovascular system: S1 & S2 heard, RRR. No JVD, murmurs, rubs, gallops or clicks. No pedal edema. Gastrointestinal system: Abdomen is nondistended, soft and nontender. No organomegaly or masses felt. Normal bowel sounds heard.  Obese and pendulous. Central nervous system: Alert and oriented. No focal neurological deficits. Extremities: Symmetric 5 x 5 power. Skin: No rashes, lesions or ulcers Psychiatry: Judgement and insight appear normal. Mood & affect appropriate.     Data Reviewed: I have  personally reviewed following labs and imaging studies  CBC: Recent Labs  Lab 12/22/19 0705 12/23/19 0706 12/24/19 0143 12/25/19 0044 12/26/19 0252  WBC 8.9 11.0* 10.3 9.0 8.4  NEUTROABS 7.6 9.7* 9.0* 7.5 6.7  HGB 14.2 13.6 13.2 13.4 13.6  HCT 44.6 41.8 39.1 40.5 38.4*  MCV 84.3 88.4 87.9 89.4 89.5  PLT 170 166 188 203 [id number]   Basic Metabolic Panel: Recent Labs  Lab 12/22/19 0705 12/23/19 0706 12/24/19 0143 12/25/19 0044 12/26/19 0252  NA 138 141 139 139 140  K 3.8 4.0 4.0 4.2 4.7  CL 94* 97* 95* 95* 96*  CO2 29 33* 33* 32 34*  GLUCOSE 160* 163* 141* 123* 125*  BUN 13 21 26* 23 21  CREATININE 1.07 1.00 1.19 1.07 1.07  CALCIUM 8.7* 8.8* 9.0 8.8* 9.0   GFR: Estimated Creatinine Clearance: 111.4 mL/min (by C-G formula based on SCr of 1.07 mg/dL). Liver Function Tests: Recent Labs  Lab 12/22/19 0705 12/23/19 0706 12/24/19 0143 12/25/19 0044 12/26/19 0252  AST 51* 59* 61* 41 31  ALT 37 56* 82* 81* 76*  ALKPHOS 75 77 71 69 63  BILITOT 0.9 0.5 0.6 0.6 0.5  PROT 7.7 7.4 6.9 6.8 6.7  ALBUMIN 3.4* 3.1* 3.0* 3.1* 3.1*   No results for input(s): LIPASE, AMYLASE in the last 168 hours. No results for input(s): AMMONIA in the last 168 hours. Coagulation Profile: No results for input(s): INR, PROTIME in the last 168 hours. Cardiac Enzymes: No results for input(s): CKTOTAL, CKMB, CKMBINDEX, TROPONINI in the last 168 hours. BNP (last 3 results) No results for input(s): PROBNP in the last 8760 hours. HbA1C: No results for input(s): HGBA1C in the last 72 hours. CBG: Recent Labs  Lab 12/25/19 1152 12/25/19 1536 12/26/19 0807 12/26/19 1956 12/27/19 0735  GLUCAP 105* 171* 110* 138* 129*   Lipid Profile: No results for input(s): CHOL, HDL, LDLCALC, TRIG, CHOLHDL, LDLDIRECT in the last 72 hours. Thyroid Function Tests: No results for input(s): TSH, T4TOTAL, FREET4, T3FREE, THYROIDAB in the last 72 hours. Anemia Panel: No results for input(s): VITAMINB12, FOLATE,  FERRITIN, TIBC, IRON, RETICCTPCT in the last 72 hours. Sepsis Labs: Recent Labs  Lab 12/21/19 2014 12/22/19 0705  PROCALCITON 0.21 0.38    Recent Results (from the past 240 hour(s))  Respiratory Panel by RT PCR (Flu A&B, Covid) - Nasopharyngeal Swab     Status: Abnormal   Collection Time: 12/21/19  6:00 PM   Specimen: Nasopharyngeal Swab  Result Value Ref Range Status   SARS Coronavirus 2 by RT PCR POSITIVE (A) NEGATIVE Final    Comment: RESULT CALLED TO, READ BACK BY AND VERIFIED WITH: P PULLIAM RN 12/21/19 1906 JDW (NOTE) SARS-CoV-2 target nucleic acids are DETECTED. SARS-CoV-2 RNA is generally detectable in upper respiratory specimens  during the acute phase of infection. Positive results are indicative of the presence of the identified virus, but do not rule out bacterial infection or co-infection with other pathogens not detected by the test. Clinical correlation with patient history  and other diagnostic information is necessary to determine patient infection status. The expected result is Negative. Fact Sheet for Patients:  PinkCheek.be Fact Sheet for Healthcare Providers: GravelBags.it This test is not yet approved or cleared by the [place] FDA and  has been authorized for detection and/or diagnosis of SARS-CoV-2 by FDA under an Emergency Use Authorization (EUA).  This EUA will remain in effect (meaning this test can be used) for the  duration of  the COVID-19 declaration under Section 564(b)(1) of the Act, 21 U.S.C. section 360bbb-3(b)(1), unless the authorization is terminated or revoked sooner.    Influenza A by PCR NEGATIVE NEGATIVE Final   Influenza B by PCR NEGATIVE NEGATIVE Final    Comment: (NOTE) The Xpert Xpress SARS-CoV-2/FLU/RSV assay is intended as an aid in  the diagnosis of influenza from Nasopharyngeal swab specimens and  should not be used as a sole basis for treatment. Nasal washings and   aspirates are unacceptable for Xpert Xpress SARS-CoV-2/FLU/RSV  testing. Fact Sheet for Patients: PinkCheek.be Fact Sheet for Healthcare Providers: GravelBags.it This test is not yet approved or cleared by the [place] FDA and  has been authorized for detection and/or diagnosis of SARS-CoV-2 by  FDA under an Emergency Use Authorization (EUA). This EUA will remain  in effect (meaning this test can be used) for the duration of the  Covid-19 declaration under Section 564(b)(1) of the Act, 21  U.S.C. section 360bbb-3(b)(1), unless the authorization is  terminated or revoked. Performed at [facility], [place] [address], [place], [place] [address]   Blood Culture (routine x 2)     Status: None   Collection Time: 12/21/19  8:14 PM   Specimen: BLOOD  Result Value Ref Range Status   Specimen Description BLOOD LEFT ANTECUBITAL  Final   Special Requests   Final    BOTTLES DRAWN AEROBIC AND ANAEROBIC Blood Culture adequate volume   Culture   Final    NO GROWTH 5 DAYS Performed at [facility], [place] [address], [place], [place] [address]    Report Status 12/26/2019 FINAL  Final  Blood Culture (routine x 2)     Status: None   Collection Time: 12/21/19  8:24 PM   Specimen: BLOOD RIGHT HAND  Result Value Ref Range Status   Specimen Description BLOOD RIGHT HAND  Final   Special Requests   Final    BOTTLES DRAWN AEROBIC AND ANAEROBIC Blood Culture adequate volume   Culture   Final    NO GROWTH 5 DAYS Performed at [facility], [place] [address], [place], [place] [address]    Report Status 12/26/2019 FINAL  Final         Radiology Studies: VAS [place] LOWER EXTREMITY VENOUS (DVT)  Result Date: 12/27/2019  Lower Venous Study Indications: COVID positive, elevated d-dimer.  Comparison Study: No prior study. Performing Technologist: Maudry Mayhew MHA, RDMS, RVT, RDCS  Examination Guidelines: A complete evaluation  includes B-mode imaging, spectral Doppler, color Doppler, and power Doppler as needed of all accessible portions of each vessel. Bilateral testing is considered an integral part of a complete examination. Limited examinations for reoccurring indications may be performed as noted.  +---------+---------------+---------+-----------+----------+--------------+ RIGHT    CompressibilityPhasicitySpontaneityPropertiesThrombus Aging +---------+---------------+---------+-----------+----------+--------------+ CFV      Full           Yes      Yes                                 +---------+---------------+---------+-----------+----------+--------------+  SFJ      Full                                                        +---------+---------------+---------+-----------+----------+--------------+ FV Prox  Full                                                        +---------+---------------+---------+-----------+----------+--------------+ FV Mid   Full                                                        +---------+---------------+---------+-----------+----------+--------------+ FV DistalFull                                                        +---------+---------------+---------+-----------+----------+--------------+ PFV      Full                                                        +---------+---------------+---------+-----------+----------+--------------+ POP      Full           Yes      Yes                                 +---------+---------------+---------+-----------+----------+--------------+ PTV      Full                    Yes                                 +---------+---------------+---------+-----------+----------+--------------+ PERO     None                    No                   Acute          +---------+---------------+---------+-----------+----------+--------------+   +---------+---------------+---------+-----------+----------+--------------+ LEFT     CompressibilityPhasicitySpontaneityPropertiesThrombus Aging +---------+---------------+---------+-----------+----------+--------------+ CFV      Full           Yes      Yes                                 +---------+---------------+---------+-----------+----------+--------------+ SFJ      Full                                                        +---------+---------------+---------+-----------+----------+--------------+  FV Prox  Full                                                        +---------+---------------+---------+-----------+----------+--------------+ FV Mid   Full                                                        +---------+---------------+---------+-----------+----------+--------------+ FV DistalFull                                                        +---------+---------------+---------+-----------+----------+--------------+ PFV      Full                                                        +---------+---------------+---------+-----------+----------+--------------+ POP      Full           Yes      Yes                                 +---------+---------------+---------+-----------+----------+--------------+ PTV      Full                                                        +---------+---------------+---------+-----------+----------+--------------+ PERO     Full                                                        +---------+---------------+---------+-----------+----------+--------------+   Summary: Right: Findings consistent with acute deep vein thrombosis involving the right peroneal veins. No cystic structure found in the popliteal fossa. Left: There is no evidence of deep vein thrombosis in the lower extremity. No cystic structure found in the popliteal fossa.  *See table(s) above for measurements and observations. Electronically  signed by Harold Barban MD on 12/27/2019 at 10:48:07 AM.    Final         Scheduled Meds: . vitamin C  500 mg Oral Daily  . azithromycin  500 mg Oral Daily  . dexamethasone (DECADRON) injection  6 mg Intravenous Q24H  . famotidine  20 mg Oral Daily  . insulin aspart  0-20 Units Subcutaneous TID WC  . insulin aspart  0-5 Units Subcutaneous QHS  . Ipratropium-Albuterol  1 puff Inhalation Q6H  . zinc sulfate  220 mg Oral Daily   Continuous Infusions:    LOS: 6 days    Time spent: 25 minutes    Barb Merino, MD [facility] Pager [phone]

## 2019-12-27 NOTE — Progress Notes (Addendum)
   12/27/19 1405  Vitals  BP 128/65  MAP (mmHg) 73  BP Location Right Arm  BP Method Automatic  Patient Position (if appropriate) Lying  Pulse Rate 100  Pulse Rate Source Monitor  Resp 18  Level of Consciousness  Level of Consciousness Alert  Oxygen Therapy  SpO2 97 %  O2 Device Room Air  Patient with chest pain 9/10Dr Ghimiore and rapid response RN called. 1st floor charge came to the floor. EKG obtaines and orders received for troponin levels. Patient placed on 2 liters oxygen. Patient now states that his pain is 3/10.

## 2019-12-28 LAB — GLUCOSE, CAPILLARY
Glucose-Capillary: 93 mg/dL (ref 70–99)
Glucose-Capillary: 101 mg/dL — ABNORMAL HIGH (ref 70–99)
Glucose-Capillary: 99 mg/dL (ref 70–99)
Glucose-Capillary: 89 mg/dL (ref 70–99)
Glucose-Capillary: 133 mg/dL — ABNORMAL HIGH (ref 70–99)

## 2019-12-28 MED ORDER — PNEUMOCOCCAL VAC POLYVALENT 25 MCG/0.5ML IJ INJ
0.5000 mL | INJECTION | INTRAMUSCULAR | Status: AC
  Administered 2019-12-28: 0.5 mL via INTRAMUSCULAR
  Filled 2019-12-28: qty 0.5

## 2019-12-28 MED ORDER — APIXABAN 5 MG PO TABS: ORAL_TABLET | ORAL | 2 refills | Status: DC

## 2019-12-28 NOTE — Plan of Care (Signed)
   Vital Signs MEWS/VS Documentation       12/27/2019 1405 12/27/2019 1532 12/27/2019 1536 12/27/2019 1939   MEWS Score:  0  0  0  0   MEWS Score Color:  Green  Green  Green  Green   Resp:  18  14  --  18   Pulse:  100  86  --  89   BP:  128/65  130/77  --  102/65   Temp:  99.3 F (37.4 C)  99.3 F (37.4 C)  --  98.8 F (37.1 C)   O2 Device:  Room Air  Nasal Cannula  --  Nasal Cannula   O2 Flow Rate (L/min):  --  2 L/min  --  3 L/min   Level of Consciousness:  Alert  Alert  Alert  Alert       BP 102/65 (BP Location: Right Arm)   Pulse 89   Temp 98.8 F (37.1 C) (Oral)   Resp 18   Ht 6\' 2"  (1.88 m)   Wt (!) 148.3 kg   SpO2 90%   BMI 41.98 kg/m   POC reviewed with patient, cont to self reposition, bumped oxyen to 3 litres to maintain sats above 90%        [employer] Chistine Dematteo 12/28/2019,12:16 AM

## 2019-12-28 NOTE — Progress Notes (Addendum)
Patient received pneumovax, PIV removed. Education on Eliquis given per Educator from another facility via telephone call. Stated understanding of education provided on Eliquis, pneumovax, and discharge.

## 2019-12-28 NOTE — Discharge Instructions (Addendum)
Community-Acquired Pneumonia, Adult Pneumonia is an infection of the lungs. It causes swelling in the airways of the lungs. Mucus and fluid may also build up inside the airways. One type of pneumonia can happen while a person is in a hospital. A different type can happen when a person is not in a hospital (community-acquired pneumonia).  What are the causes?  This condition is caused by germs (viruses, bacteria, or fungi). Some types of germs can be passed from one person to another. This can happen when you breathe in droplets from the cough or sneeze of an infected person. What increases the risk? You are more likely to develop this condition if you:  Have a long-term (chronic) disease, such as: ? Chronic obstructive pulmonary disease (COPD). ? Asthma. ? Cystic fibrosis. ? Congestive heart failure. ? Diabetes. ? Kidney disease.  Have HIV.  Have sickle cell disease.  Have had your spleen removed.  Do not take good care of your teeth and mouth (poor dental hygiene).  Have a medical condition that increases the risk of breathing in droplets from your own mouth and nose.  Have a weakened body defense system (immune system).  Are a smoker.  Travel to areas where the germs that cause this illness are common.  Are around certain animals or the places they live. What are the signs or symptoms?  A dry cough.  A wet (productive) cough.  Fever.  Sweating.  Chest pain. This often happens when breathing deeply or coughing.  Fast breathing or trouble breathing.  Shortness of breath.  Shaking chills.  Feeling tired (fatigue).  Muscle aches. How is this treated? Treatment for this condition depends on many things. Most adults can be treated at home. In some cases, treatment must happen in a hospital. Treatment may include:  Medicines given by mouth or through an IV tube.  Being given extra oxygen.  Respiratory therapy. In rare cases, treatment for very bad pneumonia  may include:  Using a machine to help you breathe.  Having a procedure to remove fluid from around your lungs. Follow these instructions at home: Medicines  Take over-the-counter and prescription medicines only as told by your doctor. ? Only take cough medicine if you are losing sleep.  If you were prescribed an antibiotic medicine, take it as told by your doctor. Do not stop taking the antibiotic even if you start to feel better. General instructions   Sleep with your head and neck raised (elevated). You can do this by sleeping in a recliner or by putting a few pillows under your head.  Rest as needed. Get at least 8 hours of sleep each night.  Drink enough water to keep your pee (urine) pale yellow.  Eat a healthy diet that includes plenty of vegetables, fruits, whole grains, low-fat dairy products, and lean protein.  Do not use any products that contain nicotine or tobacco. These include cigarettes, e-cigarettes, and chewing tobacco. If you need help quitting, ask your doctor.  Keep all follow-up visits as told by your doctor. This is important. How is this prevented? A shot (vaccine) can help prevent pneumonia. Shots are often suggested for:  People older than 62 years of age.  People older than 62 years of age who: ? Are having cancer treatment. ? Have long-term (chronic) lung disease. ? Have problems with their body's defense system. You may also prevent pneumonia if you take these actions:  Get the flu (influenza) shot every year.  Go to the dentist as   often as told.  Wash your hands often. If you cannot use soap and water, use hand sanitizer. Contact a doctor if:  You have a fever.  You lose sleep because your cough medicine does not help. Get help right away if:  You are short of breath and it gets worse.  You have more chest pain.  Your sickness gets worse. This is very serious if: ? You are an older adult. ? Your body's defense system is weak.  You  cough up blood. Summary  Pneumonia is an infection of the lungs.  Most adults can be treated at home. Some will need treatment in a hospital.  Drink enough water to keep your pee pale yellow.  Get at least 8 hours of sleep each night. This information is not intended to replace advice given to you by your health care provider. Make sure you discuss any questions you have with your health care provider. Document Revised: 03/25/2019 Document Reviewed: 07/31/2018 Elsevier Patient Education  [place] on my medicine - ELIQUIS (apixaban)  This medication education was reviewed with me or my healthcare representative as part of my discharge preparation.  The pharmacist that spoke with me during my hospital stay was:  [facility], Margot Chimes, [facility]  Why was Eliquis prescribed for you? Eliquis was prescribed to treat blood clots that may have been found in the veins of your legs (deep vein thrombosis) or in your lungs (pulmonary embolism) and to reduce the risk of them occurring again.  What do You need to know about Eliquis ? The starting dose is 10 mg (two 5 mg tablets) taken TWICE daily for the FIRST SEVEN (7) DAYS, then on 01/03/20 (evening dose)  the dose is reduced to ONE 5 mg tablet taken TWICE daily.  Eliquis may be taken with or without food.   Try to take the dose about the same time in the morning and in the evening. If you have difficulty swallowing the tablet whole please discuss with your pharmacist how to take the medication safely.  Take Eliquis exactly as prescribed and DO NOT stop taking Eliquis without talking to the doctor who prescribed the medication.  Stopping may increase your risk of developing a new blood clot.  Refill your prescription before you run out.  After discharge, you should have regular check-up appointments with your healthcare provider that is prescribing your Eliquis.    What do you do if you miss a dose? If a dose of  ELIQUIS is not taken at the scheduled time, take it as soon as possible on the same day and twice-daily administration should be resumed. The dose should not be doubled to make up for a missed dose.  Important Safety Information A possible side effect of Eliquis is bleeding. You should call your healthcare provider right away if you experience any of the following: ? Bleeding from an injury or your nose that does not stop. ? Unusual colored urine (red or dark brown) or unusual colored stools (red or black). ? Unusual bruising for unknown reasons. ? A serious fall or if you hit your head (even if there is no bleeding).  Some medicines may interact with Eliquis and might increase your risk of bleeding or clotting while on Eliquis. To help avoid this, consult your healthcare provider or pharmacist prior to using any new prescription or non-prescription medications, including herbals, vitamins, non-steroidal anti-inflammatory drugs (NSAIDs) and supplements.  This website has more information on Eliquis (apixaban): http://www.eliquis.com/eliquis/home

## 2019-12-28 NOTE — Discharge Summary (Signed)
Physician Discharge Summary  Kenneth Davila [id number] DOB: [birth year] DOA: 12/21/2019  PCP: Patient, No Pcp Per  Admit date: 12/21/2019 Discharge date: 12/28/2019  Admitted From: Home. Disposition: Home  Recommendations for Outpatient Follow-up:  1. Follow up with PCP in 1-2 weeks 2. Please obtain BMP/CBC in one week    Discharge Condition: Stable CODE STATUS: Full code Diet recommendation: Low-salt diet  Discharge summary: 64 year old gentleman with history of morbid obesity, diabetes and hypertension presented to the emergency room with several days of progressive shortness of breath and cough.  He was initially found hypoxic, needing 6 L of oxygen, chest x-ray shows infiltrates.  He subsequently required more oxygen.  Being admitted and treated as pneumonia due to COVID-19 virus. Also found to have acute DVT on the right leg.  Patient was admitted to hospital and treated for pneumonia due to COVID-19 virus with acute hypoxemic respiratory failure.  He was treated with oxygen, chest physiotherapy, dexamethasone, remdesivir for 5 days, or dose of Actemra.  He was given 5 days of azithromycin therapy.  CTA of the chest was negative.  Lower extremity ultrasound showed acute DVT on the right popliteal vein.  Today patient did very good clinical recovery.  Ultimately he is able to wean off the oxygen, ambulated in the hallway with saturations more than 92%.  Most of the symptoms have improved.  Since he has done good clinical recovery, he is going home today.  Patient is prescribed Eliquis that he will take uninterrupted for at least 3 months.  He has finished most of his therapies and inflammatory markers has normalized.  Taken 8 days of steroids, discontinued today.  Discharge Diagnoses:  Principal Problem:   Acute respiratory failure due to COVID-19 [facility]) Active Problems:   GERD (gastroesophageal reflux disease)   Chronic pain syndrome   History of depression   Tobacco dependence   Diabetes mellitus type 2, diet-controlled ([place])   Morbid obesity ([place])   DVT (deep venous thrombosis) [facility])    Discharge Instructions  Discharge Instructions    Call MD for:  difficulty breathing, headache or visual disturbances   Complete by: As directed    Call MD for:  temperature >100.4   Complete by: As directed    Diet - low sodium heart healthy   Complete by: As directed    Discharge instructions   Complete by: As directed    You can take over-the-counter cough medications. You are starting on blood thinners, do not stop until seen by physician in follow-up.   Increase activity slowly   Complete by: As directed      Allergies as of 12/28/2019      Reactions   Amlodipine Nausea Only, Other (See Comments)   "Made my blood pressure go up"      Medication List    STOP taking these medications   meloxicam 15 MG tablet Commonly known as: MOBIC     TAKE these medications   apixaban 5 MG Tabs tablet Commonly known as: Eliquis Take 2 tablets (10mg ) twice daily for 7 days, then 1 tablet (5mg ) twice daily   diclofenac sodium 1 % Gel Commonly known as: Voltaren Apply 2 g topically 4 (four) times daily. What changed:   when to take this  reasons to take this  additional instructions   gabapentin 600 MG tablet Commonly known as: NEURONTIN Take 600 mg by mouth 2 (two) times daily as needed. Nerve pain       Allergies  Allergen Reactions  .  Amlodipine Nausea Only and Other (See Comments)    "Made my blood pressure go up"    Consultations:  None.   Procedures/Studies: CT ANGIO CHEST PE W OR WO CONTRAST  Result Date: 12/22/2019 CLINICAL DATA:  Respiratory failure with history of COVID-19 positivity EXAM: CT ANGIOGRAPHY CHEST WITH CONTRAST TECHNIQUE: Multidetector CT imaging of the chest was performed using the standard protocol during bolus administration of intravenous contrast. Multiplanar CT image reconstructions and MIPs were obtained to evaluate the  vascular anatomy. CONTRAST:  133mL OMNIPAQUE IOHEXOL 350 MG/ML SOLN COMPARISON:  Chest x-ray from the previous day. FINDINGS: Cardiovascular: Thoracic aorta demonstrates no aneurysmal dilatation or atherosclerotic calcifications. Mild cardiac enlargement is seen. The pulmonary artery is well visualized with a normal branching pattern. No filling defect to suggest pulmonary embolism is noted. No significant coronary calcifications are seen. Mediastinum/Nodes: Thoracic inlet is within normal limits. Scattered hilar and mediastinal lymph nodes are noted likely of a reactive nature. The esophagus as visualized is within normal limits. Lungs/Pleura: Lungs demonstrate diffuse ground-glass opacities throughout both lungs right slightly greater than left. No sizable effusion or pneumothorax is noted. No parenchymal nodules are seen. Upper Abdomen: No acute abnormality. Musculoskeletal: Degenerative changes of the thoracic spine are noted. No acute rib abnormality is seen. Review of the MIP images confirms the above findings. IMPRESSION: Diffuse ground-glass airspace opacities bilaterally right greater than left consistent with the given clinical history of COVID-19 positivity. Hilar and mediastinal adenopathy is seen likely of a reactive nature. No evidence of pulmonary emboli. No other focal abnormality is noted. Electronically Signed   By: Inez Catalina M.D.   On: 12/22/2019 15:10   DG CHEST PORT 1 VIEW  Result Date: 12/24/2019 CLINICAL DATA:  Evaluate pneumonia EXAM: PORTABLE CHEST 1 VIEW COMPARISON:  12/21/2019 FINDINGS: Bilateral interstitial and airspace opacities are improved from previous exam. Decrease airspace opacity in the right upper lobe and left base. Osseous structures are unremarkable. IMPRESSION: 1. Improving aeration to both lungs. 2. No new findings. 3. Findings consistent with resolving pneumonia. Recommend continued followup to ensure complete resolution. Electronically Signed   By: Kerby Moors  M.D.   On: 12/24/2019 09:28   DG Chest Portable 1 View  Result Date: 12/21/2019 CLINICAL DATA:  Hypoxia, vomiting EXAM: PORTABLE CHEST 1 VIEW COMPARISON:  08/13/2018 FINDINGS: Bilateral asymmetric mixed interstitial and airspace opacities, worse in the right mid lung compatible with asymmetric edema or pneumonia. Cardiac silhouette is obscured. No large effusion. Negative for pneumothorax. Trachea midline. Degenerative changes of the spine. IMPRESSION: Bilateral asymmetric interstitial and airspace opacities compatible with asymmetric edema versus pneumonia. Electronically Signed   By: Jerilynn Mages.  Shick M.D.   On: 12/21/2019 14:57   VAS [place] LOWER EXTREMITY VENOUS (DVT)  Result Date: 12/27/2019  Lower Venous Study Indications: COVID positive, elevated d-dimer.  Comparison Study: No prior study. Performing Technologist: Maudry Mayhew MHA, RDMS, RVT, RDCS  Examination Guidelines: A complete evaluation includes B-mode imaging, spectral Doppler, color Doppler, and power Doppler as needed of all accessible portions of each vessel. Bilateral testing is considered an integral part of a complete examination. Limited examinations for reoccurring indications may be performed as noted.  +---------+---------------+---------+-----------+----------+--------------+ RIGHT    CompressibilityPhasicitySpontaneityPropertiesThrombus Aging +---------+---------------+---------+-----------+----------+--------------+ CFV      Full           Yes      Yes                                 +---------+---------------+---------+-----------+----------+--------------+  SFJ      Full                                                        +---------+---------------+---------+-----------+----------+--------------+ FV Prox  Full                                                        +---------+---------------+---------+-----------+----------+--------------+ FV Mid   Full                                                         +---------+---------------+---------+-----------+----------+--------------+ FV DistalFull                                                        +---------+---------------+---------+-----------+----------+--------------+ PFV      Full                                                        +---------+---------------+---------+-----------+----------+--------------+ POP      Full           Yes      Yes                                 +---------+---------------+---------+-----------+----------+--------------+ PTV      Full                    Yes                                 +---------+---------------+---------+-----------+----------+--------------+ PERO     None                    No                   Acute          +---------+---------------+---------+-----------+----------+--------------+  +---------+---------------+---------+-----------+----------+--------------+ LEFT     CompressibilityPhasicitySpontaneityPropertiesThrombus Aging +---------+---------------+---------+-----------+----------+--------------+ CFV      Full           Yes      Yes                                 +---------+---------------+---------+-----------+----------+--------------+ SFJ      Full                                                        +---------+---------------+---------+-----------+----------+--------------+  FV Prox  Full                                                        +---------+---------------+---------+-----------+----------+--------------+ FV Mid   Full                                                        +---------+---------------+---------+-----------+----------+--------------+ FV DistalFull                                                        +---------+---------------+---------+-----------+----------+--------------+ PFV      Full                                                         +---------+---------------+---------+-----------+----------+--------------+ POP      Full           Yes      Yes                                 +---------+---------------+---------+-----------+----------+--------------+ PTV      Full                                                        +---------+---------------+---------+-----------+----------+--------------+ PERO     Full                                                        +---------+---------------+---------+-----------+----------+--------------+   Summary: Right: Findings consistent with acute deep vein thrombosis involving the right peroneal veins. No cystic structure found in the popliteal fossa. Left: There is no evidence of deep vein thrombosis in the lower extremity. No cystic structure found in the popliteal fossa.  *See table(s) above for measurements and observations. Electronically signed by Harold Barban MD on 12/27/2019 at 10:48:07 AM.    Final     (Echo, Carotid, EGD, Colonoscopy, ERCP)    Subjective: Patient seen and examined.  Up about and walking in the hallway.  Had an episode of chest discomfort yesterday with negative EKG and negative subsequent troponins.  He does have some abdominal hernias and sometimes gets crampy pain that is not new for him.  Eager to go home.   Discharge Exam: Vitals:   12/28/19 0400 12/28/19 0700  BP: 117/66 110/66  Pulse: 87 66  Resp: 20 18  Temp: 98.6 F (37 C) 98.8 F (37.1 C)  SpO2: 90% (!) 89%   Vitals:   12/27/19  1532 12/27/19 1939 12/28/19 0400 12/28/19 0700  BP: 130/77 102/65 117/66 110/66  Pulse: 86 89 87 66  Resp: 14 18 20 18   Temp: 99.3 F (37.4 C) 98.8 F (37.1 C) 98.6 F (37 C) 98.8 F (37.1 C)  TempSrc: Oral Oral Oral Oral  SpO2: 94% 90% 90% (!) 89%  Weight:      Height:        General: Pt is alert, awake, not in acute distress Cardiovascular: RRR, S1/S2 +, no rubs, no gallops Respiratory: CTA bilaterally, no wheezing, no rhonchi Abdominal:  Soft, NT, ND, bowel sounds +, obese and pendulous. Extremities: no edema, no cyanosis    The results of significant diagnostics from this hospitalization (including imaging, microbiology, ancillary and laboratory) are listed below for reference.     Microbiology: Recent Results (from the past 240 hour(s))  Respiratory Panel by RT PCR (Flu A&B, Covid) - Nasopharyngeal Swab     Status: Abnormal   Collection Time: 12/21/19  6:00 PM   Specimen: Nasopharyngeal Swab  Result Value Ref Range Status   SARS Coronavirus 2 by RT PCR POSITIVE (A) NEGATIVE Final    Comment: RESULT CALLED TO, READ BACK BY AND VERIFIED WITH: P PULLIAM RN 12/21/19 1906 JDW (NOTE) SARS-CoV-2 target nucleic acids are DETECTED. SARS-CoV-2 RNA is generally detectable in upper respiratory specimens  during the acute phase of infection. Positive results are indicative of the presence of the identified virus, but do not rule out bacterial infection or co-infection with other pathogens not detected by the test. Clinical correlation with patient history and other diagnostic information is necessary to determine patient infection status. The expected result is Negative. Fact Sheet for Patients:  PinkCheek.be Fact Sheet for Healthcare Providers: GravelBags.it This test is not yet approved or cleared by the [place] FDA and  has been authorized for detection and/or diagnosis of SARS-CoV-2 by FDA under an Emergency Use Authorization (EUA).  This EUA will remain in effect (meaning this test can be used) for the  duration of  the COVID-19 declaration under Section 564(b)(1) of the Act, 21 U.S.C. section 360bbb-3(b)(1), unless the authorization is terminated or revoked sooner.    Influenza A by PCR NEGATIVE NEGATIVE Final   Influenza B by PCR NEGATIVE NEGATIVE Final    Comment: (NOTE) The Xpert Xpress SARS-CoV-2/FLU/RSV assay is intended as an aid in  the diagnosis  of influenza from Nasopharyngeal swab specimens and  should not be used as a sole basis for treatment. Nasal washings and  aspirates are unacceptable for Xpert Xpress SARS-CoV-2/FLU/RSV  testing. Fact Sheet for Patients: PinkCheek.be Fact Sheet for Healthcare Providers: GravelBags.it This test is not yet approved or cleared by the [place] FDA and  has been authorized for detection and/or diagnosis of SARS-CoV-2 by  FDA under an Emergency Use Authorization (EUA). This EUA will remain  in effect (meaning this test can be used) for the duration of the  Covid-19 declaration under Section 564(b)(1) of the Act, 21  U.S.C. section 360bbb-3(b)(1), unless the authorization is  terminated or revoked. Performed at [facility], [place] [address] [place], [place] [address]   Blood Culture (routine x 2)     Status: None   Collection Time: 12/21/19  8:14 PM   Specimen: BLOOD  Result Value Ref Range Status   Specimen Description BLOOD LEFT ANTECUBITAL  Final   Special Requests   Final    BOTTLES DRAWN AEROBIC AND ANAEROBIC Blood Culture adequate volume   Culture  Final    NO GROWTH 5 DAYS Performed at [facility], [place] [address] [place], [place] [address]    Report Status 12/26/2019 FINAL  Final  Blood Culture (routine x 2)     Status: None   Collection Time: 12/21/19  8:24 PM   Specimen: BLOOD RIGHT HAND  Result Value Ref Range Status   Specimen Description BLOOD RIGHT HAND  Final   Special Requests   Final    BOTTLES DRAWN AEROBIC AND ANAEROBIC Blood Culture adequate volume   Culture   Final    NO GROWTH 5 DAYS Performed at [facility], [place] [address] [place], [place] [address]    Report Status 12/26/2019 FINAL  Final     Labs: BNP (last 3 results) Recent Labs    12/21/19 1543  BNP [id number]   Basic Metabolic Panel: Recent Labs  Lab 12/22/19 0705 12/23/19 0706 12/24/19 0143 12/25/19 0044  12/26/19 0252  NA 138 141 139 139 140  K 3.8 4.0 4.0 4.2 4.7  CL 94* 97* 95* 95* 96*  CO2 29 33* 33* 32 34*  GLUCOSE 160* 163* 141* 123* 125*  BUN 13 21 26* 23 21  CREATININE 1.07 1.00 1.19 1.07 1.07  CALCIUM 8.7* 8.8* 9.0 8.8* 9.0   Liver Function Tests: Recent Labs  Lab 12/22/19 0705 12/23/19 0706 12/24/19 0143 12/25/19 0044 12/26/19 0252  AST 51* 59* 61* 41 31  ALT 37 56* 82* 81* 76*  ALKPHOS 75 77 71 69 63  BILITOT 0.9 0.5 0.6 0.6 0.5  PROT 7.7 7.4 6.9 6.8 6.7  ALBUMIN 3.4* 3.1* 3.0* 3.1* 3.1*   No results for input(s): LIPASE, AMYLASE in the last 168 hours. No results for input(s): AMMONIA in the last 168 hours. CBC: Recent Labs  Lab 12/22/19 0705 12/23/19 0706 12/24/19 0143 12/25/19 0044 12/26/19 0252  WBC 8.9 11.0* 10.3 9.0 8.4  NEUTROABS 7.6 9.7* 9.0* 7.5 6.7  HGB 14.2 13.6 13.2 13.4 13.6  HCT 44.6 41.8 39.1 40.5 38.4*  MCV 84.3 88.4 87.9 89.4 89.5  PLT 170 166 188 203 218   Cardiac Enzymes: No results for input(s): CKTOTAL, CKMB, CKMBINDEX, TROPONINI in the last 168 hours. BNP: Invalid input(s): POCBNP CBG: Recent Labs  Lab 12/26/19 1956 12/27/19 0735 12/27/19 1628 12/27/19 1937 12/28/19 0745  GLUCAP 138* 129* 139* 171* 89   D-Dimer Recent Labs    12/26/19 0252  DDIMER 2.41*   Hgb A1c No results for input(s): HGBA1C in the last 72 hours. Lipid Profile No results for input(s): CHOL, HDL, LDLCALC, TRIG, CHOLHDL, LDLDIRECT in the last 72 hours. Thyroid function studies No results for input(s): TSH, T4TOTAL, T3FREE, THYROIDAB in the last 72 hours.  Invalid input(s): FREET3 Anemia work up No results for input(s): VITAMINB12, FOLATE, FERRITIN, TIBC, IRON, RETICCTPCT in the last 72 hours. Urinalysis    Component Value Date/Time   COLORURINE YELLOW 02/15/2018 1435   APPEARANCEUR CLEAR 02/15/2018 1435   LABSPEC 1.014 02/15/2018 1435   PHURINE 6.0 02/15/2018 1435   GLUCOSEU NEGATIVE 02/15/2018 1435   HGBUR NEGATIVE 02/15/2018 1435    BILIRUBINUR neg 01/14/2019 1435   KETONESUR NEGATIVE 02/15/2018 1435   PROTEINUR Positive (A) 01/14/2019 1435   PROTEINUR NEGATIVE 02/15/2018 1435   UROBILINOGEN 0.2 01/14/2019 1435   UROBILINOGEN 0.2 01/02/2018 1402   NITRITE neg 01/14/2019 1435   NITRITE NEGATIVE 02/15/2018 1435   LEUKOCYTESUR Negative 01/14/2019 1435   Sepsis Labs Invalid input(s): PROCALCITONIN,  WBC,  LACTICIDVEN Microbiology Recent Results (from  the past 240 hour(s))  Respiratory Panel by RT PCR (Flu A&B, Covid) - Nasopharyngeal Swab     Status: Abnormal   Collection Time: 12/21/19  6:00 PM   Specimen: Nasopharyngeal Swab  Result Value Ref Range Status   SARS Coronavirus 2 by RT PCR POSITIVE (A) NEGATIVE Final    Comment: RESULT CALLED TO, READ BACK BY AND VERIFIED WITH: P PULLIAM RN 12/21/19 1906 JDW (NOTE) SARS-CoV-2 target nucleic acids are DETECTED. SARS-CoV-2 RNA is generally detectable in upper respiratory specimens  during the acute phase of infection. Positive results are indicative of the presence of the identified virus, but do not rule out bacterial infection or co-infection with other pathogens not detected by the test. Clinical correlation with patient history and other diagnostic information is necessary to determine patient infection status. The expected result is Negative. Fact Sheet for Patients:  PinkCheek.be Fact Sheet for Healthcare Providers: GravelBags.it This test is not yet approved or cleared by the [place] FDA and  has been authorized for detection and/or diagnosis of SARS-CoV-2 by FDA under an Emergency Use Authorization (EUA).  This EUA will remain in effect (meaning this test can be used) for the  duration of  the COVID-19 declaration under Section 564(b)(1) of the Act, 21 U.S.C. section 360bbb-3(b)(1), unless the authorization is terminated or revoked sooner.    Influenza A by PCR NEGATIVE NEGATIVE Final    Influenza B by PCR NEGATIVE NEGATIVE Final    Comment: (NOTE) The Xpert Xpress SARS-CoV-2/FLU/RSV assay is intended as an aid in  the diagnosis of influenza from Nasopharyngeal swab specimens and  should not be used as a sole basis for treatment. Nasal washings and  aspirates are unacceptable for Xpert Xpress SARS-CoV-2/FLU/RSV  testing. Fact Sheet for Patients: PinkCheek.be Fact Sheet for Healthcare Providers: GravelBags.it This test is not yet approved or cleared by the [place] FDA and  has been authorized for detection and/or diagnosis of SARS-CoV-2 by  FDA under an Emergency Use Authorization (EUA). This EUA will remain  in effect (meaning this test can be used) for the duration of the  Covid-19 declaration under Section 564(b)(1) of the Act, 21  U.S.C. section 360bbb-3(b)(1), unless the authorization is  terminated or revoked. Performed at [facility], [place] [address] [place], [place] [address]   Blood Culture (routine x 2)     Status: None   Collection Time: 12/21/19  8:14 PM   Specimen: BLOOD  Result Value Ref Range Status   Specimen Description BLOOD LEFT ANTECUBITAL  Final   Special Requests   Final    BOTTLES DRAWN AEROBIC AND ANAEROBIC Blood Culture adequate volume   Culture   Final    NO GROWTH 5 DAYS Performed at [facility], [place] [address] [place], [place] [address]    Report Status 12/26/2019 FINAL  Final  Blood Culture (routine x 2)     Status: None   Collection Time: 12/21/19  8:24 PM   Specimen: BLOOD RIGHT HAND  Result Value Ref Range Status   Specimen Description BLOOD RIGHT HAND  Final   Special Requests   Final    BOTTLES DRAWN AEROBIC AND ANAEROBIC Blood Culture adequate volume   Culture   Final    NO GROWTH 5 DAYS Performed at [facility], [place] [address], [place], [place] [address]    Report Status 12/26/2019 FINAL  Final     Time coordinating discharge: 45  minutes  SIGNED:   Barb Merino,  MD  [facility] 12/28/2019, 11:14 AM

## 2020-01-13 ENCOUNTER — Other Ambulatory Visit: Payer: Self-pay

## 2020-06-02 ENCOUNTER — Other Ambulatory Visit: Payer: Self-pay

## 2020-06-02 ENCOUNTER — Ambulatory Visit (HOSPITAL_COMMUNITY)
Admission: RE | Admit: 2020-06-02 | Discharge: 2020-06-02 | Disposition: A | Discharge status: Home / Self Care | Payer: Medicaid Other | Source: Ambulatory Visit | Attending: Vascular Surgery | Admitting: Vascular Surgery

## 2020-06-02 ENCOUNTER — Other Ambulatory Visit (HOSPITAL_COMMUNITY): Payer: Self-pay | Admitting: Internal Medicine

## 2020-06-02 DIAGNOSIS — I824Z1 Acute embolism and thrombosis of unspecified deep veins of right distal lower extremity: Secondary | ICD-10-CM | POA: Insufficient documentation

## 2020-06-18 ENCOUNTER — Encounter (HOSPITAL_COMMUNITY): Payer: Self-pay

## 2020-06-18 ENCOUNTER — Other Ambulatory Visit: Payer: Self-pay

## 2020-06-18 ENCOUNTER — Ambulatory Visit (HOSPITAL_COMMUNITY)
Admission: EM | Admit: 2020-06-18 | Discharge: 2020-06-18 | Disposition: A | Discharge status: Home / Self Care | Payer: Medicaid Other | Attending: Family Medicine | Admitting: Family Medicine

## 2020-06-18 DIAGNOSIS — R21 Rash and other nonspecific skin eruption: Secondary | ICD-10-CM

## 2020-06-18 HISTORY — DX: Obesity, unspecified: E66.9

## 2020-06-18 HISTORY — DX: COVID-19: U07.1

## 2020-06-18 MED ORDER — PREDNISONE 10 MG (48) PO TBPK: ORAL_TABLET | ORAL | 0 refills | Status: DC

## 2020-06-18 NOTE — ED Triage Notes (Signed)
Pt c/o rash on entire upper bodyx97mo. Pt states it started when he started losartan. Pt has raised bumps on arms.
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
## 2020-07-22 ENCOUNTER — Ambulatory Visit
Admission: EM | Admit: 2020-07-22 | Discharge: 2020-07-22 | Disposition: A | Discharge status: Home / Self Care | Payer: Medicaid Other | Attending: Physician Assistant | Admitting: Physician Assistant

## 2020-07-22 DIAGNOSIS — Z5189 Encounter for other specified aftercare: Secondary | ICD-10-CM | POA: Diagnosis not present

## 2020-07-22 MED ORDER — MUPIROCIN 2 % EX OINT: 1.0000 "application " | TOPICAL_OINTMENT | Freq: Two times a day (BID) | CUTANEOUS | 0 refills | Status: DC

## 2020-07-22 MED ORDER — CEPHALEXIN 500 MG PO CAPS: 500.0000 mg | ORAL_CAPSULE | Freq: Four times a day (QID) | ORAL | 0 refills | Status: DC
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
## 2020-07-22 NOTE — ED Triage Notes (Signed)
Pt c/o skin lesions/wounds to bilateral arms, beltline, and under left arm for approx 8 weeks. Pt states he was evaluated for same on 07/04 with some improvement. Multiple wounds circular in nature noted, right arm with lesion approx 4cm diameter and 31mm depth with yellow border. Pt states wounds are extremely painful

## 2020-07-22 NOTE — Discharge Instructions (Signed)
Start keflex as directed. Keep area clean and dry. Clean gently with soap and water. Dress with bactroban to prevent further irritation/friction when outdoors. But when at home, make sure to allow area to breath. Follow up with wound care for further evaluation and management needed.

## 2020-08-10 ENCOUNTER — Ambulatory Visit (INDEPENDENT_AMBULATORY_CARE_PROVIDER_SITE_OTHER): Payer: Medicaid Other | Admitting: Internal Medicine

## 2020-08-10 ENCOUNTER — Other Ambulatory Visit: Payer: Self-pay

## 2020-08-10 ENCOUNTER — Encounter: Payer: Self-pay | Admitting: Internal Medicine

## 2020-08-10 ENCOUNTER — Other Ambulatory Visit (HOSPITAL_COMMUNITY)
Admission: RE | Admit: 2020-08-10 | Discharge: 2020-08-10 | Disposition: A | Discharge status: Home / Self Care | Payer: Medicaid Other | Source: Ambulatory Visit | Attending: Internal Medicine | Admitting: Internal Medicine

## 2020-08-10 VITALS — BP 145/81 | HR 83 | Temp 99.0°F | Ht 74.0 in | Wt 357.6 lb

## 2020-08-10 DIAGNOSIS — L98499 Non-pressure chronic ulcer of skin of other sites with unspecified severity: Secondary | ICD-10-CM | POA: Diagnosis present

## 2020-08-10 MED ORDER — LIDOCAINE 5 % EX OINT: 1.0000 "application " | TOPICAL_OINTMENT | CUTANEOUS | 0 refills | Status: DC | PRN
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
## 2020-08-15 LAB — AEROBIC/ANAEROBIC CULTURE W GRAM STAIN (SURGICAL/DEEP WOUND)
Culture: NO GROWTH
Gram Stain: NONE SEEN

## 2020-08-16 LAB — SURGICAL PATHOLOGY

## 2020-08-24 ENCOUNTER — Encounter: Payer: Self-pay | Admitting: Internal Medicine

## 2020-08-24 ENCOUNTER — Other Ambulatory Visit: Payer: Self-pay

## 2020-08-24 ENCOUNTER — Ambulatory Visit (INDEPENDENT_AMBULATORY_CARE_PROVIDER_SITE_OTHER): Payer: Medicaid Other | Admitting: Internal Medicine

## 2020-08-24 VITALS — BP 136/82 | HR 91 | Temp 98.7°F

## 2020-08-24 DIAGNOSIS — L98499 Non-pressure chronic ulcer of skin of other sites with unspecified severity: Secondary | ICD-10-CM
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
## 2020-10-18 ENCOUNTER — Other Ambulatory Visit (HOSPITAL_BASED_OUTPATIENT_CLINIC_OR_DEPARTMENT_OTHER): Payer: Self-pay

## 2020-10-18 DIAGNOSIS — G4719 Other hypersomnia: Secondary | ICD-10-CM

## 2020-10-18 DIAGNOSIS — R0683 Snoring: Secondary | ICD-10-CM

## 2020-10-18 DIAGNOSIS — R5383 Other fatigue: Secondary | ICD-10-CM

## 2020-11-26 ENCOUNTER — Ambulatory Visit (HOSPITAL_BASED_OUTPATIENT_CLINIC_OR_DEPARTMENT_OTHER): Payer: Medicaid Other | Attending: Internal Medicine | Admitting: Internal Medicine

## 2020-11-26 ENCOUNTER — Other Ambulatory Visit: Payer: Self-pay

## 2020-11-26 VITALS — Ht 74.0 in | Wt 362.0 lb

## 2020-11-26 DIAGNOSIS — G4733 Obstructive sleep apnea (adult) (pediatric): Secondary | ICD-10-CM

## 2020-11-26 DIAGNOSIS — G4719 Other hypersomnia: Secondary | ICD-10-CM

## 2020-11-26 DIAGNOSIS — R0683 Snoring: Secondary | ICD-10-CM | POA: Diagnosis not present

## 2020-11-26 DIAGNOSIS — R5383 Other fatigue: Secondary | ICD-10-CM | POA: Insufficient documentation

## 2020-11-26 DIAGNOSIS — G4710 Hypersomnia, unspecified: Secondary | ICD-10-CM | POA: Insufficient documentation

## 2020-12-17 NOTE — Procedures (Signed)
    Patient Name: Kenneth Davila, Kenneth Davila Date: 11/26/2020 Gender: Male D.O.B: 1956/07/03 Age (years): 84 Referring Provider: Fleet Contras Height (inches): 74 Interpreting Physician: Jetty Duhamel MD, ABSM Weight (lbs): 362 RPSGT: Rolene Arbour BMI: 46 MRN: [id number] Neck Size: 21.00  CLINICAL INFORMATION The patient is referred for a Split NPSG to PAP study for dx OSA Date of NPSG, Split Night or HST:  SLEEP STUDY TECHNIQUE As per the AASM Manual for the Scoring of Sleep and Associated Events v2.3 (April 2016) with a hypopnea requiring 4% desaturations.  The channels recorded and monitored were frontal, central and occipital EEG, electrooculogram (EOG), submentalis EMG (chin), nasal and oral airflow, thoracic and abdominal wall motion, anterior tibialis EMG, snore microphone, electrocardiogram, and pulse oximetry. Bilevel positive airway pressure (BPAP) was initiated at the beginning of the study and titrated to treat sleep-disordered breathing.  MEDICATIONS Medications self-administered by patient taken the night of the study : none reported   Overall Minimal O2 (%): 51.00 Minimal O2 at Optimal Pressure (%): 0.00 SLEEP ARCHITECTURE Start Time: 9:28:09 PM Stop Time: 4:19:15 AM Total Time (min): 411.1 Total Sleep Time (min): 340.3 Sleep Latency (min): 3.8 Sleep Efficiency (%): 82.8 REM Latency (min): 83.0 WASO (min): 67.0 Stage N1 (%): 12.05 Stage N2 (%): 56.43 Stage N3 (%): 7.94 Stage R (%): 23.6 Supine (%): 100.00 Arousal Index (/hr): 59.6   CARDIAC DATA The 2 lead EKG demonstrated sinus rhythm. The mean heart rate was 82.87 beats per minute. Other EKG findings include: None.  LEG MOVEMENT DATA The total Periodic Limb Movements of Sleep (PLMS) were 0. The PLMS index was 0.00. A PLMS index of <15 is considered normal in adults.  IMPRESSIONS - Severre obstructive sleep apnea/ hypopnea sybndrome, AHI 121/ hr. - CPAP provided inadequate cocntrol. Titration was changed to  BIPAP with incomplete control achieved at BIPA 20/16 in available time. - Central sleep apnea was not noted during this titration (CAI = 0.5/h). - Severe oxygen desaturations were observed during this titration (min O2 = 51.00%). Supplemental O2 was required on BIPAP at 2L/ min per protocol. - The patient snored with very loud snoring volume. - No cardiac abnormalities were observed during this study. - Clinically significant periodic limb movements were not noted during this study.  DIAGNOSIS - Obstructive Sleep Apnea (G47.33) - Nocturnal Hypoxemia (G47.36)  RECOMMENDATIONS - Recommend a trial of Auto-BiPAP 6 - 25 cm H2O, or return for BIPAP titration sleep study. - Add O2 2L for sleep- dx Nocturnal Hypoxemia not corrected by BIPAP. - Be careful with alcohol, sedatives and other CNS depressants that may worsen sleep apnea and disrupt normal sleep architecture. - Sleep hygiene should be reviewed to assess factors that may improve sleep quality. - Weight management and regular exercise should be initiated or continued.  [Electronically signed] 12/18/2020 11:05 AM  Jetty Duhamel MD, ABSM Diplomate, American Board of Sleep Medicine   NPI: [id number]                        Jetty Duhamel Diplomate, American Board of Sleep Medicine  ELECTRONICALLY SIGNED ON:  12/17/2020, 12:14 PM [facility] PH: [phone]   FX: [phone] ACCREDITED BY THE AMERICAN ACADEMY OF SLEEP MEDICINE

## 2020-12-18 DIAGNOSIS — R0683 Snoring: Secondary | ICD-10-CM | POA: Diagnosis not present

## 2021-01-10 ENCOUNTER — Other Ambulatory Visit: Payer: Self-pay | Admitting: Internal Medicine

## 2021-01-11 LAB — COMPLETE METABOLIC PANEL WITH GFR
AG Ratio: 1.7 (calc) (ref 1.0–2.5)
ALT: 28 U/L (ref 9–46)
AST: 27 U/L (ref 10–35)
Albumin: 4.3 g/dL (ref 3.6–5.1)
Alkaline phosphatase (APISO): 93 U/L (ref 35–144)
BUN: 12 mg/dL (ref 7–25)
CO2: 27 mmol/L (ref 20–32)
Calcium: 9.2 mg/dL (ref 8.6–10.3)
Chloride: 103 mmol/L (ref 98–110)
Creat: 1.14 mg/dL (ref 0.70–1.25)
GFR, Est African American: 79 mL/min/{1.73_m2} (ref >=60)
GFR, Est Non African American: 69 mL/min/{1.73_m2} (ref >=60)
Globulin: 2.6 g/dL (calc) (ref 1.9–3.7)
Glucose, Bld: 100 mg/dL — ABNORMAL HIGH (ref 65–99)
Potassium: 4.3 mmol/L (ref 3.5–5.3)
Sodium: 141 mmol/L (ref 135–146)
Total Bilirubin: 0.4 mg/dL (ref 0.2–1.2)
Total Protein: 6.9 g/dL (ref 6.1–8.1)

## 2021-01-11 LAB — VITAMIN D 25 HYDROXY (VIT D DEFICIENCY, FRACTURES): Vit D, 25-Hydroxy: 29 ng/mL — ABNORMAL LOW (ref 30–100)

## 2021-01-11 LAB — CBC
HCT: 43.1 % (ref 38.5–50.0)
Hemoglobin: 14.2 g/dL (ref 13.2–17.1)
MCH: 26.1 pg — ABNORMAL LOW (ref 27.0–33.0)
MCHC: 32.9 g/dL (ref 32.0–36.0)
MCV: 79.1 fL — ABNORMAL LOW (ref 80.0–100.0)
MPV: 11.8 fL (ref 7.5–12.5)
Platelets: 177 10*3/uL (ref 140–400)
RBC: 5.45 10*6/uL (ref 4.20–5.80)
RDW: 13.7 % (ref 11.0–15.0)
WBC: 7.1 10*3/uL (ref 3.8–10.8)

## 2021-01-11 LAB — TSH: TSH: 0.38 mIU/L — ABNORMAL LOW (ref 0.40–4.50)

## 2021-01-11 LAB — URINE CULTURE
MICRO NUMBER:: 11455055
SPECIMEN QUALITY:: ADEQUATE

## 2021-01-11 LAB — PSA: PSA: 2.49 ng/mL (ref <=4.0)

## 2021-01-24 ENCOUNTER — Encounter (HOSPITAL_COMMUNITY): Payer: Self-pay | Admitting: Emergency Medicine

## 2021-01-24 ENCOUNTER — Emergency Department (HOSPITAL_COMMUNITY)
Admission: EM | Admit: 2021-01-24 | Discharge: 2021-01-24 | Disposition: A | Discharge status: Home / Self Care | Payer: Medicaid Other | Attending: Emergency Medicine | Admitting: Emergency Medicine

## 2021-01-24 ENCOUNTER — Emergency Department (HOSPITAL_COMMUNITY): Payer: Medicaid Other

## 2021-01-24 DIAGNOSIS — J45909 Unspecified asthma, uncomplicated: Secondary | ICD-10-CM | POA: Insufficient documentation

## 2021-01-24 DIAGNOSIS — E119 Type 2 diabetes mellitus without complications: Secondary | ICD-10-CM | POA: Insufficient documentation

## 2021-01-24 DIAGNOSIS — I10 Essential (primary) hypertension: Secondary | ICD-10-CM | POA: Insufficient documentation

## 2021-01-24 DIAGNOSIS — Z79899 Other long term (current) drug therapy: Secondary | ICD-10-CM | POA: Insufficient documentation

## 2021-01-24 DIAGNOSIS — Z87891 Personal history of nicotine dependence: Secondary | ICD-10-CM | POA: Insufficient documentation

## 2021-01-24 DIAGNOSIS — M545 Low back pain, unspecified: Secondary | ICD-10-CM | POA: Diagnosis present

## 2021-01-24 MED ORDER — ACETAMINOPHEN 325 MG PO TABS
650.0000 mg | ORAL_TABLET | Freq: Once | ORAL | Status: AC
  Administered 2021-01-24: 650 mg via ORAL
  Filled 2021-01-24: qty 2

## 2021-01-24 MED ORDER — DEXAMETHASONE SODIUM PHOSPHATE 10 MG/ML IJ SOLN: 10.0000 mg | Freq: Once | INTRAMUSCULAR | Status: DC

## 2021-01-24 MED ORDER — HYDROCODONE-ACETAMINOPHEN 5-325 MG PO TABS: 1.0000 | ORAL_TABLET | Freq: Two times a day (BID) | ORAL | 0 refills | Status: DC | PRN
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
## 2021-01-24 NOTE — ED Notes (Signed)
Patient transported to MRI 

## 2021-01-24 NOTE — Discharge Instructions (Signed)
Prescription given for Norco. Take medication as directed and do not operate machinery, drive a car, or work while taking this medication as it can make you drowsy.   Please follow up with the neurosurgeon, Dr. Zada Finders within 5-7 days for re-evaluation of your symptoms.   Return to the emergency department immediately if you experience any back pain associated with fevers, loss of control of your bowels/bladder, weakness/numbness to your legs, numbness to your groin area, inability to walk, or inability to urinate.

## 2021-07-06 ENCOUNTER — Other Ambulatory Visit: Payer: Self-pay

## 2021-07-06 ENCOUNTER — Ambulatory Visit (HOSPITAL_COMMUNITY)
Admission: EM | Admit: 2021-07-06 | Discharge: 2021-07-06 | Disposition: A | Discharge status: Home / Self Care | Payer: Medicaid Other

## 2021-07-06 ENCOUNTER — Encounter (HOSPITAL_COMMUNITY): Payer: Self-pay

## 2021-07-06 DIAGNOSIS — G629 Polyneuropathy, unspecified: Secondary | ICD-10-CM

## 2021-07-06 DIAGNOSIS — Z7901 Long term (current) use of anticoagulants: Secondary | ICD-10-CM

## 2021-07-06 DIAGNOSIS — G589 Mononeuropathy, unspecified: Secondary | ICD-10-CM

## 2021-07-06 NOTE — Discharge Instructions (Addendum)
-  Call neurology today to schedule follow-up -Follow-up with PCP -Seek additional immediate medical attention if you develop new symptoms like the worst back pain of your life, new weakness in your legs, new numbness of your inner thighs, new urinary incontinence.
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
## 2021-07-06 NOTE — ED Triage Notes (Signed)
Pt in with c/o bilateral leg & feet numbness for a few months  Pt states he has a burning sensation when something touches his legs  Pt states he's been taking gabapentin and lyrica, also states he has pinched nerves

## 2021-07-13 ENCOUNTER — Other Ambulatory Visit: Payer: Self-pay | Admitting: Neurological Surgery

## 2021-08-01 ENCOUNTER — Other Ambulatory Visit: Payer: Self-pay

## 2021-08-01 ENCOUNTER — Encounter (HOSPITAL_COMMUNITY): Admission: RE | Admit: 2021-08-01 | Payer: Medicaid Other | Source: Ambulatory Visit

## 2021-08-02 ENCOUNTER — Other Ambulatory Visit: Payer: Self-pay

## 2021-08-02 ENCOUNTER — Ambulatory Visit: Payer: Medicaid Other | Attending: Neurological Surgery | Admitting: Physical Therapy

## 2021-08-02 ENCOUNTER — Encounter: Payer: Self-pay | Admitting: Physical Therapy

## 2021-08-02 DIAGNOSIS — M6281 Muscle weakness (generalized): Secondary | ICD-10-CM | POA: Insufficient documentation

## 2021-08-02 DIAGNOSIS — R2689 Other abnormalities of gait and mobility: Secondary | ICD-10-CM | POA: Diagnosis present

## 2021-08-02 DIAGNOSIS — G8929 Other chronic pain: Secondary | ICD-10-CM | POA: Diagnosis present

## 2021-08-02 DIAGNOSIS — M5442 Lumbago with sciatica, left side: Secondary | ICD-10-CM | POA: Insufficient documentation

## 2021-08-02 DIAGNOSIS — M5441 Lumbago with sciatica, right side: Secondary | ICD-10-CM | POA: Insufficient documentation

## 2021-08-02 NOTE — Patient Instructions (Signed)
Access Code: [email or web address] URL: [email or web address] Date: 08/02/2021 Prepared by: Hilda Blades  Exercises Seated Lumbar Flexion Stretch - 2 x daily - 7 x weekly - 10 reps - 5 hold Supine Lower Trunk Rotation - 2 x daily - 7 x weekly - 5 reps - 10 hold Supine Posterior Pelvic Tilt - 2 x daily - 7 x weekly - 10 reps - 5 hold
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
## 2021-08-08 ENCOUNTER — Encounter (HOSPITAL_COMMUNITY): Admission: RE | Payer: Self-pay | Source: Home / Self Care

## 2021-08-08 ENCOUNTER — Inpatient Hospital Stay (HOSPITAL_COMMUNITY): Admission: RE | Admit: 2021-08-08 | Payer: Medicaid Other | Source: Home / Self Care | Admitting: Neurological Surgery

## 2021-08-08 SURGERY — TRANSFORAMINAL LUMBAR INTERBODY FUSION (TLIF) WITH PEDICLE SCREW FIXATION 2 LEVEL
Anesthesia: General

## 2021-08-10 ENCOUNTER — Other Ambulatory Visit: Payer: Self-pay

## 2021-08-10 ENCOUNTER — Encounter: Payer: Self-pay | Admitting: Physical Therapy

## 2021-08-10 ENCOUNTER — Ambulatory Visit: Payer: Medicaid Other | Admitting: Physical Therapy

## 2021-08-10 DIAGNOSIS — R2689 Other abnormalities of gait and mobility: Secondary | ICD-10-CM

## 2021-08-10 DIAGNOSIS — M6281 Muscle weakness (generalized): Secondary | ICD-10-CM

## 2021-08-10 DIAGNOSIS — M5442 Lumbago with sciatica, left side: Secondary | ICD-10-CM | POA: Diagnosis not present

## 2021-08-10 DIAGNOSIS — G8929 Other chronic pain: Secondary | ICD-10-CM
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
## 2021-08-16 ENCOUNTER — Ambulatory Visit: Payer: Medicaid Other | Admitting: Physical Therapy

## 2021-08-16 ENCOUNTER — Other Ambulatory Visit: Payer: Self-pay

## 2021-08-16 ENCOUNTER — Encounter: Payer: Self-pay | Admitting: Physical Therapy

## 2021-08-16 DIAGNOSIS — M6281 Muscle weakness (generalized): Secondary | ICD-10-CM

## 2021-08-16 DIAGNOSIS — M5442 Lumbago with sciatica, left side: Secondary | ICD-10-CM | POA: Diagnosis not present

## 2021-08-16 DIAGNOSIS — G8929 Other chronic pain: Secondary | ICD-10-CM

## 2021-08-16 DIAGNOSIS — R2689 Other abnormalities of gait and mobility: Secondary | ICD-10-CM
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
## 2021-08-23 ENCOUNTER — Ambulatory Visit: Payer: Medicaid Other | Attending: Neurological Surgery | Admitting: Physical Therapy

## 2021-08-23 ENCOUNTER — Other Ambulatory Visit: Payer: Self-pay

## 2021-08-23 ENCOUNTER — Encounter: Payer: Self-pay | Admitting: Physical Therapy

## 2021-08-23 DIAGNOSIS — G8929 Other chronic pain: Secondary | ICD-10-CM | POA: Insufficient documentation

## 2021-08-23 DIAGNOSIS — R2689 Other abnormalities of gait and mobility: Secondary | ICD-10-CM | POA: Insufficient documentation

## 2021-08-23 DIAGNOSIS — M6281 Muscle weakness (generalized): Secondary | ICD-10-CM | POA: Diagnosis present

## 2021-08-23 DIAGNOSIS — M5441 Lumbago with sciatica, right side: Secondary | ICD-10-CM | POA: Diagnosis present

## 2021-08-23 DIAGNOSIS — M5442 Lumbago with sciatica, left side: Secondary | ICD-10-CM | POA: Diagnosis not present
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
## 2022-02-01 ENCOUNTER — Other Ambulatory Visit: Payer: Self-pay | Admitting: Neurological Surgery

## 2022-02-06 NOTE — Pre-Procedure Instructions (Signed)
Surgical Instructions    Your procedure is scheduled on Monday, March 6th.  Report to [facility] Main Entrance "A" at 5:30 A.M., then check in with the Admitting office.  Call this number if you have problems the morning of surgery:  [phone]   If you have any questions prior to your surgery date call [phone]: Open Monday-Friday 8am-4pm    Remember:  Do not eat after midnight the night before your surgery  You may drink clear liquids until 4:30 a.m. the morning of your surgery.   Clear liquids allowed are: Water, Non-Citrus Juices (without pulp), Carbonated Beverages, Clear Tea, Black Coffee ONLY (NO MILK, CREAM OR POWDERED CREAMER of any kind), and Gatorade.    Take these medicines the morning of surgery with A SIP OF WATER:  pregabalin (LYRICA)  traMADol (ULTRAM)-as needed COMBIVENT RESPIMAT-as needed  As of today, STOP taking any Aspirin (unless otherwise instructed by your surgeon) Aleve, Naproxen, Ibuprofen, Motrin, Advil, Goody's, BC's, all herbal medications, fish oil, and all vitamins.           Do not wear jewelry. Do not wear lotions, powders, colognes, or deodorant. Men may shave face and neck. Do not bring valuables to the hospital.  [facility] is not responsible for any belongings or valuables. .   Do NOT Smoke (Tobacco/Vaping)  24 hours prior to your procedure  If you use a CPAP at night, you may bring your mask for your overnight stay.   Contacts, glasses, hearing aids, dentures or partials may not be worn into surgery, please bring cases for these belongings   For patients admitted to the hospital, discharge time will be determined by your treatment team.   Patients discharged the day of surgery will not be allowed to drive home, and someone needs to stay with them for 24 hours.  NO VISITORS WILL BE ALLOWED IN PRE-OP WHERE PATIENTS ARE PREPPED FOR SURGERY.  ONLY 1 SUPPORT PERSON MAY BE PRESENT IN THE WAITING ROOM WHILE YOU ARE IN SURGERY.  IF YOU  ARE TO BE ADMITTED, ONCE YOU ARE IN YOUR ROOM YOU WILL BE ALLOWED TWO (2) VISITORS. 1 (ONE) VISITOR MAY STAY OVERNIGHT BUT MUST ARRIVE TO THE ROOM BY 8pm.  Minor children may have two parents present. Special consideration for safety and communication needs will be reviewed on a case by case basis.  Special instructions:    Oral Hygiene is also important to reduce your risk of infection.  Remember - BRUSH YOUR TEETH THE MORNING OF SURGERY WITH YOUR REGULAR TOOTHPASTE   [facility]- Preparing For Surgery  Before surgery, you can play an important role. Because skin is not sterile, your skin needs to be as free of germs as possible. You can reduce the number of germs on your skin by washing with CHG (chlorahexidine gluconate) Soap before surgery.  CHG is an antiseptic cleaner which kills germs and bonds with the skin to continue killing germs even after washing.     Please do not use if you have an allergy to CHG or antibacterial soaps. If your skin becomes reddened/irritated stop using the CHG.  Do not shave (including legs and underarms) for at least 48 hours prior to first CHG shower. It is OK to shave your face.  Please follow these instructions carefully.     Shower the NIGHT BEFORE SURGERY and the MORNING OF SURGERY with CHG Soap.   If you chose to wash your hair, wash your hair first as usual with your normal  shampoo. After you shampoo, rinse your hair and body thoroughly to remove the shampoo.  Then [employer] and genitals (private parts) with your normal soap and rinse thoroughly to remove soap.  After that Use CHG Soap as you would any other liquid soap. You can apply CHG directly to the skin and wash gently with a scrungie or a clean washcloth.   Apply the CHG Soap to your body ONLY FROM THE NECK DOWN.  Do not use on open wounds or open sores. Avoid contact with your eyes, ears, mouth and genitals (private parts). Wash Face and genitals (private parts)  with your normal soap.   Wash  thoroughly, paying special attention to the area where your surgery will be performed.  Thoroughly rinse your body with warm water from the neck down.  DO NOT shower/wash with your normal soap after using and rinsing off the CHG Soap.  Pat yourself dry with a CLEAN TOWEL.  Wear CLEAN PAJAMAS to bed the night before surgery  Place CLEAN SHEETS on your bed the night before your surgery  DO NOT SLEEP WITH PETS.   Day of Surgery:  Take a shower with CHG soap. Wear Clean/Comfortable clothing the morning of surgery Do not apply any deodorants/lotions.   Remember to brush your teeth WITH YOUR REGULAR TOOTHPASTE.    COVID testing  If you are going to stay overnight or be admitted after your procedure/surgery and require a pre-op COVID test, please follow these instructions after your COVID test   You are not required to quarantine however you are required to wear a well-fitting mask when you are out and around people not in your household.  If your mask becomes wet or soiled, replace with a new one.  Wash your hands often with soap and water for 20 seconds or clean your hands with an alcohol-based hand sanitizer that contains at least 60% alcohol.  Do not share personal items.  Notify your provider: if you are in close contact with someone who has COVID  or if you develop a fever of 100.4 or greater, sneezing, cough, sore throat, shortness of breath or body aches.    Please read over the following fact sheets that you were given.

## 2022-02-07 ENCOUNTER — Other Ambulatory Visit: Payer: Self-pay

## 2022-02-07 ENCOUNTER — Encounter (HOSPITAL_COMMUNITY): Payer: Self-pay

## 2022-02-07 ENCOUNTER — Encounter (HOSPITAL_COMMUNITY)
Admission: RE | Admit: 2022-02-07 | Discharge: 2022-02-07 | Disposition: A | Discharge status: Home / Self Care | Payer: Medicaid Other | Source: Ambulatory Visit | Attending: Neurological Surgery | Admitting: Neurological Surgery

## 2022-02-07 VITALS — BP 147/77 | HR 82 | Temp 98.5°F | Resp 18 | Ht 74.0 in | Wt 375.5 lb

## 2022-02-07 DIAGNOSIS — Z01818 Encounter for other preprocedural examination: Secondary | ICD-10-CM | POA: Insufficient documentation

## 2022-02-07 HISTORY — DX: Dyspnea, unspecified: R06.00

## 2022-02-07 HISTORY — DX: Prediabetes: R73.03

## 2022-02-07 LAB — BASIC METABOLIC PANEL
Anion gap: 8 (ref 5–15)
BUN: 10 mg/dL (ref 8–23)
CO2: 29 mmol/L (ref 22–32)
Calcium: 9 mg/dL (ref 8.9–10.3)
Chloride: 103 mmol/L (ref 98–111)
Creatinine, Ser: 1.17 mg/dL (ref 0.61–1.24)
GFR, Estimated: >60 mL/min (ref >60)
Glucose, Bld: 110 mg/dL — ABNORMAL HIGH (ref 70–99)
Potassium: 4.1 mmol/L (ref 3.5–5.1)
Sodium: 140 mmol/L (ref 135–145)

## 2022-02-07 LAB — TYPE AND SCREEN
ABO/RH(D): O POS
Antibody Screen: NEGATIVE

## 2022-02-07 LAB — CBC
HCT: 47.2 % (ref 39.0–52.0)
Hemoglobin: 14.1 g/dL (ref 13.0–17.0)
MCH: 26.4 pg (ref 26.0–34.0)
MCHC: 29.9 g/dL — ABNORMAL LOW (ref 30.0–36.0)
MCV: 88.2 fL (ref 80.0–100.0)
Platelets: 130 10*3/uL — ABNORMAL LOW (ref 150–400)
RBC: 5.35 MIL/uL (ref 4.22–5.81)
RDW: 13.4 % (ref 11.5–15.5)
WBC: 4.8 10*3/uL (ref 4.0–10.5)
nRBC: 0 % (ref 0.0–0.2)

## 2022-02-07 LAB — SURGICAL PCR SCREEN
MRSA, PCR: NEGATIVE
Staphylococcus aureus: POSITIVE — AB

## 2022-02-07 NOTE — Progress Notes (Signed)
PCP - Nolene Ebbs Cardiologist - denies  Chest x-ray - n/a EKG - 02/07/22  SA - yes, does not wear CPAP  ERAS Protcol - yes, no drink given   COVID TEST- scheduled for 02/16/22 (surgery admit)   Anesthesia review: n/a  Patient denies shortness of breath, fever, cough and chest pain at PAT appointment   All instructions explained to the patient, with a verbal understanding of the material. Patient agrees to go over the instructions while at home for a better understanding. Patient also instructed to self quarantine after being tested for COVID-19. The opportunity to ask questions was provided.

## 2022-02-16 ENCOUNTER — Other Ambulatory Visit (HOSPITAL_COMMUNITY)
Admission: RE | Admit: 2022-02-16 | Discharge: 2022-02-16 | Disposition: A | Discharge status: Home / Self Care | Payer: Medicaid Other | Source: Ambulatory Visit | Attending: Neurological Surgery | Admitting: Neurological Surgery

## 2022-02-16 DIAGNOSIS — Z20822 Contact with and (suspected) exposure to covid-19: Secondary | ICD-10-CM | POA: Insufficient documentation

## 2022-02-16 DIAGNOSIS — Z01812 Encounter for preprocedural laboratory examination: Secondary | ICD-10-CM | POA: Insufficient documentation

## 2022-02-16 DIAGNOSIS — Z01818 Encounter for other preprocedural examination: Secondary | ICD-10-CM

## 2022-02-16 LAB — SARS CORONAVIRUS 2 BY RT PCR (HOSPITAL ORDER, PERFORMED IN ~~LOC~~ HOSPITAL LAB): SARS Coronavirus 2: NEGATIVE

## 2022-02-18 NOTE — Anesthesia Preprocedure Evaluation (Addendum)
Anesthesia Evaluation  ?Patient identified by MRN, date of birth, ID band ?Patient awake ? ? ? ?Reviewed: ?Allergy & Precautions, NPO status , Patient's Chart, lab work & pertinent test results ? ?History of Anesthesia Complications ?Negative for: history of anesthetic complications ? ?Airway ?Mallampati: III ? ?TM Distance: >3 FB ?Neck ROM: Full ? ? ? Dental ? ?(+) Dental Advisory Given ?  ?Pulmonary ?asthma , sleep apnea (noncompliant) , former smoker,  ?  ?Pulmonary exam normal ? ? ? ? ? ? ? Cardiovascular ?hypertension, Pt. on medications ?+ DVT  ?Normal cardiovascular exam ? ? ?  ?Neuro/Psych ? Headaches, PSYCHIATRIC DISORDERS Anxiety Depression  Neuromuscular disease   ? GI/Hepatic ?Neg liver ROS, GERD  Medicated and Controlled,  ?Endo/Other  ?diabetes, Type 2Morbid obesity ? Renal/GU ?negative Renal ROS  ? ?  ?Musculoskeletal ? ?(+) Arthritis ,  ?Chronic pain ?  ? Abdominal ?(+) + obese,   ?Peds ? Hematology ? ?Plt 130k ?   ?Anesthesia Other Findings ? ? Reproductive/Obstetrics ? ?  ? ? ? ? ? ? ? ? ? ? ? ? ? ?  ?  ? ? ? ? ? ? ? ?Anesthesia Physical ?Anesthesia Plan ? ?ASA: 3 ? ?Anesthesia Plan: General  ? ?Post-op Pain Management: Tylenol PO (pre-op)*, Ketamine IV* and Celebrex PO (pre-op)*  ? ?Induction: Intravenous ? ?PONV Risk Score and Plan: 2 and Treatment may vary due to age or medical condition, Ondansetron, Dexamethasone and Midazolam ? ?Airway Management Planned: Oral ETT ? ?Additional Equipment: None ? ?Intra-op Plan:  ? ?Post-operative Plan: Extubation in OR ? ?Informed Consent: I have reviewed the patients History and Physical, chart, labs and discussed the procedure including the risks, benefits and alternatives for the proposed anesthesia with the patient or authorized representative who has indicated his/her understanding and acceptance.  ? ? ? ?Dental advisory given ? ?Plan Discussed with: CRNA and Anesthesiologist ? ?Anesthesia Plan Comments:    ? ? ? ? ? ?Anesthesia Quick Evaluation ? ?

## 2022-02-19 ENCOUNTER — Other Ambulatory Visit: Payer: Self-pay

## 2022-02-19 ENCOUNTER — Encounter (HOSPITAL_COMMUNITY)
Admission: RE | Disposition: A | Discharge status: Home / Self Care | Payer: Self-pay | Source: Home / Self Care | Attending: Neurological Surgery

## 2022-02-19 ENCOUNTER — Inpatient Hospital Stay (HOSPITAL_COMMUNITY): Payer: Medicaid Other | Admitting: Certified Registered Nurse Anesthetist

## 2022-02-19 ENCOUNTER — Inpatient Hospital Stay (HOSPITAL_COMMUNITY)
Admission: RE | Admit: 2022-02-19 | Discharge: 2022-03-02 | DRG: 459 | Disposition: A | Discharge status: Home / Self Care | Payer: Medicaid Other | Attending: Neurological Surgery | Admitting: Neurological Surgery

## 2022-02-19 ENCOUNTER — Inpatient Hospital Stay (HOSPITAL_COMMUNITY): Payer: Medicaid Other

## 2022-02-19 ENCOUNTER — Encounter (HOSPITAL_COMMUNITY): Payer: Self-pay | Admitting: Neurological Surgery

## 2022-02-19 DIAGNOSIS — F17210 Nicotine dependence, cigarettes, uncomplicated: Secondary | ICD-10-CM | POA: Diagnosis present

## 2022-02-19 DIAGNOSIS — Z833 Family history of diabetes mellitus: Secondary | ICD-10-CM | POA: Diagnosis not present

## 2022-02-19 DIAGNOSIS — J9601 Acute respiratory failure with hypoxia: Secondary | ICD-10-CM | POA: Diagnosis not present

## 2022-02-19 DIAGNOSIS — F17200 Nicotine dependence, unspecified, uncomplicated: Secondary | ICD-10-CM | POA: Diagnosis present

## 2022-02-19 DIAGNOSIS — R7989 Other specified abnormal findings of blood chemistry: Secondary | ICD-10-CM | POA: Diagnosis not present

## 2022-02-19 DIAGNOSIS — Z800 Family history of malignant neoplasm of digestive organs: Secondary | ICD-10-CM

## 2022-02-19 DIAGNOSIS — Z7901 Long term (current) use of anticoagulants: Secondary | ICD-10-CM

## 2022-02-19 DIAGNOSIS — R339 Retention of urine, unspecified: Secondary | ICD-10-CM | POA: Diagnosis present

## 2022-02-19 DIAGNOSIS — K219 Gastro-esophageal reflux disease without esophagitis: Secondary | ICD-10-CM | POA: Diagnosis present

## 2022-02-19 DIAGNOSIS — J9622 Acute and chronic respiratory failure with hypercapnia: Secondary | ICD-10-CM | POA: Diagnosis not present

## 2022-02-19 DIAGNOSIS — E7220 Disorder of urea cycle metabolism, unspecified: Secondary | ICD-10-CM

## 2022-02-19 DIAGNOSIS — M48062 Spinal stenosis, lumbar region with neurogenic claudication: Principal | ICD-10-CM | POA: Diagnosis present

## 2022-02-19 DIAGNOSIS — Z419 Encounter for procedure for purposes other than remedying health state, unspecified: Secondary | ICD-10-CM

## 2022-02-19 DIAGNOSIS — D649 Anemia, unspecified: Secondary | ICD-10-CM | POA: Diagnosis not present

## 2022-02-19 DIAGNOSIS — Z91199 Patient's noncompliance with other medical treatment and regimen due to unspecified reason: Secondary | ICD-10-CM

## 2022-02-19 DIAGNOSIS — R0602 Shortness of breath: Secondary | ICD-10-CM

## 2022-02-19 DIAGNOSIS — T40605A Adverse effect of unspecified narcotics, initial encounter: Secondary | ICD-10-CM | POA: Diagnosis not present

## 2022-02-19 DIAGNOSIS — Z888 Allergy status to other drugs, medicaments and biological substances status: Secondary | ICD-10-CM

## 2022-02-19 DIAGNOSIS — F418 Other specified anxiety disorders: Secondary | ICD-10-CM | POA: Diagnosis not present

## 2022-02-19 DIAGNOSIS — E1151 Type 2 diabetes mellitus with diabetic peripheral angiopathy without gangrene: Secondary | ICD-10-CM | POA: Diagnosis present

## 2022-02-19 DIAGNOSIS — I82409 Acute embolism and thrombosis of unspecified deep veins of unspecified lower extremity: Secondary | ICD-10-CM | POA: Diagnosis not present

## 2022-02-19 DIAGNOSIS — G894 Chronic pain syndrome: Secondary | ICD-10-CM | POA: Diagnosis present

## 2022-02-19 DIAGNOSIS — E871 Hypo-osmolality and hyponatremia: Secondary | ICD-10-CM | POA: Diagnosis not present

## 2022-02-19 DIAGNOSIS — E662 Morbid (severe) obesity with alveolar hypoventilation: Secondary | ICD-10-CM

## 2022-02-19 DIAGNOSIS — R451 Restlessness and agitation: Secondary | ICD-10-CM

## 2022-02-19 DIAGNOSIS — Z6841 Body Mass Index (BMI) 40.0 and over, adult: Secondary | ICD-10-CM | POA: Diagnosis not present

## 2022-02-19 DIAGNOSIS — Z841 Family history of disorders of kidney and ureter: Secondary | ICD-10-CM

## 2022-02-19 DIAGNOSIS — N179 Acute kidney failure, unspecified: Secondary | ICD-10-CM | POA: Diagnosis not present

## 2022-02-19 DIAGNOSIS — J9621 Acute and chronic respiratory failure with hypoxia: Secondary | ICD-10-CM | POA: Diagnosis not present

## 2022-02-19 DIAGNOSIS — M5416 Radiculopathy, lumbar region: Secondary | ICD-10-CM | POA: Diagnosis present

## 2022-02-19 DIAGNOSIS — I719 Aortic aneurysm of unspecified site, without rupture: Secondary | ICD-10-CM

## 2022-02-19 DIAGNOSIS — Z79899 Other long term (current) drug therapy: Secondary | ICD-10-CM

## 2022-02-19 DIAGNOSIS — R590 Localized enlarged lymph nodes: Secondary | ICD-10-CM

## 2022-02-19 DIAGNOSIS — M545 Low back pain, unspecified: Secondary | ICD-10-CM | POA: Diagnosis present

## 2022-02-19 DIAGNOSIS — G9341 Metabolic encephalopathy: Secondary | ICD-10-CM | POA: Diagnosis not present

## 2022-02-19 DIAGNOSIS — Z8616 Personal history of COVID-19: Secondary | ICD-10-CM

## 2022-02-19 DIAGNOSIS — M1990 Unspecified osteoarthritis, unspecified site: Secondary | ICD-10-CM | POA: Diagnosis present

## 2022-02-19 DIAGNOSIS — E119 Type 2 diabetes mellitus without complications: Secondary | ICD-10-CM | POA: Diagnosis not present

## 2022-02-19 DIAGNOSIS — K2100 Gastro-esophageal reflux disease with esophagitis, without bleeding: Secondary | ICD-10-CM | POA: Diagnosis not present

## 2022-02-19 DIAGNOSIS — J9602 Acute respiratory failure with hypercapnia: Secondary | ICD-10-CM | POA: Diagnosis not present

## 2022-02-19 DIAGNOSIS — Z8042 Family history of malignant neoplasm of prostate: Secondary | ICD-10-CM | POA: Diagnosis not present

## 2022-02-19 DIAGNOSIS — R0609 Other forms of dyspnea: Secondary | ICD-10-CM | POA: Diagnosis not present

## 2022-02-19 HISTORY — PX: TRANSFORAMINAL LUMBAR INTERBODY FUSION W/ MIS 2 LEVEL: SHX6146

## 2022-02-19 HISTORY — PX: CYSTOSCOPY: SHX5120

## 2022-02-19 SURGERY — MINIMALLY INVASIVE (MIS) TRANSFORAMINAL LUMBAR INTERBODY FUSION (TLIF) 2 LEVEL
Anesthesia: General | Site: Penis

## 2022-02-19 MED ORDER — STERILE WATER FOR IRRIGATION IR SOLN
Status: DC | PRN
  Administered 2022-02-19: 1000 mL via INTRAVESICAL

## 2022-02-19 MED ORDER — FENTANYL CITRATE (PF) 100 MCG/2ML IJ SOLN
25.0000 ug | INTRAMUSCULAR | Status: DC | PRN
  Administered 2022-02-19 (×2): 25 ug via INTRAVENOUS
  Administered 2022-02-19: 50 ug via INTRAVENOUS

## 2022-02-19 MED ORDER — LIDOCAINE-EPINEPHRINE 1 %-1:100000 IJ SOLN
INTRAMUSCULAR | Status: DC | PRN
  Administered 2022-02-19: 10 mL

## 2022-02-19 MED ORDER — ROCURONIUM BROMIDE 10 MG/ML (PF) SYRINGE
PREFILLED_SYRINGE | INTRAVENOUS | Status: AC
  Filled 2022-02-19: qty 10

## 2022-02-19 MED ORDER — PHENYLEPHRINE 40 MCG/ML (10ML) SYRINGE FOR IV PUSH (FOR BLOOD PRESSURE SUPPORT)
PREFILLED_SYRINGE | INTRAVENOUS | Status: DC | PRN
  Administered 2022-02-19: 160 ug via INTRAVENOUS
  Administered 2022-02-19 (×2): 120 ug via INTRAVENOUS
  Administered 2022-02-19: 80 ug via INTRAVENOUS
  Administered 2022-02-19: 40 ug via INTRAVENOUS
  Administered 2022-02-19: 80 ug via INTRAVENOUS

## 2022-02-19 MED ORDER — SODIUM CHLORIDE 0.9% FLUSH
3.0000 mL | Freq: Two times a day (BID) | INTRAVENOUS | Status: DC
  Administered 2022-02-19 – 2022-03-02 (×19): 3 mL via INTRAVENOUS

## 2022-02-19 MED ORDER — DEXAMETHASONE SODIUM PHOSPHATE 10 MG/ML IJ SOLN
INTRAMUSCULAR | Status: DC | PRN
Start: 2022-02-19 — End: 2022-02-19
  Administered 2022-02-19: 10 mg via INTRAVENOUS

## 2022-02-19 MED ORDER — CHLORHEXIDINE GLUCONATE CLOTH 2 % EX PADS: 6.0000 | MEDICATED_PAD | Freq: Once | CUTANEOUS | Status: DC

## 2022-02-19 MED ORDER — THROMBIN 5000 UNITS EX SOLR
CUTANEOUS | Status: AC
  Filled 2022-02-19: qty 5000

## 2022-02-19 MED ORDER — ONDANSETRON HCL 4 MG/2ML IJ SOLN: 4.0000 mg | Freq: Four times a day (QID) | INTRAMUSCULAR | Status: DC | PRN

## 2022-02-19 MED ORDER — LIDOCAINE-EPINEPHRINE 1 %-1:100000 IJ SOLN
INTRAMUSCULAR | Status: AC
  Filled 2022-02-19: qty 1

## 2022-02-19 MED ORDER — DEXAMETHASONE SODIUM PHOSPHATE 10 MG/ML IJ SOLN
INTRAMUSCULAR | Status: AC
  Filled 2022-02-19: qty 1

## 2022-02-19 MED ORDER — SUCCINYLCHOLINE CHLORIDE 200 MG/10ML IV SOSY
PREFILLED_SYRINGE | INTRAVENOUS | Status: DC | PRN
  Administered 2022-02-19: 140 mg via INTRAVENOUS

## 2022-02-19 MED ORDER — IPRATROPIUM-ALBUTEROL 20-100 MCG/ACT IN AERS: 1.0000 | INHALATION_SPRAY | Freq: Four times a day (QID) | RESPIRATORY_TRACT | Status: DC | PRN

## 2022-02-19 MED ORDER — ROCURONIUM BROMIDE 10 MG/ML (PF) SYRINGE
PREFILLED_SYRINGE | INTRAVENOUS | Status: DC | PRN
  Administered 2022-02-19 (×3): 10 mg via INTRAVENOUS
  Administered 2022-02-19: 20 mg via INTRAVENOUS
  Administered 2022-02-19: 80 mg via INTRAVENOUS
  Administered 2022-02-19: 20 mg via INTRAVENOUS

## 2022-02-19 MED ORDER — SUCCINYLCHOLINE CHLORIDE 200 MG/10ML IV SOSY
PREFILLED_SYRINGE | INTRAVENOUS | Status: AC
  Filled 2022-02-19: qty 10

## 2022-02-19 MED ORDER — EPHEDRINE 5 MG/ML INJ
INTRAVENOUS | Status: AC
  Filled 2022-02-19: qty 5

## 2022-02-19 MED ORDER — MIDAZOLAM HCL 5 MG/5ML IJ SOLN
INTRAMUSCULAR | Status: DC | PRN
  Administered 2022-02-19: 2 mg via INTRAVENOUS

## 2022-02-19 MED ORDER — LOSARTAN POTASSIUM 50 MG PO TABS
50.0000 mg | ORAL_TABLET | Freq: Every day | ORAL | Status: DC
  Administered 2022-02-20 – 2022-02-23 (×4): 50 mg via ORAL
  Filled 2022-02-19 (×4): qty 1

## 2022-02-19 MED ORDER — ACETAMINOPHEN 500 MG PO TABS
1000.0000 mg | ORAL_TABLET | Freq: Once | ORAL | Status: AC
  Administered 2022-02-19: 1000 mg via ORAL

## 2022-02-19 MED ORDER — IPRATROPIUM-ALBUTEROL 0.5-2.5 (3) MG/3ML IN SOLN: 3.0000 mL | RESPIRATORY_TRACT | Status: DC | PRN

## 2022-02-19 MED ORDER — CYCLOBENZAPRINE HCL 10 MG PO TABS
10.0000 mg | ORAL_TABLET | Freq: Three times a day (TID) | ORAL | Status: DC | PRN
  Administered 2022-02-19 – 2022-03-01 (×10): 10 mg via ORAL
  Filled 2022-02-19 (×11): qty 1

## 2022-02-19 MED ORDER — CEFAZOLIN SODIUM-DEXTROSE 2-4 GM/100ML-% IV SOLN
INTRAVENOUS | Status: AC
  Filled 2022-02-19: qty 100

## 2022-02-19 MED ORDER — PROPOFOL 10 MG/ML IV BOLUS
INTRAVENOUS | Status: AC
  Filled 2022-02-19: qty 20

## 2022-02-19 MED ORDER — KETAMINE HCL 10 MG/ML IJ SOLN
INTRAMUSCULAR | Status: DC | PRN
  Administered 2022-02-19: 50 mg via INTRAVENOUS

## 2022-02-19 MED ORDER — CELECOXIB 200 MG PO CAPS
ORAL_CAPSULE | ORAL | Status: AC
  Filled 2022-02-19: qty 1

## 2022-02-19 MED ORDER — ORAL CARE MOUTH RINSE: 15.0000 mL | Freq: Once | OROMUCOSAL | Status: AC

## 2022-02-19 MED ORDER — FENTANYL CITRATE (PF) 250 MCG/5ML IJ SOLN
INTRAMUSCULAR | Status: AC
  Filled 2022-02-19: qty 5

## 2022-02-19 MED ORDER — HYDROMORPHONE HCL 1 MG/ML IJ SOLN
1.0000 mg | INTRAMUSCULAR | Status: DC | PRN
  Administered 2022-02-20 – 2022-02-24 (×9): 1 mg via INTRAVENOUS
  Filled 2022-02-19 (×11): qty 1

## 2022-02-19 MED ORDER — LIDOCAINE HCL URETHRAL/MUCOSAL 2 % EX GEL
CUTANEOUS | Status: AC
  Filled 2022-02-19: qty 11

## 2022-02-19 MED ORDER — PROPOFOL 10 MG/ML IV BOLUS
INTRAVENOUS | Status: DC | PRN
  Administered 2022-02-19: 200 mg via INTRAVENOUS
  Administered 2022-02-19: 30 mg via INTRAVENOUS

## 2022-02-19 MED ORDER — LIDOCAINE 2% (20 MG/ML) 5 ML SYRINGE
INTRAMUSCULAR | Status: DC | PRN
Start: 2022-02-19 — End: 2022-02-19
  Administered 2022-02-19: 60 mg via INTRAVENOUS

## 2022-02-19 MED ORDER — POLYETHYLENE GLYCOL 3350 17 G PO PACK: 17.0000 g | PACK | Freq: Every day | ORAL | Status: DC | PRN

## 2022-02-19 MED ORDER — EPHEDRINE SULFATE-NACL 50-0.9 MG/10ML-% IV SOSY
PREFILLED_SYRINGE | INTRAVENOUS | Status: DC | PRN
  Administered 2022-02-19 (×2): 10 mg via INTRAVENOUS

## 2022-02-19 MED ORDER — CEFAZOLIN IN SODIUM CHLORIDE 3-0.9 GM/100ML-% IV SOLN
3.0000 g | INTRAVENOUS | Status: AC
  Administered 2022-02-19 (×2): 3 g via INTRAVENOUS
  Filled 2022-02-19: qty 100

## 2022-02-19 MED ORDER — SODIUM CHLORIDE 0.9 % IV SOLN: 250.0000 mL | INTRAVENOUS | Status: DC

## 2022-02-19 MED ORDER — OXYCODONE HCL 5 MG PO TABS: 5.0000 mg | ORAL_TABLET | Freq: Once | ORAL | Status: DC | PRN

## 2022-02-19 MED ORDER — PHENYLEPHRINE HCL-NACL 20-0.9 MG/250ML-% IV SOLN
INTRAVENOUS | Status: DC | PRN
  Administered 2022-02-19: 25 ug/min via INTRAVENOUS

## 2022-02-19 MED ORDER — OXYCODONE HCL 5 MG PO TABS
10.0000 mg | ORAL_TABLET | ORAL | Status: DC | PRN
  Administered 2022-02-19 – 2022-03-01 (×26): 10 mg via ORAL
  Filled 2022-02-19 (×26): qty 2

## 2022-02-19 MED ORDER — CHLORHEXIDINE GLUCONATE 0.12 % MT SOLN
15.0000 mL | Freq: Once | OROMUCOSAL | Status: AC
  Administered 2022-02-19: 15 mL via OROMUCOSAL

## 2022-02-19 MED ORDER — LACTATED RINGERS IV SOLN: INTRAVENOUS | Status: DC

## 2022-02-19 MED ORDER — ONDANSETRON HCL 4 MG/2ML IJ SOLN: 4.0000 mg | Freq: Once | INTRAMUSCULAR | Status: DC | PRN

## 2022-02-19 MED ORDER — ONDANSETRON HCL 4 MG/2ML IJ SOLN
INTRAMUSCULAR | Status: DC | PRN
  Administered 2022-02-19: 4 mg via INTRAVENOUS

## 2022-02-19 MED ORDER — SUGAMMADEX SODIUM 200 MG/2ML IV SOLN
INTRAVENOUS | Status: DC | PRN
  Administered 2022-02-19: 400 mg via INTRAVENOUS

## 2022-02-19 MED ORDER — FENTANYL CITRATE (PF) 250 MCG/5ML IJ SOLN
INTRAMUSCULAR | Status: DC | PRN
  Administered 2022-02-19 (×7): 50 ug via INTRAVENOUS

## 2022-02-19 MED ORDER — OXYCODONE HCL 5 MG/5ML PO SOLN: 5.0000 mg | Freq: Once | ORAL | Status: DC | PRN

## 2022-02-19 MED ORDER — SODIUM CHLORIDE 0.9% FLUSH: 3.0000 mL | INTRAVENOUS | Status: DC | PRN

## 2022-02-19 MED ORDER — CEFAZOLIN SODIUM 1 G IJ SOLR
INTRAMUSCULAR | Status: AC
  Filled 2022-02-19: qty 30

## 2022-02-19 MED ORDER — OXYCODONE HCL 5 MG PO TABS
5.0000 mg | ORAL_TABLET | ORAL | Status: DC | PRN
  Administered 2022-03-01: 5 mg via ORAL
  Filled 2022-02-19: qty 1

## 2022-02-19 MED ORDER — DOCUSATE SODIUM 100 MG PO CAPS
100.0000 mg | ORAL_CAPSULE | Freq: Two times a day (BID) | ORAL | Status: DC
  Administered 2022-02-19 – 2022-03-01 (×19): 100 mg via ORAL
  Filled 2022-02-19 (×22): qty 1

## 2022-02-19 MED ORDER — ACETAMINOPHEN 650 MG RE SUPP: 650.0000 mg | RECTAL | Status: DC | PRN

## 2022-02-19 MED ORDER — HYDROMORPHONE HCL 1 MG/ML IJ SOLN
INTRAMUSCULAR | Status: AC
  Filled 2022-02-19: qty 1

## 2022-02-19 MED ORDER — THROMBIN 5000 UNITS EX SOLR: OROMUCOSAL | Status: DC | PRN

## 2022-02-19 MED ORDER — ACETAMINOPHEN 325 MG PO TABS
650.0000 mg | ORAL_TABLET | ORAL | Status: DC | PRN
  Administered 2022-02-23 (×2): 650 mg via ORAL
  Filled 2022-02-19 (×2): qty 2

## 2022-02-19 MED ORDER — CEFAZOLIN SODIUM-DEXTROSE 2-4 GM/100ML-% IV SOLN
2.0000 g | Freq: Three times a day (TID) | INTRAVENOUS | Status: AC
  Administered 2022-02-19 – 2022-02-20 (×2): 2 g via INTRAVENOUS
  Filled 2022-02-19 (×2): qty 100

## 2022-02-19 MED ORDER — PREGABALIN 75 MG PO CAPS
150.0000 mg | ORAL_CAPSULE | Freq: Every day | ORAL | Status: DC
  Administered 2022-02-20 – 2022-03-02 (×10): 150 mg via ORAL
  Filled 2022-02-19 (×11): qty 2

## 2022-02-19 MED ORDER — MENTHOL 3 MG MT LOZG
1.0000 | LOZENGE | OROMUCOSAL | Status: DC | PRN
  Filled 2022-02-19: qty 9

## 2022-02-19 MED ORDER — SODIUM CHLORIDE (PF) 0.9 % IJ SOLN
INTRAMUSCULAR | Status: AC
  Filled 2022-02-19: qty 20

## 2022-02-19 MED ORDER — ALBUMIN HUMAN 5 % IV SOLN: INTRAVENOUS | Status: DC | PRN

## 2022-02-19 MED ORDER — KETAMINE HCL 50 MG/5ML IJ SOSY
PREFILLED_SYRINGE | INTRAMUSCULAR | Status: AC
  Filled 2022-02-19: qty 5

## 2022-02-19 MED ORDER — ONDANSETRON HCL 4 MG/2ML IJ SOLN
INTRAMUSCULAR | Status: AC
  Filled 2022-02-19: qty 2

## 2022-02-19 MED ORDER — CHLORHEXIDINE GLUCONATE 0.12 % MT SOLN
OROMUCOSAL | Status: AC
  Filled 2022-02-19: qty 15

## 2022-02-19 MED ORDER — ACETAMINOPHEN 500 MG PO TABS
ORAL_TABLET | ORAL | Status: AC
  Filled 2022-02-19: qty 2

## 2022-02-19 MED ORDER — MIDAZOLAM HCL 2 MG/2ML IJ SOLN
INTRAMUSCULAR | Status: AC
  Filled 2022-02-19: qty 2

## 2022-02-19 MED ORDER — FENTANYL CITRATE (PF) 100 MCG/2ML IJ SOLN
INTRAMUSCULAR | Status: AC
  Filled 2022-02-19: qty 2

## 2022-02-19 MED ORDER — PHENOL 1.4 % MT LIQD: 1.0000 | OROMUCOSAL | Status: DC | PRN

## 2022-02-19 MED ORDER — LIDOCAINE 2% (20 MG/ML) 5 ML SYRINGE
INTRAMUSCULAR | Status: AC
  Filled 2022-02-19: qty 5

## 2022-02-19 MED ORDER — 0.9 % SODIUM CHLORIDE (POUR BTL) OPTIME
TOPICAL | Status: DC | PRN
  Administered 2022-02-19: 1000 mL

## 2022-02-19 MED ORDER — PHENYLEPHRINE 40 MCG/ML (10ML) SYRINGE FOR IV PUSH (FOR BLOOD PRESSURE SUPPORT)
PREFILLED_SYRINGE | INTRAVENOUS | Status: AC
  Filled 2022-02-19: qty 10

## 2022-02-19 MED ORDER — CELECOXIB 200 MG PO CAPS
200.0000 mg | ORAL_CAPSULE | Freq: Once | ORAL | Status: AC
  Administered 2022-02-19: 200 mg via ORAL

## 2022-02-19 MED ORDER — HYDROMORPHONE HCL 1 MG/ML IJ SOLN
0.2500 mg | INTRAMUSCULAR | Status: DC | PRN
  Administered 2022-02-19: 0.5 mg via INTRAVENOUS

## 2022-02-19 MED ORDER — ONDANSETRON HCL 4 MG PO TABS: 4.0000 mg | ORAL_TABLET | Freq: Four times a day (QID) | ORAL | Status: DC | PRN

## 2022-02-19 SURGICAL SUPPLY — 70 items
ADH SKN CLS APL DERMABOND .7 (GAUZE/BANDAGES/DRESSINGS) ×2
BAG COUNTER SPONGE SURGICOUNT (BAG) ×4 IMPLANT
BAG SPNG CNTER NS LX DISP (BAG) ×2
BAND INSRT 18 STRL LF DISP RB (MISCELLANEOUS) ×4
BAND RUBBER #18 3X1/16 STRL (MISCELLANEOUS) ×8 IMPLANT
BASKET BONE COLLECTION (BASKET) ×4 IMPLANT
BLADE CLIPPER SURG (BLADE) IMPLANT
BLADE SURG 11 STRL SS (BLADE) ×4 IMPLANT
BUR MATCHSTICK NEURO 3.0 LAGG (BURR) ×1 IMPLANT
BUR ROUND PRECISION 4.0 (BURR) ×4 IMPLANT
CAGE EXP CATALYFT 9 (Plate) ×2 IMPLANT
CATH COUDE FOLEY 2W 5CC 16FR (CATHETERS) ×2 IMPLANT
CATH FOLEY 2W COUNCIL 20FR 5CC (CATHETERS) ×1 IMPLANT
CATH FOLEY 2W COUNCIL 5CC 16FR (CATHETERS) ×1 IMPLANT
CNTNR URN SCR LID CUP LEK RST (MISCELLANEOUS) ×3 IMPLANT
CONT SPEC 4OZ STRL OR WHT (MISCELLANEOUS) ×3
COVER BACK TABLE 60X90IN (DRAPES) ×4 IMPLANT
DECANTER SPIKE VIAL GLASS SM (MISCELLANEOUS) ×3 IMPLANT
DERMABOND ADVANCED (GAUZE/BANDAGES/DRESSINGS) ×1
DERMABOND ADVANCED .7 DNX12 (GAUZE/BANDAGES/DRESSINGS) ×3 IMPLANT
DRAPE C-ARM 42X72 X-RAY (DRAPES) ×4 IMPLANT
DRAPE C-ARMOR (DRAPES) ×4 IMPLANT
DRAPE LAPAROTOMY 100X72X124 (DRAPES) ×4 IMPLANT
DRAPE MICROSCOPE LEICA (MISCELLANEOUS) ×4 IMPLANT
DRAPE SURG 17X23 STRL (DRAPES) ×6 IMPLANT
ELECT BLADE 6.5 EXT (BLADE) ×4 IMPLANT
ELECT REM PT RETURN 9FT ADLT (ELECTROSURGICAL) ×3
ELECTRODE REM PT RTRN 9FT ADLT (ELECTROSURGICAL) ×3 IMPLANT
EXTENDER TAB GUIDE SV 5.5/6.0 (INSTRUMENTS) ×12 IMPLANT
GAUZE 4X4 16PLY ~~LOC~~+RFID DBL (SPONGE) ×2 IMPLANT
GAUZE SPONGE 4X4 12PLY STRL (GAUZE/BANDAGES/DRESSINGS) ×3 IMPLANT
GLOVE SURG LTX SZ7.5 (GLOVE) ×4 IMPLANT
GLOVE SURG UNDER POLY LF SZ7.5 (GLOVE) ×4 IMPLANT
GOWN STRL REUS W/ TWL LRG LVL3 (GOWN DISPOSABLE) ×3 IMPLANT
GOWN STRL REUS W/ TWL XL LVL3 (GOWN DISPOSABLE) IMPLANT
GOWN STRL REUS W/TWL 2XL LVL3 (GOWN DISPOSABLE) IMPLANT
GOWN STRL REUS W/TWL LRG LVL3 (GOWN DISPOSABLE) ×12
GOWN STRL REUS W/TWL XL LVL3 (GOWN DISPOSABLE)
GUIDEWIRE ANG ZIPWIRE 038X150 (WIRE) ×1 IMPLANT
GUIDEWIRE BLUNT NT 450 (WIRE) ×6 IMPLANT
HEMOSTAT POWDER KIT SURGIFOAM (HEMOSTASIS) ×4 IMPLANT
KIT BASIN OR (CUSTOM PROCEDURE TRAY) ×4 IMPLANT
KIT INFUSE SMALL (Orthopedic Implant) ×1 IMPLANT
KIT POSITION SURG JACKSON T1 (MISCELLANEOUS) ×4 IMPLANT
KIT TURNOVER KIT B (KITS) ×1 IMPLANT
NDL BEVEL TWO-PAK W/1PK (NEEDLE) IMPLANT
NDL HYPO 18GX1.5 BLUNT FILL (NEEDLE) IMPLANT
NDL SPNL 18GX3.5 QUINCKE PK (NEEDLE) IMPLANT
NEEDLE BEVEL TWO-PAK W/1PK (NEEDLE) ×6 IMPLANT
NEEDLE HYPO 18GX1.5 BLUNT FILL (NEEDLE) IMPLANT
NEEDLE HYPO 22GX1.5 SAFETY (NEEDLE) ×4 IMPLANT
NEEDLE SPNL 18GX3.5 QUINCKE PK (NEEDLE) ×3 IMPLANT
NS IRRIG 1000ML POUR BTL (IV SOLUTION) ×4 IMPLANT
PACK LAMINECTOMY NEURO (CUSTOM PROCEDURE TRAY) ×4 IMPLANT
PAD ARMBOARD 7.5X6 YLW CONV (MISCELLANEOUS) ×10 IMPLANT
ROD PERC CCM 5.5X65 (Rod) ×2 IMPLANT
SCREW MAS VOYAGER 6.5X40 (Screw) ×6 IMPLANT
SCREW SET 5.5/6.0MM SOLERA (Screw) ×6 IMPLANT
SET CYSTO W/LG BORE CLAMP LF (SET/KITS/TRAYS/PACK) ×1 IMPLANT
SPONGE T-LAP 4X18 ~~LOC~~+RFID (SPONGE) ×1 IMPLANT
SUT MNCRL AB 3-0 PS2 18 (SUTURE) ×4 IMPLANT
SUT VIC AB 0 CT1 18XCR BRD8 (SUTURE) IMPLANT
SUT VIC AB 0 CT1 8-18 (SUTURE)
SUT VIC AB 2-0 CP2 18 (SUTURE) ×4 IMPLANT
SYR 3ML LL SCALE MARK (SYRINGE) IMPLANT
TOWEL GREEN STERILE (TOWEL DISPOSABLE) ×4 IMPLANT
TOWEL GREEN STERILE FF (TOWEL DISPOSABLE) ×5 IMPLANT
TRAY FOLEY MTR SLVR 16FR STAT (SET/KITS/TRAYS/PACK) ×1 IMPLANT
WATER STERILE IRR 1000ML POUR (IV SOLUTION) ×4 IMPLANT
WATER STERILE IRR 1000ML UROMA (IV SOLUTION) ×1 IMPLANT

## 2022-02-19 NOTE — OR Nursing (Signed)
Foley placement attempted with regular 16 fr foley and 16 fr coude catheter by J. Annamary Buschman RN and Dr. Zada Finders.  Urologist on call paged and arrived at 51.  Dr. Lovena Neighbours then performed flexible cystoscopy to place foley. ?

## 2022-02-19 NOTE — Discharge Summary (Incomplete)
Discharge Summary  Date of Admission: 02/19/2022  Date of Discharge: ***  Attending Physician: Emelda Brothers, MD  Hospital Course: Patient was admitted following an L4-5/L5-S1 MIS decompression and TLIF. Operative course was complicated by urethral stricture preventing foley placement, urology placed one intra-op with plan to keep it in place and have it removed after discharge. Post-op, they were recovered in PACU and transferred to 4NP. Their preop symptoms were completely resolved, their hospital course was complicated by hypercarbic respiratory failure that improved with use of home BiPAP, CT PE study was negative. He had continued worsening delirium and, given the degree of delirium and respiratory issues, had to be transferred to the unit for sedation and closer monitoring. [facility] course was otherwise uncomplicated and the patient was discharged to SNF on ***. They will follow up in clinic with me in clinic in 2 weeks.  Neurologic exam at discharge:  Strength 5/5 x4 and SILTx4 except baseline b/l stocking distribution numbness and right ankle dec'd ROM   Discharge diagnosis: lumbar stenosis with neurogenic claudication, lumbar radiculopathy  Judith Part, MD 02/19/22 5:01 PM
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
## 2022-02-19 NOTE — Transfer of Care (Signed)
Immediate Anesthesia Transfer of Care Note ? ?Patient: BRANDY KABAT ? ?Procedure(s) Performed: Lumbar four-five, Lumbar five-Sacral one Minimally Invasive Laminectomies and Transforaminal Lumbar Interbody Fusion with Metrx (Back) ?BEDSIDE CYSTOSCOPY for foley catheter placement  (Penis) ? ?Patient Location: PACU ? ?Anesthesia Type:General ? ?Level of Consciousness: drowsy ? ?Airway & Oxygen Therapy: Patient Spontanous Breathing and Patient connected to face mask oxygen ? ?Post-op Assessment: Report given to RN, Post -op Vital signs reviewed and stable and Patient moving all extremities X 4 ? ?Post vital signs: Reviewed and stable ? ?Last Vitals:  ?Vitals Value Taken Time  ?BP 209/83 02/19/22 1625  ?Temp    ?Pulse 114 02/19/22 1628  ?Resp 27 02/19/22 1628  ?SpO2 97 % 02/19/22 1628  ?Vitals shown include unvalidated device data. ? ?Last Pain:  ?Vitals:  ? 02/19/22 0615  ?PainSc: 8   ?   ? ?  ? ?Complications: No notable events documented. ?

## 2022-02-19 NOTE — Anesthesia Procedure Notes (Signed)
Procedure Name: Intubation ?Date/Time: 02/19/2022 7:56 AM ?Performed by: Colin Benton, CRNA ?Pre-anesthesia Checklist: Patient identified, Emergency Drugs available, Suction available and Patient being monitored ?Patient Re-evaluated:Patient Re-evaluated prior to induction ?Oxygen Delivery Method: Circle system utilized ?Preoxygenation: Pre-oxygenation with 100% oxygen ?Induction Type: IV induction ?Ventilation: Mask ventilation with difficulty ?Laryngoscope Size: Mac and 4 ?Grade View: Grade II ?Tube type: Oral ?Tube size: 7.5 mm ?Number of attempts: 1 ?Airway Equipment and Method: Stylet ?Placement Confirmation: ETT inserted through vocal cords under direct vision, positive ETCO2 and breath sounds checked- equal and bilateral ?Secured at: 24 cm ?Tube secured with: Tape ?Dental Injury: Teeth and Oropharynx as per pre-operative assessment  ? ? ? ? ?

## 2022-02-19 NOTE — Op Note (Signed)
PATIENT: Kenneth Davila ? ?DAY OF SURGERY: 02/19/22 ?  ?PRE-OPERATIVE DIAGNOSIS:  Lumbar stenosis with neurogenic claudication, foraminal stenosis with lumbar radiculopathy ?  ?POST-OPERATIVE DIAGNOSIS:  Same ?  ?PROCEDURE:  L4-L5, L5-S1 minimally invasive laminectomies, L4-L5, L5-S1 MIS transforaminal lumbar interbody fusion  ?  ?SURGEON:  Surgeon(s) and Role: ?   Judith Part, MD - Primary ?   Kristeen Miss, MD - Assisting ?  ?ANESTHESIA: ETGA ?  ?BRIEF HISTORY: This is a 66 year old man who presented with claudication pattern pain with progressive foot weakness as well as some sexual dysfunction with urinary overflow incontinence. The patient was found to have progressive multi-level disease, worst at L4-5 and L5-S1 with severe foraminal and canal stenosis. Given the weakness, I recommended surgery in the form of L4-5/L5-S1 decompression and TLIF in a minimally invasive technique. This was discussed with the patient as well as risks, benefits, and alternatives and wished to proceed with surgery. ?  ?OPERATIVE DETAIL:  The patient was taken to the operating room and placed on the OR table in the prone position. A formal time out was performed with two patient identifiers and confirmed the operative site. Anesthesia was induced by the anesthesia team. Nursing was unable to place a foley catheter without resistance, so I attempted and was unable to pass a catheter as well. Urology was consulted and saw a stricture, they were able to place a foley over a guide wire using a scope. The procedure was then resumed as planned. ? ?The operative site was marked, hair was clipped with surgical clippers, the area was then prepped and draped in a sterile fashion.  ? ?Fluoroscopy was used to localize the surgical level. The pedicles were marked and used to create skin incisions bilaterally. Of note, given the patient's habitus, I had to make an incision and then use a MetRx dilator wand to localize, as we could not get  the entire field of view of my instrument and the patient in the same shot. The entire case was significantly more difficult and time consuming due to habitus. For example, the towers were buried at the level of the skin incision into the adipose tissue, due to such a deep working depth.  ? ?With fluoro guidance, Jamshidi needles were used to guide K-wires into the bilateral L4, L5, and S1 pedicles. The K wires were then secured with hemostats and attention turned to docking. ? ?A MetRx tube was then docked to the left L4-5 facet through the same incision using fluoroscopy. A left L4-5 facetectomy was performed and the L4 nerve root was decompressed along its entire course. The tube was wanded medially and the decompression was continued medially until reaching the contralateral foramen. These bone fragments were saved and morselized to be used as autograft. The tube was wanded back to the disc space. The disc space was identified, incised, and a discectomy was performed in the standard fashion. The endplates were prepped, bone graft was packed into the disc space, and an expandable cage ([employer]tronic) was packed with BMP and autograft and placed into the disc space with fluoroscopic confirmation. The tube was removed and hemostasis was obtained during its removal.  ? ?The tube was then docked to the L5-S1 facet ipsilaterally. The above technique was then repeated to perform a complete facetectomy to decompress the exiting root, the tube was wanded medially to allow for completion of a laminectomy and contralateral foraminotomy. Again, the bone fragments were harvested and morselized for use as autograft.  The tube was wanded back to the disc space and the traversing and exiting roots were protected for disc work. A complete discectomy was performed at L5-S1, the endplates were prepped, autograft was packed into the disc space followed by a titanium interbody device packed with autograft and BMP, followed by more  packing of bone. The roots and thecal sac were inspected. Fluoroscopic shots were used as needed during this portion of the procedure to help with guidance. The tube was then removed and hemostasis was obtained using the operating microscope during its removal. ? ?Attention was then turned to screw placement. Using the previously placed K wires, a tap and then screw with tower were placed bilaterally at L4, L5, and S1. Given the depth and difficulty placing a rod, Dr. Ellene Route scrubbed in to help retract and locate areas that were holding up the rod. Additionally, with the patient's size, the standard sextant for measuring rod length under-sized the rods bilaterally, so these were upsized and replaced. The rod was visually confirmed in the towers, confirmed with fluoroscopy, then caps were introduced and final tightened. Hemostasis was again confirmed for both incisions, they were copiously irrigated, and then closed in layers.  ?  ?EBL:  281m ?  ?DRAINS: none ?  ?SPECIMENS: none ?  ?TJudith Part MD ?02/19/22 ?7:41 AM ? ?

## 2022-02-20 ENCOUNTER — Encounter (HOSPITAL_COMMUNITY): Payer: Self-pay | Admitting: Neurological Surgery

## 2022-02-20 MED ORDER — CEPHALEXIN 500 MG PO CAPS
500.0000 mg | ORAL_CAPSULE | Freq: Two times a day (BID) | ORAL | Status: DC
  Administered 2022-02-20 – 2022-02-24 (×9): 500 mg via ORAL
  Filled 2022-02-20 (×10): qty 1

## 2022-02-20 MED ORDER — CHLORHEXIDINE GLUCONATE CLOTH 2 % EX PADS
6.0000 | MEDICATED_PAD | Freq: Every day | CUTANEOUS | Status: DC
  Administered 2022-02-21 – 2022-03-01 (×10): 6 via TOPICAL

## 2022-02-20 NOTE — Evaluation (Signed)
Physical Therapy Evaluation Patient Details Name: Kenneth Davila MRN: [id number] DOB: [birth year] Today's Date: 02/20/2022  History of Present Illness  Pt is 66 yo male who was suffering from claudication pain with progressive foot weakness and incontinence. Presented for TLIF L4-5, L5-S1. PMH: GERD, HTN, asth,a. arthritis, anxiety and depression.  Clinical Impression  Pt admitted with above diagnosis. Pt from home where he lives alone with his dog and really wants to get back there. However, on eval, pt impulsive and with unsafe mvmt patterns breaking precautions despite PT and OT vc's. Pt also limited from a respiratory standpoint, requiring 5L O2 to maintain sats >90%. Pt reports he has O2 at home but does not use consistently and that he has a bipap but has not been using because it has been recalled. Pt needed 2 person assist for safety today and could not walk further than 6' due to fatigue and back pain. Recommending SNF at this time though pt may refuse. In which case, recommend HHPT and [facility] is possible.  Pt currently with functional limitations due to the deficits listed below (see PT Problem List). Pt will benefit from skilled PT to increase their independence and safety with mobility to allow discharge to the venue listed below.          Recommendations for follow up therapy are one component of a multi-disciplinary discharge planning process, led by the attending physician.  Recommendations may be updated based on patient status, additional functional criteria and insurance authorization.  Follow Up Recommendations Skilled nursing-short term rehab (<3 hours/day)    Assistance Recommended at Discharge Frequent or constant Supervision/Assistance  Patient can return home with the following  Two people to help with walking and/or transfers;Two people to help with bathing/dressing/bathroom;Assistance with cooking/housework;Assist for transportation;Help with stairs or ramp for entrance     Equipment Recommendations None recommended by PT  Recommendations for Other Services       Functional Status Assessment Patient has had a recent decline in their functional status and demonstrates the ability to make significant improvements in function in a reasonable and predictable amount of time.     Precautions / Restrictions Precautions Precautions: Back Precaution Booklet Issued: Yes (comment) Precaution Comments: reviewed precautions and use of DME and proper posture. Pt needs review again Restrictions Weight Bearing Restrictions: No      Mobility  Bed Mobility Overal bed mobility: Needs Assistance Bed Mobility: Rolling, Sidelying to Sit Rolling: Mod assist Sidelying to sit: Mod assist, +2 for safety/equipment, HOB elevated (20 degrees)       General bed mobility comments: multimodal cues to keep hips and shoulders in alignment, assit for trunk elevation from sidelying position, HOB at 20 degrees    Transfers Overall transfer level: Needs assistance Equipment used: Rolling walker (2 wheels) (WIDE RW) Transfers: Sit to/from Stand, Bed to chair/wheelchair/BSC Sit to Stand: Mod assist, +2 safety/equipment, From elevated surface (bed elevated)   Step pivot transfers: Min assist, +2 safety/equipment       General transfer comment: Pt with NO safety awareness with RW despite education from PT about importance of RW for fall prevention, posture, and pain relief. required multiple cues. impulsive, attempting to bend over and lean on counter surface of room (this is what he did pre-op - so it is partially a bad habit he has developed and now needs to break)    Ambulation/Gait Ambulation/Gait assistance: Mod assist, +2 safety/equipment Gait Distance (Feet): 6 Feet Assistive device: Rolling walker (2 wheels) Gait Pattern/deviations: Step-through pattern,  Trunk flexed, Wide base of support Gait velocity: decreased Gait velocity interpretation: <1.31 ft/sec, indicative of  household ambulator   General Gait Details: pt fatigues quickly and with increased WOB, stops suddenly at 6' and tries to lean down on counter in room. Reminded of "no bending" precautions, chair brought up behind pt for him to sit  Stairs            Wheelchair Mobility    Modified Rankin (Stroke Patients Only)       Balance Overall balance assessment: Needs assistance Sitting-balance support: Single extremity supported, Feet supported Sitting balance-Leahy Scale: Fair Sitting balance - Comments: initially requiring assist, able to quickly progress to min guard EOB   Standing balance support: Bilateral upper extremity supported, Reliant on assistive device for balance Standing balance-Leahy Scale: Poor Standing balance comment: dependent on external assist for balance                             Pertinent Vitals/Pain Pain Assessment Pain Assessment: 0-10 Pain Score: 8  Pain Location: lower back Pain Descriptors / Indicators: Sore Pain Intervention(s): Limited activity within patient's tolerance, Monitored during session    Home Living Family/patient expects to be discharged to:: Private residence Living Arrangements: Alone Available Help at Discharge: Neighbor Type of Home: House Home Access: Ramped entrance       Home Layout: One level Home Equipment: Conservation officer, nature (2 wheels);BSC/3in1;Wheelchair - manual;Shower seat;Cane - single point;Grab bars - tub/shower;Toilet riser Additional Comments: has a dog, Abby, jack russell terrier, that he loves    Prior Function Prior Level of Function : Independent/Modified Independent;Working/employed;Driving             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 Hip flex 3-/5, knee ext 3/5, df 3+/5 RLE Sensation: history of peripheral neuropathy RLE Coordination: decreased gross motor LLE Deficits / Details: pt relays more pain LLE, hip flex 3/5, otherwise same as RLE LLE Sensation: history of peripheral neuropathy LLE Coordination: decreased gross motor    Cervical / Trunk Assessment Cervical / Trunk Assessment: Back Surgery  Communication   Communication: No difficulties  Cognition Arousal/Alertness: Awake/alert Behavior During Therapy: WFL for tasks assessed/performed, Impulsive Overall Cognitive Status: Impaired/Different from baseline Area of Impairment: Safety/judgement, Memory, Awareness, Problem solving                     Memory: Decreased recall of precautions (back precautions)   Safety/Judgement: Decreased awareness of safety Awareness: Emergent Problem Solving: Requires verbal cues General Comments: Pt impulsive and needing cues for safety with RW, and to maintain back precautions. Pt has developed habits with dealing with back pain, and he automatically goes to these habits (bending over and leaning on furniture) instead of safe options which maintain his back precautions post-op        General Comments General comments (skin integrity, edema, etc.): Pt reports that working at a group home limits him from using his oxygen and from using an AD. 5L O2 to maintain sats >90%. He reports that he wears bipap at home but it was recently recalled so he hasn't been wearing it  Exercises     Assessment/Plan    PT Assessment Patient needs continued PT services  PT Problem List Decreased strength;Decreased activity tolerance;Decreased balance;Decreased mobility;Decreased cognition;Decreased knowledge of use of DME;Decreased safety awareness;Decreased knowledge of precautions;Pain;Cardiopulmonary  status limiting activity;Impaired sensation;Obesity       PT Treatment Interventions DME instruction;Gait training;Functional mobility training;Therapeutic activities;Therapeutic exercise;Balance training;Patient/family education;Cognitive remediation;Neuromuscular re-education    PT Goals (Current goals can be found in the Care Plan section)  Acute Rehab PT Goals Patient Stated Goal: return home to his dog PT Goal Formulation: With patient Time For Goal Achievement: 03/06/22 Potential to Achieve Goals: Good    Frequency Min 5X/week     Co-evaluation PT/OT/SLP Co-Evaluation/Treatment: Yes Reason for Co-Treatment: Complexity of the patient's impairments (multi-system involvement);For patient/therapist safety PT goals addressed during session: Mobility/safety with mobility;Balance;Proper use of DME OT goals addressed during session: ADL's and self-care;Proper use of Adaptive equipment and DME       AM-PAC PT "6 Clicks" Mobility  Outcome Measure Help needed turning from your back to your side while in a flat bed without using bedrails?: A Lot Help needed moving from lying on your back to sitting on the side of a flat bed without using bedrails?: Total Help needed moving to and from a bed to a chair (including a wheelchair)?: Total Help needed standing up from a chair using your arms (e.g., wheelchair or bedside chair)?: Total Help needed to walk in hospital room?: Total Help needed climbing 3-5 steps with a railing? : Total 6 Click Score: 7    End of Session Equipment Utilized During Treatment: Gait belt Activity Tolerance: Other (comment) (limited by impulsivity) Patient left: in chair;with call bell/phone within reach;with chair alarm set Nurse Communication: Mobility status PT Visit Diagnosis: Unsteadiness on feet (R26.81);Muscle weakness (generalized) (M62.81);Difficulty in walking, not elsewhere classified (R26.2);Pain Pain - part of body:  (back)    Time: 1443-1540 PT  Time Calculation (min) (ACUTE ONLY): 32 min   Charges:   PT Evaluation $PT Eval Moderate Complexity: [place]  Pager [phone] Office [place] 02/20/2022, 11:38 AM

## 2022-02-20 NOTE — Progress Notes (Signed)
Mobility Specialist: Progress Note ? ? 02/20/22 1640  ?Mobility  ?Activity Contraindicated/medical hold  ? ?Attempted to see pt for mobility but unable to wake despite sternal rub and yelling. Pt would open his eyes and look but would close them back after he didn't hear anyone talking anymore. Consistent snoring as well, NT notified. Will f/u as able. ? ?Kenneth Davila Kenneth Davila ?Mobility Specialist ?Mobility Specialist [place]: [phone] ?Mobility Specialist [place]: [phone] ? ?

## 2022-02-20 NOTE — Progress Notes (Signed)
RT placed pt on bipap at this time. ?

## 2022-02-20 NOTE — Progress Notes (Signed)
Patient placed on V-60 BiPAP 18/6 40% FiO2, with a back of rate of 16 at this time due to patient desating on dreamstation BiPAP with no backup rate. Vitals stable. RT will continue to monitor.  ?

## 2022-02-20 NOTE — Progress Notes (Signed)
Patient placed on dreamstation BiPAP mode 14/6 with 5 Lpm of O2 bled in at this time.  ?

## 2022-02-20 NOTE — Evaluation (Signed)
Occupational Therapy Evaluation Patient Details Name: Kenneth Davila MRN: [id number] DOB: Jun 25, 1956 Today's Date: 02/20/2022   History of Present Illness Pt is 66 yo male who was suffering from claudication pain with progressive foot weakness and incontinence. Presented for TLIF L4-5, L5-S1. PMH: GERD, HTN, asth,a. arthritis, anxiety and depression.   Clinical Impression   Pt is typically mod I for ADL and mobility. 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 We need to make sure it is WIDE/BARI equipment. 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 Today Pt with limited recall of precautions, requiring MAX cues for precautions and safety with RW. Max A for LB ADL at this time, min A for UB ADL, max A for rear peri care. Pt will benefit from skilled OT in the acute setting as well as afterwards at the SNF level. Next session to focus on reinforcing back precautions, potentially introducing AE.      Recommendations for follow up therapy are one component of a multi-disciplinary discharge planning process, led by the attending physician.  Recommendations may be updated based on patient status, additional functional criteria and insurance authorization.   Follow Up Recommendations  Skilled nursing-short term rehab (<3 hours/day)    Assistance Recommended at Discharge Intermittent Supervision/Assistance  Patient can return home with the following A lot of help with walking and/or transfers;A lot of help with bathing/dressing/bathroom;Assistance with cooking/housework;Assist for transportation;Help with stairs or ramp for entrance    Functional Status Assessment  Patient has had a recent decline in their functional status and demonstrates the ability to make significant improvements in function in a reasonable and predictable amount of time.  Equipment Recommendations   (needs WIDE equipment)    Recommendations for Other Services PT consult     Precautions /  Restrictions Precautions Precautions: Back Precaution Booklet Issued: Yes (comment) Precaution Comments: reviewed Restrictions Weight Bearing Restrictions: No      Mobility Bed Mobility Overal bed mobility: Needs Assistance Bed Mobility: Rolling, Sidelying to Sit Rolling: Mod assist Sidelying to sit: Mod assist, +2 for safety/equipment, HOB elevated (20 degrees)       General bed mobility comments: multimodal cues to keep hips and shoulders in alignment, assit for trunk elevation from sidelying position, HOB at 20 degrees    Transfers Overall transfer level: Needs assistance Equipment used: Rolling walker (2 wheels) (WIDE RW) Transfers: Sit to/from Stand, Bed to chair/wheelchair/BSC Sit to Stand: Mod assist, +2 safety/equipment, From elevated surface (bed elevated)     Step pivot transfers: Min assist, +2 safety/equipment     General transfer comment: Pt with NO safety awareness with RW despite education from PT about importance of RW for fall prevention, posture, and pain relief. required multiple cues. impulsive, attempting to bend over and lean on counter surface of room (this is what he did pre-op - so it is partially a bad habit he has developed and now needs to break)      Balance Overall balance assessment: Needs assistance Sitting-balance support: Single extremity supported, Feet supported Sitting balance-Leahy Scale: Fair Sitting balance - Comments: initially requiring assist, able to quickly progress to min guard EOB   Standing balance support: Bilateral upper extremity supported, Reliant on assistive device for balance Standing balance-Leahy Scale: Poor Standing balance comment: dependent on external assist for balance                           ADL either  performed or assessed with clinical judgement   ADL Overall ADL's : Needs assistance/impaired Eating/Feeding: Set up;Sitting   Grooming: Set up;Sitting Grooming Details (indicate cue type and  reason): unable to maintain standing at this time for grooming at sink level Upper Body Bathing: Moderate assistance;Sitting Upper Body Bathing Details (indicate cue type and reason): for back Lower Body Bathing: Moderate assistance;Sitting/lateral leans Lower Body Bathing Details (indicate cue type and reason): will need AE education Upper Body Dressing : Minimal assistance;Sitting Upper Body Dressing Details (indicate cue type and reason): no brace - but watch for twisting Lower Body Dressing: Maximal assistance Lower Body Dressing Details (indicate cue type and reason): unable to perform figure 4 Toilet Transfer: Moderate assistance;+2 for safety/equipment;Ambulation;Rolling walker (2 wheels) Toilet Transfer Details (indicate cue type and reason): vc for safety with RW, mod A for initial power up from elevated bed Toileting- Clothing Manipulation and Hygiene: Maximal assistance Toileting - Clothing Manipulation Details (indicate cue type and reason): for rear peri care     Functional mobility during ADLs: Minimal assistance;+2 for safety/equipment;Moderate assistance;Cueing for safety;Cueing for sequencing;Rolling walker (2 wheels) General ADL Comments: decreased access to LB for ADL - will need education for AE     Vision Baseline Vision/History: 1 Wears glasses Patient Visual Report: No change from baseline Vision Assessment?: No apparent visual deficits     Perception     Praxis      Pertinent Vitals/Pain       Hand Dominance Right   Extremity/Trunk Assessment Upper Extremity Assessment Upper Extremity Assessment: Defer to OT evaluation   Lower Extremity Assessment Lower Extremity Assessment: RLE deficits/detail;LLE deficits/detail;Generalized weakness RLE Sensation: history of peripheral neuropathy LLE Sensation: history of peripheral neuropathy   Cervical / Trunk Assessment Cervical / Trunk Assessment: Back Surgery   Communication Communication Communication: No  difficulties   Cognition   Behavior During Therapy: WFL for tasks assessed/performed, Impulsive Overall Cognitive Status: Impaired/Different from baseline Area of Impairment: Safety/judgement, Memory, Awareness, Problem solving                     Memory: Decreased recall of precautions (back precautions)   Safety/Judgement: Decreased awareness of safety Awareness: Emergent Problem Solving: Requires verbal cues General Comments: Pt impulsive and needing cues for safety with RW, and to maintain back precautions. Pt has developed habits with dealing with back pain, and he automatically goes to these habits (bending over and leaning on furniture) instead of safe options which maintain his back precautions post-op     General Comments  Pt requiring 5L O2 to maintain SpO2 >90. Pt is supposed to wear O2 during the day at home, and he does not (he works at a group home and cannot "lug" the tank around) this also prevents him from using a RW (group home environment)    Exercises     Shoulder Instructions      Home Living Family/patient expects to be discharged to:: Private residence Living Arrangements: Alone Available Help at Discharge: Neighbor Type of Home: [place]: One level     Bathroom Shower/Tub: Teacher, early years/pre: [place]: Conservation officer, nature (2 wheels);BSC/3in1;Wheelchair - manual;Shower seat;Cane - single point;Grab bars - tub/shower;Toilet riser          Prior Functioning/Environment Prior Level of Function : Independent/Modified Independent;Working/employed;Driving             Mobility Comments: works at group home for  adults with behavioral problems          OT Problem List: Decreased range of motion;Decreased activity tolerance;Impaired balance (sitting and/or standing);Decreased cognition;Decreased safety awareness;Decreased knowledge of use of DME or AE;Decreased  knowledge of precautions;Obesity;Pain      OT Treatment/Interventions: Self-care/ADL training;DME and/or AE instruction;Therapeutic activities;Patient/family education;Balance training    OT Goals(Current goals can be found in the care plan section) Acute Rehab OT Goals Patient Stated Goal: get back to work OT Goal Formulation: With patient Time For Goal Achievement: 03/06/22 Potential to Achieve Goals: Good  OT Frequency: Min 2X/week    Co-evaluation PT/OT/SLP Co-Evaluation/Treatment: Yes Reason for Co-Treatment: For patient/therapist safety;Complexity of the patient's impairments (multi-system involvement) PT goals addressed during session: Mobility/safety with mobility;Balance OT goals addressed during session: ADL's and self-care;Proper use of Adaptive equipment and DME      AM-PAC OT "6 Clicks" Daily Activity     Outcome Measure Help from another person eating meals?: None Help from another person taking care of personal grooming?: A Little Help from another person toileting, which includes using toliet, bedpan, or urinal?: A Lot Help from another person bathing (including washing, rinsing, drying)?: A Lot Help from another person to put on and taking off regular upper body clothing?: A Little Help from another person to put on and taking off regular lower body clothing?: A Lot 6 Click Score: 16   End of Session Equipment Utilized During Treatment: Gait belt;Rolling walker (2 wheels) (WIDE) Nurse Communication: Mobility status;Precautions  Activity Tolerance: Patient tolerated treatment well Patient left: in chair;with call bell/phone within reach;with chair alarm set  OT Visit Diagnosis: Unsteadiness on feet (R26.81);Other abnormalities of gait and mobility (R26.89);Muscle weakness (generalized) (M62.81)                Time: 9323-5573 OT Time Calculation (min): 32 min Charges:  OT General Charges $OT Visit: 1 Visit OT Evaluation $OT Eval Moderate Complexity: [place] OTR/L Acute Rehabilitation Services Pager: [phone] Office: [place] 02/20/2022, 11:07 AM

## 2022-02-20 NOTE — Progress Notes (Addendum)
Neurosurgery Service ?Progress Note ? ?Subjective: No acute events overnight, incisional back pain as expected, no radicular pain, some new bilateral ulnar numbness post-op, no hand weakness  ? ?Objective: ?Vitals:  ? 02/20/22 0410 02/20/22 3546 02/20/22 0735 02/20/22 1200  ?BP: (!) 166/86  122/61 (!) 104/57  ?Pulse: 82  97 98  ?Resp: '19  20 20  '$ ?Temp:   98.5 ?F (36.9 ?C) 98.1 ?F (36.7 ?C)  ?TempSrc:      ?SpO2: 100% 98% 99% 97%  ?Weight:      ?Height:      ? ? ?Physical Exam: ?Strength 5/5 in BUE, BLE pain limited but appears diffusely 4+/5 ?SILTx4 except bilateral stocking numbness and b/l ulnar distribution numbness ? ?Assessment & Plan: ?66 y.o. man s/p 2 level MIS decompression and TLIF, recovering well. ? ?-PT/OT ?-activity as tolerated, no brace needed ?-SCDs/TEDs, SQH POD2 ?-keep foley in for 1 week per urology, do not remove prior to then  ?-1 week keflex per urology recs ? ?[place]  ?02/20/22 ?12:55 PM ? ?

## 2022-02-20 NOTE — Progress Notes (Signed)
Occupational Therapy Treatment Patient Details Name: Kenneth Davila MRN: [id number] DOB: 11-10-1956 Today's Date: 02/20/2022   History of present illness Pt is 66 yo male who was suffering from claudication pain with progressive foot weakness and incontinence. Presented for TLIF L4-5, L5-S1. PMH: GERD, HTN, asth,a. arthritis, anxiety and depression.   OT comments  Pt seen again today as he was yelling out "help!" Assisted Pt performing bed mobility - reinforcing back precautions with min A, required mod A +2 for transfer to recliner multimodal cues for safety with precautions and RW. Pt impulsive and repeated "I'm so sorry, I'm just in so much pain" Pt continues to benefit from skilled OT in the acute setting, Pt left in chair with legs elevated and Pt able to demonstrate use of call bell to get assist in the future.    Recommendations for follow up therapy are one component of a multi-disciplinary discharge planning process, led by the attending physician.  Recommendations may be updated based on patient status, additional functional criteria and insurance authorization.    Follow Up Recommendations  Skilled nursing-short term rehab (<3 hours/day)    Assistance Recommended at Discharge Intermittent Supervision/Assistance  Patient can return home with the following  A lot of help with walking and/or transfers;A lot of help with bathing/dressing/bathroom;Assistance with cooking/housework;Assist for transportation;Help with stairs or ramp for entrance   Equipment Recommendations   (defer to next venue of care - will need WIDE)    Recommendations for Other Services PT consult    Precautions / Restrictions Precautions Precautions: Back Precaution Booklet Issued: Yes (comment) Precaution Comments: reviewed precautions and use of DME and proper posture. Pt needs review again Restrictions Weight Bearing Restrictions: No       Mobility Bed Mobility Overal bed mobility: Needs  Assistance Bed Mobility: Rolling, Sidelying to Sit Rolling: Min assist Sidelying to sit: +2 for safety/equipment, HOB elevated, Min assist (20 degrees)       General bed mobility comments: multimodal cues to keep hips and shoulders in alignment, assit for trunk elevation from sidelying position, HOB at 20 degrees    Transfers Overall transfer level: Needs assistance Equipment used: Rolling walker (2 wheels) (WIDE RW) Transfers: Sit to/from Stand, Bed to chair/wheelchair/BSC Sit to Stand: Mod assist, +2 safety/equipment, From elevated surface (bed elevated)     Step pivot transfers: Min assist, +2 safety/equipment     General transfer comment: Pt continues to require max verbal cues for safety with RW and back precautions, especially during mobility and transfers     Balance Overall balance assessment: Needs assistance Sitting-balance support: Single extremity supported, Feet supported Sitting balance-Leahy Scale: Fair Sitting balance - Comments: initially requiring assist, able to quickly progress to min guard EOB   Standing balance support: Bilateral upper extremity supported, Reliant on assistive device for balance Standing balance-Leahy Scale: Poor Standing balance comment: dependent on external assist for balance                           ADL either performed or assessed with clinical judgement   ADL Overall ADL's : Needs assistance/impaired                         Toilet Transfer: Moderate assistance;+2 for safety/equipment;Ambulation;Rolling walker (2 wheels) Toilet Transfer Details (indicate cue type and reason): vc for safety with RW, mod A for initial power up from elevated bed  General ADL Comments: Pt gets anxious and impulsive    Extremity/Trunk Assessment Upper Extremity Assessment Upper Extremity Assessment: Overall WFL for tasks assessed   Lower Extremity Assessment Lower Extremity Assessment: Generalized weakness;RLE  deficits/detail;LLE deficits/detail RLE Deficits / Details: B feet hypersensitive to touch. Hip flex 3-/5, knee ext 3/5, df 3+/5 RLE Sensation: history of peripheral neuropathy RLE Coordination: decreased gross motor LLE Deficits / Details: pt relays more pain LLE, hip flex 3/5, otherwise same as RLE LLE Sensation: history of peripheral neuropathy LLE Coordination: decreased gross motor   Cervical / Trunk Assessment Cervical / Trunk Assessment: Back Surgery    Vision       Perception     Praxis      Cognition Arousal/Alertness: Awake/alert Behavior During Therapy: WFL for tasks assessed/performed, Impulsive Overall Cognitive Status: Impaired/Different from baseline Area of Impairment: Safety/judgement, Memory, Awareness, Problem solving                     Memory: Decreased recall of precautions (back precautions)   Safety/Judgement: Decreased awareness of safety Awareness: Emergent Problem Solving: Requires verbal cues General Comments: continues with impulsivity and requires MAX cues for safety with RW and back precautions        Exercises      Shoulder Instructions       General Comments no line awareness    Pertinent Vitals/ Pain       Pain Assessment Pain Assessment: 0-10 Pain Score: 10-Worst pain ever Pain Location: lower back Pain Descriptors / Indicators: Grimacing, Guarding, Restless Pain Intervention(s): Limited activity within patient's tolerance, Monitored during session, Repositioned  Home Living Family/patient expects to be discharged to:: Private residence Living Arrangements: Alone Available Help at Discharge: Neighbor Type of Home: House Home Access: Ramped entrance     Home Layout: One level     Bathroom Shower/Tub: Teacher, early years/pre: Standard     Home Equipment: Conservation officer, nature (2 wheels);BSC/3in1;Wheelchair - manual;Shower seat;Cane - single point;Grab bars - tub/shower;Toilet riser   Additional Comments:  has a dog, Abby, jack russell terrier, that he loves      Prior Functioning/Environment              Frequency  Min 2X/week        Progress Toward Goals  OT Goals(current goals can now be found in the care plan section)  Progress towards OT goals: Progressing toward goals  Acute Rehab OT Goals Patient Stated Goal: get comfortable OT Goal Formulation: With patient Time For Goal Achievement: 03/06/22 Potential to Achieve Goals: Good ADL Goals Pt Will Perform Grooming: with modified independence;sitting Pt Will Perform Upper Body Dressing: with modified independence;sitting Pt Will Perform Lower Body Dressing: with supervision;with adaptive equipment;sit to/from stand Pt Will Transfer to Toilet: with modified independence;ambulating Pt Will Perform Toileting - Clothing Manipulation and hygiene: with modified independence;with adaptive equipment;sit to/from stand Additional ADL Goal #1: Pt will perform bed mobility at supervision level maintaining back precautions prior to engaging in ADL  Plan Discharge plan remains appropriate;Frequency remains appropriate    Co-evaluation    PT/OT/SLP Co-Evaluation/Treatment: Yes Reason for Co-Treatment: Complexity of the patient's impairments (multi-system involvement);Necessary to address cognition/behavior during functional activity;For patient/therapist safety;To address functional/ADL transfers PT goals addressed during session: Mobility/safety with mobility;Balance;Proper use of DME OT goals addressed during session: ADL's and self-care;Proper use of Adaptive equipment and DME      AM-PAC OT "6 Clicks" Daily Activity     Outcome Measure   Help from another  person eating meals?: None Help from another person taking care of personal grooming?: A Little Help from another person toileting, which includes using toliet, bedpan, or urinal?: A Lot Help from another person bathing (including washing, rinsing, drying)?: A Lot Help from  another person to put on and taking off regular upper body clothing?: A Little Help from another person to put on and taking off regular lower body clothing?: A Lot 6 Click Score: 16    End of Session Equipment Utilized During Treatment: Rolling walker (2 wheels);Oxygen (5L)  OT Visit Diagnosis: Unsteadiness on feet (R26.81);Other abnormalities of gait and mobility (R26.89);Muscle weakness (generalized) (M62.81)   Activity Tolerance Patient tolerated treatment well   Patient Left in chair;with call bell/phone within reach;with chair alarm set   Nurse Communication Mobility status;Precautions        Time: 3382-5053 OT Time Calculation (min): 14 min  Charges: OT General Charges $OT Visit: 1 Visit OT Treatments $Therapeutic Activity: 8-22 mins  Jesse Sans OTR/L Acute Rehabilitation Services Pager: [phone] Office: [place] 02/20/2022, 1:43 PM

## 2022-02-20 NOTE — Anesthesia Postprocedure Evaluation (Signed)
Anesthesia Post Note ? ?Patient: Kenneth Davila ? ?Procedure(s) Performed: Lumbar four-five, Lumbar five-Sacral one Minimally Invasive Laminectomies and Transforaminal Lumbar Interbody Fusion with Metrx (Back) ?BEDSIDE CYSTOSCOPY for foley catheter placement  (Penis) ? ?  ? ?Patient location during evaluation: PACU ?Anesthesia Type: General ?Level of consciousness: awake and alert ?Pain management: pain level controlled ?Vital Signs Assessment: post-procedure vital signs reviewed and stable ?Respiratory status: spontaneous breathing, nonlabored ventilation, respiratory function stable and patient connected to nasal cannula oxygen ?Cardiovascular status: blood pressure returned to baseline and stable ?Postop Assessment: no apparent nausea or vomiting ?Anesthetic complications: no ? ? ?No notable events documented. ? ?Last Vitals:  ?Vitals:  ? 02/20/22 0410 02/20/22 0611  ?BP: (!) 166/86   ?Pulse: 82   ?Resp: 19   ?Temp:    ?SpO2: 100% 98%  ?  ?Last Pain:  ?Vitals:  ? 02/20/22 0611  ?TempSrc:   ?PainSc: 9   ? ? ?  ?  ?  ?  ?  ?  ? ?Audry Pili ? ? ? ? ?

## 2022-02-21 LAB — GLUCOSE, CAPILLARY: Glucose-Capillary: 127 mg/dL — ABNORMAL HIGH (ref 70–99)

## 2022-02-21 MED ORDER — HEPARIN SODIUM (PORCINE) 5000 UNIT/ML IJ SOLN
5000.0000 [IU] | Freq: Three times a day (TID) | INTRAMUSCULAR | Status: DC
  Administered 2022-02-21 – 2022-03-02 (×28): 5000 [IU] via SUBCUTANEOUS
  Filled 2022-02-21 (×28): qty 1

## 2022-02-21 NOTE — TOC Progression Note (Signed)
Transition of Care (TOC) - Progression Note  ? ? ?Patient Details  ?Name: Kenneth Davila ?MRN: [id number] ?Date of Birth: Jan 18, 1956 ? ?Transition of Care (TOC) CM/SW Contact  ?Angelita Ingles, RN ?Phone Number:[phone] ? ?02/21/2022, 1:27 PM ? ?Clinical Narrative:    ? ?Transition of Care (TOC) Screening Note ? ? ?Patient Details  ?Name: Kenneth Davila ?Date of Birth: 04-30-1956 ? ? ?Transition of Care (TOC) CM/SW Contact:    ?Angelita Ingles, RN ?Phone Number: ?02/21/2022, 1:27 PM ? ? ? ?Transition of Care Department [facility]) has reviewed patient and acknowledged SNF recommendation. We will continue to monitor patient advancement through interdisciplinary progression rounds. SW will follow for SNF workup. ? ? ? ? ?  ?  ? ?Expected Discharge Plan and Services ?  ?  ?  ?  ?  ?                ?  ?  ?  ?  ?  ?  ?  ?  ?  ?  ? ? ?Social Determinants of Health (SDOH) Interventions ?  ? ?Readmission Risk Interventions ?No flowsheet data found. ? ?

## 2022-02-21 NOTE — Progress Notes (Signed)
Neurosurgery Service ?Progress Note ? ?Subjective: No acute events overnight, incisional back pain as expected, no radicular pain ? ?Objective: ?Vitals:  ? 02/20/22 2313 02/21/22 0300 02/21/22 0700 02/21/22 0738  ?BP: (!) 109/59 105/62  123/80  ?Pulse: 96 98  (!) 102  ?Resp: '20 20  19  '$ ?Temp: 98.3 ?F (36.8 ?C) 99.1 ?F (37.3 ?C)  99 ?F (37.2 ?C)  ?TempSrc: Oral Axillary  Oral  ?SpO2: 95% 91% 90% 92%  ?Weight:      ?Height:      ? ? ?Physical Exam: ?Strength 5/5 in BUE, BLE pain limited but appears diffusely 4+/5 ?SILTx4 except bilateral stocking numbness and b/l ulnar distribution numbness ? ?Assessment & Plan: ?66 y.o. man s/p 2 level MIS decompression and TLIF, recovering well. ? ?-PT/OT rec SNF, likely ready for discharge tomorrow ?-activity as tolerated, no brace needed ?-SCDs/TEDs, SQH ?-keep foley in for 1 week per urology, do not remove prior to then  ?-5d keflex per urology recs ? ?Kenneth Davila  ?02/21/22 ?8:16 AM ? ?

## 2022-02-21 NOTE — Progress Notes (Signed)
Physical Therapy Treatment ?Patient Details ?Name: Kenneth Davila ?MRN: [id number] ?DOB: 1956/06/15 ?Today's Date: 02/21/2022 ? ? ?History of Present Illness Pt is 66 yo male who was suffering from claudication pain with progressive foot weakness and incontinence. Presented for TLIF L4-5, L5-S1. PMH: GERD, HTN, asth,a. arthritis, anxiety and depression. ? ?  ?PT Comments  ? ? Pt is limited by reports of back pain and lethargy. Pt often falling asleep quickly when PT cues stop and back is supported. Pt continues to require physical assistance for all aspects of mobility and remains at a high risk for falls, experiencing multiple losses of balance during session. Pt will benefit from continued acute PT services in an effort to reduce falls risk and restore independence. PT continues to recommend SNF due to instability and lack of caregiver support.   ?Recommendations for follow up therapy are one component of a multi-disciplinary discharge planning process, led by the attending physician.  Recommendations may be updated based on patient status, additional functional criteria and insurance authorization. ? ?Follow Up Recommendations ? Skilled nursing-short term rehab (<3 hours/day) ?  ?  ?Assistance Recommended at Discharge Frequent or constant Supervision/Assistance  ?Patient can return home with the following Two people to help with walking and/or transfers;Two people to help with bathing/dressing/bathroom;Assistance with cooking/housework;Assist for transportation;Help with stairs or ramp for entrance ?  ?Equipment Recommendations ? Wheelchair (measurements PT);Wheelchair cushion (measurements PT)  ?  ?Recommendations for Other Services   ? ? ?  ?Precautions / Restrictions Precautions ?Precautions: Back ?Precaution Booklet Issued: Yes (comment) ?Precaution Comments: reviewed precautions during mobility ?Restrictions ?Weight Bearing Restrictions: No  ?  ? ?Mobility ? Bed Mobility ?Overal bed mobility: Needs  Assistance ?Bed Mobility: Rolling, Sidelying to Sit ?Rolling: Min assist ?Sidelying to sit: Mod assist ?  ?  ?  ?General bed mobility comments: heavy use of railings and PT assist to elevate trunk ?  ? ?Transfers ?Overall transfer level: Needs assistance ?Equipment used: Rolling walker (2 wheels) ?Transfers: Sit to/from Stand ?Sit to Stand: Mod assist, Max assist, From elevated surface ?  ?  ?  ?  ?  ?General transfer comment: maxA from low bed, posterior loss of balance with controlled return to sitting. ModA from elevated bed surface ?  ? ?Ambulation/Gait ?Ambulation/Gait assistance: Mod assist ?Gait Distance (Feet): 5 Feet ?Assistive device: Rolling walker (2 wheels) ?Gait Pattern/deviations: Step-to pattern, Wide base of support, Knee flexed in stance - left, Knee flexed in stance - right ?Gait velocity: reduced ?Gait velocity interpretation: <1.31 ft/sec, indicative of household ambulator ?  ?General Gait Details: pt with short step-to gait, increased knee flexion during stance phase bilaterally. PT provides verbal cues to maintain RW closer to BOS and to slow cadence to allow for improved stability ? ? ?Stairs ?  ?  ?  ?  ?  ? ? ?Wheelchair Mobility ?  ? ?Modified Rankin (Stroke Patients Only) ?  ? ? ?  ?Balance Overall balance assessment: Needs assistance ?Sitting-balance support: Feet supported, Single extremity supported, Bilateral upper extremity supported ?Sitting balance-Leahy Scale: Poor ?Sitting balance - Comments: pt often utilizing UE support of bed for stability ?Postural control: Posterior lean ?Standing balance support: Bilateral upper extremity supported, Reliant on assistive device for balance ?Standing balance-Leahy Scale: Poor ?Standing balance comment: minA and BUE support of bed ?  ?  ?  ?  ?  ?  ?  ?  ?  ?  ?  ?  ? ?  ?Cognition Arousal/Alertness: Lethargic ?Behavior During Therapy: [facility]  for tasks assessed/performed ?Overall Cognitive Status: Impaired/Different from baseline ?Area of  Impairment: Safety/judgement, Awareness, Problem solving ?  ?  ?  ?  ?  ?  ?  ?  ?  ?  ?  ?  ?Safety/Judgement: Decreased awareness of safety, Decreased awareness of deficits ?Awareness: Emergent ?Problem Solving: Slow processing ?  ?  ?  ? ?  ?Exercises Other Exercises ?Other Exercises: PT provides demonstration on ankle pumps, quad sets and glute sets. Pt declines performance at this time ? ?  ?General Comments General comments (skin integrity, edema, etc.): pt on 6L [place], SpO2 in low 90s. Pt lethargic throughout session, often falling asleep within seconds after PT cues cease ?  ?  ? ?Pertinent Vitals/Pain Pain Assessment ?Pain Assessment: 0-10 ?Pain Score: 9  ?Pain Location: lower back ?Pain Descriptors / Indicators: Grimacing, Guarding, Restless ?Pain Intervention(s): Patient requesting pain meds-RN notified  ? ? ?Home Living   ?  ?  ?  ?  ?  ?  ?  ?  ?  ?   ?  ?Prior Function    ?  ?  ?   ? ?PT Goals (current goals can now be found in the care plan section) Acute Rehab PT Goals ?Patient Stated Goal: return home to his dog ?Progress towards PT goals: Progressing toward goals (minimal progress) ? ?  ?Frequency ? ? ? Min 5X/week ? ? ? ?  ?PT Plan Current plan remains appropriate  ? ? ?Co-evaluation   ?  ?  ?  ?  ? ?  ?AM-PAC PT "6 Clicks" Mobility   ?Outcome Measure ? Help needed turning from your back to your side while in a flat bed without using bedrails?: A Little ?Help needed moving from lying on your back to sitting on the side of a flat bed without using bedrails?: A Lot ?Help needed moving to and from a bed to a chair (including a wheelchair)?: A Lot ?Help needed standing up from a chair using your arms (e.g., wheelchair or bedside chair)?: A Lot ?Help needed to walk in hospital room?: Total ?Help needed climbing 3-5 steps with a railing? : Total ?6 Click Score: 11 ? ?  ?End of Session   ?Activity Tolerance: Patient limited by fatigue;Patient limited by lethargy;Patient limited by pain ?Patient left: in  chair;with call bell/phone within reach;with chair alarm set ?Nurse Communication: Mobility status ?PT Visit Diagnosis: Unsteadiness on feet (R26.81);Muscle weakness (generalized) (M62.81);Difficulty in walking, not elsewhere classified (R26.2);Pain ?Pain - part of body:  (back) ?  ? ? ?Time: 2426-8341 ?PT Time Calculation (min) (ACUTE ONLY): 21 min ? ?Charges:  $Therapeutic Activity: 8-22 mins          ?          ? ?Zenaida Niece, PT, DPT ?Acute Rehabilitation ?Pager: [phone] ?Office [phone] ? ? ? ?Zenaida Niece ?02/21/2022, 9:55 AM ? ?

## 2022-02-21 NOTE — Progress Notes (Signed)
Patient did not want to move legs, arms or body today. It took 3 people to stand and pivot to put in back in bed. He has been sleeping and lethargic all day. At one point his oxygen was in the 70's with 5 liters of nasal cannula. We sat him up more in the bed and put him on 10 liters. I called respiratory to come by and put bipap on because he is sleeping and oxygen dropped. When therapy came by they put it back down to 5 liters and it stayed above 93%. They said to call if he drops again but last night he would not wear the bi-pap machine.  ?

## 2022-02-21 NOTE — Progress Notes (Signed)
RT went into pt's room for 0400 check, pt not on bipap at this time. ?

## 2022-02-21 NOTE — TOC Progression Note (Signed)
Transition of Care (TOC) - Progression Note  ? ? ?Patient Details  ?Name: Kenneth Davila ?MRN: [id number] ?Date of Birth: 1956/11/17 ? ?Transition of Care (TOC) CM/SW Contact  ?Vinie Sill, LCSW ?Phone Number: ?02/21/2022, 4:25 PM ? ?Clinical Narrative:    ? ?Patient has no bed offers at this time. ? ?TOC will continue to follow and assist with discharge planning. ? ?Thurmond Butts, MSW, LCSW ?Clinical Social Worker ? ? ? ?Expected Discharge Plan: [place] ?Barriers to Discharge: Ship broker, Continued Medical Work up, SNF Pending bed offer (obesity) ? ?Expected Discharge Plan and Services ?Expected Discharge Plan: [place] ?In-house Referral: Clinical Social Work ?  ?  ?  ?                ?  ?  ?  ?  ?  ?  ?  ?  ?  ?  ? ? ?Social Determinants of Health (SDOH) Interventions ?  ? ?Readmission Risk Interventions ?No flowsheet data found. ? ?

## 2022-02-21 NOTE — Progress Notes (Signed)
Physical Therapy Treatment ?Patient Details ?Name: Kenneth Davila ?MRN: [id number] ?DOB: February 17, 1956 ?Today's Date: 02/21/2022 ? ? ?History of Present Illness Pt is 66 yo male who was suffering from claudication pain with progressive foot weakness and incontinence. Presented for TLIF L4-5, L5-S1. PMH: GERD, HTN, asth,a. arthritis, anxiety and depression. ? ?  ?PT Comments  ? ? PT returns to assist in transfer back to bed as pt is more lethargic and slow to initiate. Pt requiring much greater assistance, unable to stand with +2 assistance. Pt with poor LE strength and is at a high risk for falls. Pt will benefit from continued acute PT services in an effort to improve mobility quality and reduce falls risk. PT continues to strongly recommend SNF placement.   ?Recommendations for follow up therapy are one component of a multi-disciplinary discharge planning process, led by the attending physician.  Recommendations may be updated based on patient status, additional functional criteria and insurance authorization. ? ?Follow Up Recommendations ? Skilled nursing-short term rehab (<3 hours/day) ?  ?  ?Assistance Recommended at Discharge Frequent or constant Supervision/Assistance  ?Patient can return home with the following Two people to help with walking and/or transfers;Two people to help with bathing/dressing/bathroom;Assistance with cooking/housework;Assist for transportation;Help with stairs or ramp for entrance ?  ?Equipment Recommendations ? Wheelchair (measurements PT);Wheelchair cushion (measurements PT);Other (comment);Hospital bed (hoyer lift)  ?  ?Recommendations for Other Services   ? ? ?  ?Precautions / Restrictions Precautions ?Precautions: Back ?Precaution Booklet Issued: Yes (comment) ?Restrictions ?Weight Bearing Restrictions: No  ?  ? ?Mobility ? Bed Mobility ?Overal bed mobility: Needs Assistance ?Bed Mobility: Rolling, Sit to Supine ?Rolling: Max assist ?Sidelying to sit: Mod assist ?  ?Sit to supine: Mod  assist ?  ?General bed mobility comments: heavy use of railings and PT assist to elevate trunk ?  ? ?Transfers ?Overall transfer level: Needs assistance ?Equipment used: Rolling walker (2 wheels), 2 person hand held assist ?Transfers: Sit to/from Stand, Bed to chair/wheelchair/BSC ?Sit to Stand: Max assist, +2 physical assistance (unable to achieve complete hip/knee extension) ?  ?  ?Squat pivot transfers: Max assist, +2 physical assistance ?  ?  ?General transfer comment: pt with impaired strength and slowed processing resulting in increased assistance requirements for mobility ?  ? ?Ambulation/Gait ?Ambulation/Gait assistance: Mod assist ?Gait Distance (Feet): 5 Feet ?Assistive device: Rolling walker (2 wheels) ?Gait Pattern/deviations: Step-to pattern, Wide base of support, Knee flexed in stance - left, Knee flexed in stance - right ?Gait velocity: reduced ?Gait velocity interpretation: <1.31 ft/sec, indicative of household ambulator ?  ?General Gait Details: pt with short step-to gait, increased knee flexion during stance phase bilaterally. PT provides verbal cues to maintain RW closer to BOS and to slow cadence to allow for improved stability ? ? ?Stairs ?  ?  ?  ?  ?  ? ? ?Wheelchair Mobility ?  ? ?Modified Rankin (Stroke Patients Only) ?  ? ? ?  ?Balance Overall balance assessment: Needs assistance ?Sitting-balance support: Single extremity supported, Bilateral upper extremity supported, Feet supported ?Sitting balance-Leahy Scale: Poor ?Sitting balance - Comments: reliant on support of walker of armrests ?Postural control: Posterior lean ?Standing balance support: Bilateral upper extremity supported, Reliant on assistive device for balance ?Standing balance-Leahy Scale: Zero ?Standing balance comment: maxA x 2 ?  ?  ?  ?  ?  ?  ?  ?  ?  ?  ?  ?  ? ?  ?Cognition Arousal/Alertness: Awake/alert (very slowed processing) ?Behavior During Therapy:  WFL for tasks assessed/performed ?Overall Cognitive Status:  Impaired/Different from baseline ?Area of Impairment: Attention, Memory, Following commands, Safety/judgement, Awareness, Problem solving ?  ?  ?  ?  ?  ?  ?  ?  ?  ?Current Attention Level: Sustained ?Memory: Decreased recall of precautions, Decreased short-term memory ?Following Commands: Follows one step commands with increased time ?Safety/Judgement: Decreased awareness of safety, Decreased awareness of deficits ?Awareness: Emergent ?Problem Solving: Slow processing, Decreased initiation ?  ?  ?  ? ?  ?Exercises Other Exercises ?Other Exercises: PT provides demonstration on ankle pumps, quad sets and glute sets. Pt declines performance at this time ? ?  ?General Comments General comments (skin integrity, edema, etc.): VSS on 6L [place] ?  ?  ? ?Pertinent Vitals/Pain Pain Assessment ?Pain Assessment: Faces ?Pain Score: 9  ?Faces Pain Scale: Hurts whole lot ?Pain Location: LLE ?Pain Descriptors / Indicators: Moaning ?Pain Intervention(s): Monitored during session  ? ? ?Home Living   ?  ?  ?  ?  ?  ?  ?  ?  ?  ?   ?  ?Prior Function    ?  ?  ?   ? ?PT Goals (current goals can now be found in the care plan section) Acute Rehab PT Goals ?Patient Stated Goal: return home to his dog ?Progress towards PT goals: Not progressing toward goals - comment (limited by lethargy, weakness, slowed processing) ? ?  ?Frequency ? ? ? Min 5X/week ? ? ? ?  ?PT Plan Current plan remains appropriate  ? ? ?Co-evaluation   ?  ?  ?  ?  ? ?  ?AM-PAC PT "6 Clicks" Mobility   ?Outcome Measure ? Help needed turning from your back to your side while in a flat bed without using bedrails?: A Lot ?Help needed moving from lying on your back to sitting on the side of a flat bed without using bedrails?: A Lot ?Help needed moving to and from a bed to a chair (including a wheelchair)?: Total ?Help needed standing up from a chair using your arms (e.g., wheelchair or bedside chair)?: Total ?Help needed to walk in hospital room?: Total ?Help needed climbing  3-5 steps with a railing? : Total ?6 Click Score: 8 ? ?  ?End of Session Equipment Utilized During Treatment: Oxygen ?Activity Tolerance: Patient limited by lethargy ?Patient left: in bed;with call bell/phone within reach;with bed alarm set ?Nurse Communication: Mobility status;Need for lift equipment ?PT Visit Diagnosis: Unsteadiness on feet (R26.81);Muscle weakness (generalized) (M62.81);Difficulty in walking, not elsewhere classified (R26.2);Pain ?Pain - Right/Left: Left ?Pain - part of body: Leg ?  ? ? ?Time: 1445-1501 ?PT Time Calculation (min) (ACUTE ONLY): 16 min ? ?Charges:  $Therapeutic Activity: 8-22 mins          ?          ? ?Zenaida Niece, PT, DPT ?Acute Rehabilitation ?Pager: [phone] ?Office [phone] ? ? ? ?Zenaida Niece ?02/21/2022, 3:10 PM ? ?

## 2022-02-21 NOTE — Progress Notes (Signed)
2233-Patient whom was in chair was wanting to get back in bed. Due to patient stating his legs felt more weak, RN and tech tried to assist pt up along with walker. However pt was still unable to stand up. It took 3 staff and the Clarise Cruz lift to get patient to a standing position and back into the bed. Pt is able to feel RN touch but does feel numb and weak. ?

## 2022-02-21 NOTE — TOC Initial Note (Signed)
Transition of Care (TOC) - Initial/Assessment Note  ? ? ?Patient Details  ?Name: Kenneth Davila ?MRN: [id number] ?Date of Birth: 04-Sep-1956 ? ?Transition of Care (TOC) CM/SW Contact:    ?Vinie Sill, LCSW ?Phone Number: ?02/21/2022, 2:30 PM ? ?Clinical Narrative:                 ? ?CSW met with patient. CSW introduced self and explained role. CSW discussed therapy recommendation of short term rehab at [facility]. Patient states he lives home alone. Patient appeared to be in pain or uncomfortable in room chair. RN came to assist patient and once CSW return patient was relaxed but on and off sleeping. He agreed to short term rehab at [facility] and was agreeable to starting the process. Then patient dozed off again. CSW will return at another time to further discuss SNF and possible placement barriers (payor source and weight).  ? ?CSW will begin SNF search and will update if any bed offers.  ? ?TOC will continue to follow and assist with discharge planning. ? ?Thurmond Butts, MSW, LCSW ?Clinical Social Worker ? ? ? ?Expected Discharge Plan: [place] ?Barriers to Discharge: Ship broker, Continued Medical Work up, SNF Pending bed offer (obesity) ? ? ?Patient Goals and CMS Choice ?  ?  ?  ? ?Expected Discharge Plan and Services ?Expected Discharge Plan: [place] ?In-house Referral: Clinical Social Work ?  ?  ?  ?                ?  ?  ?  ?  ?  ?  ?  ?  ?  ?  ? ?Prior Living Arrangements/Services ?  ?Lives with:: Self ?Patient language and need for interpreter reviewed:: No ?       ?Need for Family Participation in Patient Care: Yes (Comment) ?Care giver support system in place?: Yes (comment) ?  ?Criminal Activity/Legal Involvement Pertinent to Current Situation/Hospitalization: No - Comment as needed ? ?Activities of Daily Living ?  ?  ? ?Permission Sought/Granted ?Permission sought to share information with : Family Supports ?Permission granted to share information with : Yes, Verbal  Permission Granted ? Share Information with NAME: Denny Mccree ? Permission granted to share info w AGENCY: SNF ? Permission granted to share info w Relationship: son ? Permission granted to share info w Contact Information: [phone] ? ?Emotional Assessment ?Appearance:: Appears stated age ?Attitude/Demeanor/Rapport: Mercy Moore ?Affect (typically observed): Pleasant, Appropriate, Accepting ?Orientation: : Oriented to Self, Oriented to Place, Oriented to  Time, Oriented to Situation ?Alcohol / Substance Use: Not Applicable ?Psych Involvement: No (comment) ? ?Admission diagnosis:  Lumbar stenosis with neurogenic claudication [M48.062] ?Patient Active Problem List  ? Diagnosis Date Noted  ? Lumbar stenosis with neurogenic claudication 02/19/2022  ? Snoring 11/26/2020  ? DVT (deep venous thrombosis) ([place]) 12/25/2019  ? Morbid obesity ([place]) 12/22/2019  ? Acute respiratory failure due to COVID-19 [facility]) 12/21/2019  ? Diabetes mellitus type 2, diet-controlled ([place]) 12/21/2019  ? Intractable chronic post-traumatic headache 01/08/2019  ? Abnormality of gait 12/11/2018  ? Metallic taste 34/19/6222  ? Status post hernia repair 02/21/2016  ? Prediabetes 02/21/2016  ? Incarcerated incisional hernia s/p lap repair w mesh 02/09/2016 02/09/2016  ? Bilateral inguinal hernia (BIH) s/p lap repair with mesh 02/09/2016 02/09/2016  ? Tobacco dependence 11/21/2015  ? Neck pain, chronic 09/19/2015  ? History of colonic polyps 09/19/2015  ? Colon polyps   ? Family history of colon cancer   ? Chronic headache  04/05/2014  ? Right knee pain 09/10/2013  ? GERD (gastroesophageal reflux disease) 09/10/2013  ? Chronic pain syndrome 09/10/2013  ? History of depression 09/10/2013  ? Ventral hernia 09/10/2013  ? ?PCP:  Nolene Ebbs, MD ?Pharmacy:   ?[facility] ([place] [place] [facility] [place] ?[place] ?[place]  [id number] ?Phone: [phone] Fax: [phone] ? ? ? ? ?Social Determinants of Health  (SDOH) Interventions ?  ? ?Readmission Risk Interventions ?No flowsheet data found. ? ? ?

## 2022-02-21 NOTE — NC FL2 (Signed)
?[place] MEDICAID FL2 LEVEL OF CARE SCREENING TOOL  ?  ? ?IDENTIFICATION  ?Patient Name: ?Kenneth Davila Birthdate: [birth year] Sex: male Admission Date (Current Location): ?02/19/2022  ?[place] and [place] Number: ? [place] ?  Facility and Address:  ?[facility] [place], [place] [address], [place], [place] [address] ?     Provider Number: ?[id number]  ?Attending Physician Name and Address:  ?Judith Part, MD ? Relative Name and Phone Number:  ?Shameek Nyquist (son) [phone] ?   ?Current Level of Care: ?Hospital Recommended Level of Care: ?[place] Prior Approval Number: ?  ? ?Date Approved/Denied: ?  PASRR Number: ?[id number] A ? ?Discharge Plan: ?SNF ?  ? ?Current Diagnoses: ?Patient Active Problem List  ? Diagnosis Date Noted  ? Lumbar stenosis with neurogenic claudication 02/19/2022  ? Snoring 11/26/2020  ? DVT (deep venous thrombosis) ([place]) 12/25/2019  ? Morbid obesity ([place]) 12/22/2019  ? Acute respiratory failure due to COVID-19 [facility]) 12/21/2019  ? Diabetes mellitus type 2, diet-controlled ([place]) 12/21/2019  ? Intractable chronic post-traumatic headache 01/08/2019  ? Abnormality of gait 12/11/2018  ? Metallic taste 76/73/4193  ? Status post hernia repair 02/21/2016  ? Prediabetes 02/21/2016  ? Incarcerated incisional hernia s/p lap repair w mesh 02/09/2016 02/09/2016  ? Bilateral inguinal hernia (BIH) s/p lap repair with mesh 02/09/2016 02/09/2016  ? Tobacco dependence 11/21/2015  ? Neck pain, chronic 09/19/2015  ? History of colonic polyps 09/19/2015  ? Colon polyps   ? Family history of colon cancer   ? Chronic headache 04/05/2014  ? Right knee pain 09/10/2013  ? GERD (gastroesophageal reflux disease) 09/10/2013  ? Chronic pain syndrome 09/10/2013  ? History of depression 09/10/2013  ? Ventral hernia 09/10/2013  ? ? ?Orientation RESPIRATION BLADDER Height & Weight   ?  ?Self, Time, Situation, Place ? Normal Indwelling catheter Weight: (!) 375 lb (170.1 kg) ?Height:  '6\' 2"'$   (188 cm)  ?BEHAVIORAL SYMPTOMS/MOOD NEUROLOGICAL BOWEL NUTRITION STATUS  ?    Continent Diet (See DC summary)  ?AMBULATORY STATUS COMMUNICATION OF NEEDS Skin   ?Extensive Assist Verbally Surgical wounds (Back incision) ?  ?  ?  ?    ?     ?     ? ? ?Personal Care Assistance Level of Assistance  ?Bathing, Feeding, Dressing Bathing Assistance: Maximum assistance ?Feeding assistance: Independent ?Dressing Assistance: Maximum assistance ?   ? ?Functional Limitations Info  ?Sight, Hearing, Speech Sight Info: Adequate ?Hearing Info: Adequate ?Speech Info: Adequate  ? ? ?SPECIAL CARE FACTORS FREQUENCY  ?PT (By licensed PT), OT (By licensed OT)   ?  ?PT Frequency: 5x week ?OT Frequency: 5x week ?  ?  ?  ?   ? ? ?Contractures Contractures Info: Not present  ? ? ?Additional Factors Info  ?Code Status, Allergies Code Status Info: Full ?Allergies Info: Amlodipine ?  ?  ?  ?   ? ?Current Medications (02/21/2022):  This is the current hospital active medication list ?Current Facility-Administered Medications  ?Medication Dose Route Frequency Provider Last Rate Last Admin  ? 0.9 %  sodium chloride infusion  250 mL Intravenous Continuous Judith Part, MD      ? acetaminophen (TYLENOL) tablet 650 mg  650 mg Oral Q4H PRN Judith Part, MD      ? Or  ? acetaminophen (TYLENOL) suppository 650 mg  650 mg Rectal Q4H PRN Judith Part, MD      ? cephALEXin (KEFLEX) capsule 500 mg  500 mg Oral Q12H  Judith Part, MD   500 mg at 02/21/22 0800  ? Chlorhexidine Gluconate Cloth 2 % PADS 6 each  6 each Topical Daily Judith Part, MD   6 each at 02/21/22 0801  ? cyclobenzaprine (FLEXERIL) tablet 10 mg  10 mg Oral TID PRN Judith Part, MD   10 mg at 02/21/22 0800  ? docusate sodium (COLACE) capsule 100 mg  100 mg Oral BID Judith Part, MD   100 mg at 02/21/22 0800  ? heparin injection 5,000 Units  5,000 Units Subcutaneous Q8H Judith Part, MD   5,000 Units at 02/21/22 1313  ? HYDROmorphone  (DILAUDID) injection 1 mg  1 mg Intravenous Q3H PRN Judith Part, MD   1 mg at 02/21/22 1106  ? ipratropium-albuterol (DUONEB) 0.5-2.5 (3) MG/3ML nebulizer solution 3 mL  3 mL Nebulization Q4H PRN Onnie Boer Q, RPH-CPP      ? losartan (COZAAR) tablet 50 mg  50 mg Oral Daily Judith Part, MD   50 mg at 02/21/22 0800  ? menthol-cetylpyridinium (CEPACOL) lozenge 3 mg  1 lozenge Oral PRN Judith Part, MD      ? Or  ? phenol (CHLORASEPTIC) mouth spray 1 spray  1 spray Mouth/Throat PRN Judith Part, MD      ? ondansetron (ZOFRAN) tablet 4 mg  4 mg Oral Q6H PRN Judith Part, MD      ? Or  ? ondansetron (ZOFRAN) injection 4 mg  4 mg Intravenous Q6H PRN Judith Part, MD      ? oxyCODONE (Oxy IR/ROXICODONE) immediate release tablet 10 mg  10 mg Oral Q4H PRN Judith Part, MD   10 mg at 02/21/22 1312  ? oxyCODONE (Oxy IR/ROXICODONE) immediate release tablet 5 mg  5 mg Oral Q4H PRN Judith Part, MD      ? polyethylene glycol (MIRALAX / GLYCOLAX) packet 17 g  17 g Oral Daily PRN Judith Part, MD      ? pregabalin (LYRICA) capsule 150 mg  150 mg Oral Daily Judith Part, MD   150 mg at 02/21/22 0800  ? sodium chloride flush (NS) 0.9 % injection 3 mL  3 mL Intravenous Q12H Judith Part, MD   3 mL at 02/21/22 0801  ? sodium chloride flush (NS) 0.9 % injection 3 mL  3 mL Intravenous PRN Judith Part, MD      ? ? ? ?Discharge Medications: ?Please see discharge summary for a list of discharge medications. ? ?Relevant Imaging Results: ? ?Relevant Lab Results: ? ? ?Additional Information ?SS# [phone] ? ?Coralee Pesa, LCSWA ? ? ? ? ?

## 2022-02-22 LAB — POCT I-STAT 7, (LYTES, BLD GAS, ICA,H+H)
Acid-Base Excess: 2 mmol/L (ref 0.0–2.0)
Bicarbonate: 31.2 mmol/L — ABNORMAL HIGH (ref 20.0–28.0)
Calcium, Ion: 1.07 mmol/L — ABNORMAL LOW (ref 1.15–1.40)
HCT: 50 % (ref 39.0–52.0)
Hemoglobin: 17 g/dL (ref 13.0–17.0)
O2 Saturation: 83 %
Patient temperature: 99.1
Potassium: 4 mmol/L (ref 3.5–5.1)
Sodium: 130 mmol/L — ABNORMAL LOW (ref 135–145)
TCO2: 33 mmol/L — ABNORMAL HIGH (ref 22–32)
pCO2 arterial: 65.4 mmHg (ref 32–48)
pH, Arterial: 7.289 — ABNORMAL LOW (ref 7.35–7.45)
pO2, Arterial: 55 mmHg — ABNORMAL LOW (ref 83–108)

## 2022-02-22 NOTE — Progress Notes (Signed)
OT Cancellation Note ? ?Patient Details ?Name: Kenneth Davila ?MRN: [id number] ?DOB: 07/28/1956 ? ? ?Cancelled Treatment:    Reason Eval/Treat Not Completed: Patient at procedure or test/ unavailable (ABG with respiratory therapy). OT will continue to follow acutely.  ? ?Merri Ray Torris House ?02/22/2022, 10:54 AM ? ?Jesse Sans OTR/L ?Acute Rehabilitation Services ?Pager: [phone] ?Office: [phone] ? ?

## 2022-02-22 NOTE — Progress Notes (Signed)
RT placed pt on BIPAP at this time. Pt tolerating settings well. RT will monitor as needed. ?

## 2022-02-22 NOTE — Progress Notes (Signed)
Pt. Removed and refused BiPap at this time.  ?

## 2022-02-22 NOTE — Progress Notes (Signed)
Physical Therapy Treatment ?Patient Details ?Name: Kenneth Davila ?MRN: [id number] ?DOB: 12-Jul-1956 ?Today's Date: 02/22/2022 ? ? ?History of Present Illness Pt is 66 yo male who was suffering from claudication pain with progressive foot weakness and incontinence. Presented for TLIF L4-5, L5-S1. PMH: GERD, HTN, asth,a. arthritis, anxiety and depression. ? ?  ?PT Comments  ? ? Pt remains limited by lethargy, with waxing and waning level of arousal during session. Pt continues to require significant physical assistance to perform all functional mobility tasks and demonstrates no recall of back precautions. Pt will benefit from continued attempts at aggressive mobilization in an effort to reduce falls risk.   ?Recommendations for follow up therapy are one component of a multi-disciplinary discharge planning process, led by the attending physician.  Recommendations may be updated based on patient status, additional functional criteria and insurance authorization. ? ?Follow Up Recommendations ? Skilled nursing-short term rehab (<3 hours/day) ?  ?  ?Assistance Recommended at Discharge Frequent or constant Supervision/Assistance  ?Patient can return home with the following Two people to help with walking and/or transfers;Two people to help with bathing/dressing/bathroom;Assistance with cooking/housework;Assist for transportation;Help with stairs or ramp for entrance ?  ?Equipment Recommendations ? Wheelchair (measurements PT);Wheelchair cushion (measurements PT);Other (comment);Hospital bed (hoyer lift)  ?  ?Recommendations for Other Services   ? ? ?  ?Precautions / Restrictions Precautions ?Precautions: Back ?Precaution Booklet Issued: Yes (comment) ?Precaution Comments: reviewed precautions during mobility, pt unable to recall ?Restrictions ?Weight Bearing Restrictions: No  ?  ? ?Mobility ? Bed Mobility ?Overal bed mobility: Needs Assistance ?Bed Mobility: Rolling, Sidelying to Sit, Sit to Sidelying ?Rolling: Mod  assist ?Sidelying to sit: Mod assist, HOB elevated ?  ?  ?Sit to sidelying: Min assist ?  ?  ? ?Transfers ?Overall transfer level: Needs assistance ?Equipment used: 1 person hand held assist ?Transfers: Sit to/from Stand ?Sit to Stand: Max assist, From elevated surface ?  ?  ?  ?  ?  ?General transfer comment: PT provides knee block and BUE support in face to face transfer attempts. MaxA to power up into standing ?  ? ?Ambulation/Gait ?  ?  ?  ?  ?  ?  ?Pre-gait activities: lateral steps toward head of bed, MaxA due to increased knee flexion ?  ? ? ?Stairs ?  ?  ?  ?  ?  ? ? ?Wheelchair Mobility ?  ? ?Modified Rankin (Stroke Patients Only) ?  ? ? ?  ?Balance Overall balance assessment: Needs assistance ?Sitting-balance support: Single extremity supported, Bilateral upper extremity supported, Feet supported ?Sitting balance-Leahy Scale: Poor ?Sitting balance - Comments: reliant on UE support of railings or minA ?  ?Standing balance support: Bilateral upper extremity supported ?Standing balance-Leahy Scale: Poor ?Standing balance comment: maxA ?  ?  ?  ?  ?  ?  ?  ?  ?  ?  ?  ?  ? ?  ?Cognition Arousal/Alertness: Lethargic ?Behavior During Therapy: [facility] for tasks assessed/performed ?Overall Cognitive Status: Impaired/Different from baseline ?Area of Impairment: Orientation, Attention, Memory, Following commands, Safety/judgement, Awareness, Problem solving ?  ?  ?  ?  ?  ?  ?  ?  ?Orientation Level: Time (pt unable to report month) ?Current Attention Level: Focused ?Memory: Decreased recall of precautions, Decreased short-term memory ?Following Commands: Follows one step commands with increased time ?Safety/Judgement: Decreased awareness of safety, Decreased awareness of deficits ?Awareness: Intellectual ?Problem Solving: Slow processing, Decreased initiation, Difficulty sequencing, Requires verbal cues, Requires tactile cues ?  ?  ?  ? ?  ?  Exercises   ? ?  ?General Comments General comments (skin integrity, edema,  etc.): VSS on 5L [place] ?  ?  ? ?Pertinent Vitals/Pain Pain Assessment ?Pain Assessment: Faces ?Faces Pain Scale: Hurts even more ?Pain Location: whole body ?Pain Descriptors / Indicators: Aching ?Pain Intervention(s): Monitored during session  ? ? ?Home Living   ?  ?  ?  ?  ?  ?  ?  ?  ?  ?   ?  ?Prior Function    ?  ?  ?   ? ?PT Goals (current goals can now be found in the care plan section) Acute Rehab PT Goals ?Patient Stated Goal: return home to his dog ?Progress towards PT goals: Not progressing toward goals - comment (limited bylethargy and AMS) ? ?  ?Frequency ? ? ? Min 5X/week ? ? ? ?  ?PT Plan Current plan remains appropriate  ? ? ?Co-evaluation   ?  ?  ?  ?  ? ?  ?AM-PAC PT "6 Clicks" Mobility   ?Outcome Measure ? Help needed turning from your back to your side while in a flat bed without using bedrails?: A Lot ?Help needed moving from lying on your back to sitting on the side of a flat bed without using bedrails?: A Lot ?Help needed moving to and from a bed to a chair (including a wheelchair)?: Total ?Help needed standing up from a chair using your arms (e.g., wheelchair or bedside chair)?: A Lot ?Help needed to walk in hospital room?: Total ?Help needed climbing 3-5 steps with a railing? : Total ?6 Click Score: 9 ? ?  ?End of Session Equipment Utilized During Treatment: Oxygen ?Activity Tolerance: Patient limited by lethargy ?Patient left: in bed;with call bell/phone within reach;with bed alarm set ?Nurse Communication: Mobility status;Need for lift equipment ?PT Visit Diagnosis: Unsteadiness on feet (R26.81);Muscle weakness (generalized) (M62.81);Difficulty in walking, not elsewhere classified (R26.2);Pain ?Pain - Right/Left: Left ?Pain - part of body: Leg ?  ? ? ?Time: 3295-1884 ?PT Time Calculation (min) (ACUTE ONLY): 17 min ? ?Charges:  $Therapeutic Activity: 8-22 mins          ?          ? ?Zenaida Niece, PT, DPT ?Acute Rehabilitation ?Pager: [phone] ?Office [phone] ? ? ? ?Zenaida Niece ?02/22/2022,  9:13 AM ? ?

## 2022-02-22 NOTE — TOC Progression Note (Signed)
Transition of Care (TOC) - Progression Note  ? ? ?Patient Details  ?Name: Kenneth Davila ?MRN: [id number] ?Date of Birth: 06-05-1956 ? ?Transition of Care (TOC) CM/SW Contact  ?Vinie Sill, LCSW ?Phone Number: ?02/22/2022, 11:27 AM ? ?Clinical Narrative:    ? ?Patient has no bed offers ? ?Expected Discharge Plan: [place] ?Barriers to Discharge: Ship broker, Continued Medical Work up, SNF Pending bed offer (obesity) ? ?Expected Discharge Plan and Services ?Expected Discharge Plan: [place] ?In-house Referral: Clinical Social Work ?  ?  ?  ?                ?  ?  ?  ?  ?  ?  ?  ?  ?  ?  ? ? ?Social Determinants of Health (SDOH) Interventions ?  ? ?Readmission Risk Interventions ?No flowsheet data found. ? ?

## 2022-02-22 NOTE — Progress Notes (Signed)
RT attempted ABG x2. Pt with Physical Therapy. RT will try at another time. ?
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
## 2022-02-23 ENCOUNTER — Inpatient Hospital Stay (HOSPITAL_COMMUNITY): Payer: Medicaid Other

## 2022-02-23 DIAGNOSIS — E7220 Disorder of urea cycle metabolism, unspecified: Secondary | ICD-10-CM

## 2022-02-23 DIAGNOSIS — J9602 Acute respiratory failure with hypercapnia: Principal | ICD-10-CM

## 2022-02-23 DIAGNOSIS — J9601 Acute respiratory failure with hypoxia: Principal | ICD-10-CM

## 2022-02-23 DIAGNOSIS — N179 Acute kidney failure, unspecified: Secondary | ICD-10-CM

## 2022-02-23 DIAGNOSIS — D649 Anemia, unspecified: Secondary | ICD-10-CM

## 2022-02-23 DIAGNOSIS — G894 Chronic pain syndrome: Secondary | ICD-10-CM | POA: Diagnosis not present

## 2022-02-23 DIAGNOSIS — M48062 Spinal stenosis, lumbar region with neurogenic claudication: Secondary | ICD-10-CM | POA: Diagnosis not present

## 2022-02-23 LAB — COMPREHENSIVE METABOLIC PANEL
ALT: 60 U/L — ABNORMAL HIGH (ref 0–44)
AST: 124 U/L — ABNORMAL HIGH (ref 15–41)
Albumin: 3.2 g/dL — ABNORMAL LOW (ref 3.5–5.0)
Alkaline Phosphatase: 60 U/L (ref 38–126)
Anion gap: 8 (ref 5–15)
BUN: 21 mg/dL (ref 8–23)
CO2: 30 mmol/L (ref 22–32)
Calcium: 8.7 mg/dL — ABNORMAL LOW (ref 8.9–10.3)
Chloride: 97 mmol/L — ABNORMAL LOW (ref 98–111)
Creatinine, Ser: 1.29 mg/dL — ABNORMAL HIGH (ref 0.61–1.24)
GFR, Estimated: >60 mL/min (ref >60)
Glucose, Bld: 108 mg/dL — ABNORMAL HIGH (ref 70–99)
Potassium: 4.1 mmol/L (ref 3.5–5.1)
Sodium: 135 mmol/L (ref 135–145)
Total Bilirubin: 0.6 mg/dL (ref 0.3–1.2)
Total Protein: 6.7 g/dL (ref 6.5–8.1)

## 2022-02-23 LAB — POCT I-STAT 7, (LYTES, BLD GAS, ICA,H+H)
Acid-Base Excess: 6 mmol/L — ABNORMAL HIGH (ref 0.0–2.0)
Bicarbonate: 34.6 mmol/L — ABNORMAL HIGH (ref 20.0–28.0)
Calcium, Ion: 1.2 mmol/L (ref 1.15–1.40)
HCT: 38 % — ABNORMAL LOW (ref 39.0–52.0)
Hemoglobin: 12.9 g/dL — ABNORMAL LOW (ref 13.0–17.0)
O2 Saturation: 97 %
Patient temperature: 98.7
Potassium: 4.2 mmol/L (ref 3.5–5.1)
Sodium: 135 mmol/L (ref 135–145)
TCO2: 37 mmol/L — ABNORMAL HIGH (ref 22–32)
pCO2 arterial: 69.2 mmHg (ref 32–48)
pH, Arterial: 7.307 — ABNORMAL LOW (ref 7.35–7.45)
pO2, Arterial: 108 mmHg (ref 83–108)

## 2022-02-23 LAB — BLOOD GAS, ARTERIAL
Acid-Base Excess: 7.8 mmol/L — ABNORMAL HIGH (ref 0.0–2.0)
Bicarbonate: 36.1 mmol/L — ABNORMAL HIGH (ref 20.0–28.0)
Drawn by: 44135
O2 Saturation: 98.4 %
Patient temperature: 37
pCO2 arterial: 67 mmHg (ref 32–48)
pH, Arterial: 7.34 — ABNORMAL LOW (ref 7.35–7.45)
pO2, Arterial: 111 mmHg — ABNORMAL HIGH (ref 83–108)

## 2022-02-23 LAB — CBC WITH DIFFERENTIAL/PLATELET
Abs Immature Granulocytes: 0.02 10*3/uL (ref 0.00–0.07)
Basophils Absolute: 0 10*3/uL (ref 0.0–0.1)
Basophils Relative: 0 %
Eosinophils Absolute: 0.3 10*3/uL (ref 0.0–0.5)
Eosinophils Relative: 4 %
HCT: 39.6 % (ref 39.0–52.0)
Hemoglobin: 12 g/dL — ABNORMAL LOW (ref 13.0–17.0)
Immature Granulocytes: 0 %
Lymphocytes Relative: 17 %
Lymphs Abs: 1 10*3/uL (ref 0.7–4.0)
MCH: 26.4 pg (ref 26.0–34.0)
MCHC: 30.3 g/dL (ref 30.0–36.0)
MCV: 87.2 fL (ref 80.0–100.0)
Monocytes Absolute: 0.9 10*3/uL (ref 0.1–1.0)
Monocytes Relative: 15 %
Neutro Abs: 3.7 10*3/uL (ref 1.7–7.7)
Neutrophils Relative %: 64 %
Platelets: 173 10*3/uL (ref 150–400)
RBC: 4.54 MIL/uL (ref 4.22–5.81)
RDW: 13.1 % (ref 11.5–15.5)
WBC: 6 10*3/uL (ref 4.0–10.5)
nRBC: 0.3 % — ABNORMAL HIGH (ref 0.0–0.2)

## 2022-02-23 LAB — RENAL FUNCTION PANEL
Albumin: 3.3 g/dL — ABNORMAL LOW (ref 3.5–5.0)
Anion gap: 12 (ref 5–15)
BUN: 23 mg/dL (ref 8–23)
CO2: 28 mmol/L (ref 22–32)
Calcium: 8.7 mg/dL — ABNORMAL LOW (ref 8.9–10.3)
Chloride: 94 mmol/L — ABNORMAL LOW (ref 98–111)
Creatinine, Ser: 1.3 mg/dL — ABNORMAL HIGH (ref 0.61–1.24)
GFR, Estimated: >60 mL/min (ref >60)
Glucose, Bld: 118 mg/dL — ABNORMAL HIGH (ref 70–99)
Phosphorus: 1.9 mg/dL — ABNORMAL LOW (ref 2.5–4.6)
Potassium: 4.1 mmol/L (ref 3.5–5.1)
Sodium: 134 mmol/L — ABNORMAL LOW (ref 135–145)

## 2022-02-23 LAB — URINALYSIS, ROUTINE W REFLEX MICROSCOPIC
Bilirubin Urine: NEGATIVE
Glucose, UA: NEGATIVE mg/dL
Ketones, ur: 5 mg/dL — AB
Nitrite: NEGATIVE
Protein, ur: 100 mg/dL — AB
RBC / HPF: >50 RBC/hpf — ABNORMAL HIGH (ref 0–5)
Specific Gravity, Urine: 1.02 (ref 1.005–1.030)
pH: 5 (ref 5.0–8.0)

## 2022-02-23 LAB — LACTIC ACID, PLASMA
Lactic Acid, Venous: 0.9 mmol/L (ref 0.5–1.9)
Lactic Acid, Venous: 0.8 mmol/L (ref 0.5–1.9)

## 2022-02-23 LAB — AMMONIA: Ammonia: 57 umol/L — ABNORMAL HIGH (ref 9–35)

## 2022-02-23 LAB — PROCALCITONIN: Procalcitonin: 0.43 ng/mL

## 2022-02-23 LAB — MAGNESIUM: Magnesium: 2.4 mg/dL (ref 1.7–2.4)

## 2022-02-23 LAB — TSH: TSH: 1.467 u[IU]/mL (ref 0.350–4.500)

## 2022-02-23 LAB — PHOSPHORUS: Phosphorus: 2.3 mg/dL — ABNORMAL LOW (ref 2.5–4.6)

## 2022-02-23 MED ORDER — LEVALBUTEROL HCL 0.63 MG/3ML IN NEBU
0.6300 mg | INHALATION_SOLUTION | Freq: Four times a day (QID) | RESPIRATORY_TRACT | Status: DC
Start: 2022-02-23 — End: 2022-02-24
  Administered 2022-02-23 – 2022-02-24 (×3): 0.63 mg via RESPIRATORY_TRACT
  Filled 2022-02-23 (×5): qty 3

## 2022-02-23 MED ORDER — NALOXONE HCL 0.4 MG/ML IJ SOLN
0.4000 mg | Freq: Once | INTRAMUSCULAR | Status: AC
  Administered 2022-02-23: 0.4 mg via INTRAVENOUS
  Filled 2022-02-23: qty 1

## 2022-02-23 MED ORDER — SODIUM CHLORIDE 0.9 % IV SOLN: INTRAVENOUS | Status: DC

## 2022-02-23 MED ORDER — IPRATROPIUM BROMIDE 0.02 % IN SOLN
0.5000 mg | Freq: Four times a day (QID) | RESPIRATORY_TRACT | Status: DC
  Administered 2022-02-23 – 2022-02-24 (×3): 0.5 mg via RESPIRATORY_TRACT
  Filled 2022-02-23 (×3): qty 2.5

## 2022-02-23 MED ORDER — LACTULOSE 10 GM/15ML PO SOLN
20.0000 g | Freq: Two times a day (BID) | ORAL | Status: DC
Start: 2022-02-23 — End: 2022-03-03
  Administered 2022-02-23 – 2022-03-02 (×14): 20 g via ORAL
  Filled 2022-02-23 (×14): qty 30

## 2022-02-23 MED ORDER — NALOXONE HCL 0.4 MG/ML IJ SOLN: 0.4000 mg | INTRAMUSCULAR | Status: DC | PRN

## 2022-02-23 NOTE — Progress Notes (Signed)
RT obtained arterial blood gas and sent to main lab at this time.  ?

## 2022-02-23 NOTE — Assessment & Plan Note (Addendum)
OSA/OHS  ?-Reportedly patient has.  He went 3 L of supplemental oxygen via nasal cannula at home and has been noncompliant with ?-Primary reason for TRH Consult ?-ABG Findings ?   ?Component Value Date/Time  ? PHART 7.4 02/25/2022 1345  ? PCO2ART 62 (H) 02/25/2022 1345  ? PO2ART 114 (H) 02/25/2022 1345  ? HCO3 38.4 (H) 02/25/2022 1345  ? TCO2 37 (H) 02/23/2022 1644  ? O2SAT 98 02/25/2022 1345  ?-Likely has obesity hypoventilation syndrome given history of OSA  ?-Continue with BiPAP; He wears CPAP but has an issue with it is returning ?-Start Xopenex and Atrovent scheduled and chest x-ray was obtained y ?-We will initiate guaifenesin 1200 mg p.o. twice daily ?-SpO2: 98 % ?O2 Flow Rate (L/min): 7 L/min ?FiO2 (%): 40 % ?-Continuous pulse oximetry maintain O2 saturation greater than 90% ?-Start flutter valve, incentive spirometry ?-Continue supplemental oxygen via nasal cannula and wean O2 as tolerated ?-We will need outpatient sleep study if not already have one; He wears a CPAP but states it has to be returned given an issue with it  ?-Continue to monitor respiratory status carefully and will moved to progressive care unit and since he continues to not improve pulmonary has been consulted for further evaluation and recommendations ?-Was not really wheezing on examination but if not improving will consider giving a dose of Solu-Medrol ?-Patient will need an Ambulatory Home O2 screen prior to D/C  ?-Given his Hypercarbia in the setting of Obesity Hypoventilation Syndrome that is causing thoracic restrictive disease he would benefit from a Trilogy Vent (NIV) and Appreciate Pulmonary arranging and having follow up at D/C; because he is more agitated today we will do further work-up for his respiratory failure and obtain a CTA PE protocol as well as an echocardiogram to evaluate for heart failure ?-CT scan noted and did not show any embolus but did show some active reactive nodules and hyperplasia as well as an ectatic  aortic aneurysm.  He did also have some lower lobe bronchitis and some lymphedema and his echocardiogram was normal with an EF of 60 to 65% with no regional wall motion abnormalities and normal diastolic parameters ?-Wean O2 to home 3 L if able but given that he has worsened agitation and confusion on the BiPAP and continues to try and remove it we have consulted pulmonary for further evaluation recommendations ?

## 2022-02-23 NOTE — Progress Notes (Signed)
Pt off BiPAP to eat dinner tray, placed on 5L [place] by this RN ?

## 2022-02-23 NOTE — Assessment & Plan Note (Addendum)
-  Patient was confused intermittently throughout his hospitalization and unable to properly provide subjective history yesterday but is more awake and alert yesterday but today he is little bit more agitated ?-We will need to figure out how much she smokes and if necessary placed on nicotine patch and provide smoking cessation counseling ? ?

## 2022-02-23 NOTE — Progress Notes (Signed)
Physical Therapy Treatment ?Patient Details ?Name: Kenneth Davila ?MRN: [id number] ?DOB: 04-30-1956 ?Today's Date: 02/23/2022 ? ? ?History of Present Illness Pt is 66 yo male who was suffering from claudication pain with progressive foot weakness and incontinence. Presented for TLIF L4-5, L5-S1. PMH: GERD, HTN, asth,a. arthritis, anxiety and depression. ? ?  ?PT Comments  ? ? Pt received standing in room, leaning over bed rail, with chair alarm pad alarming. Pt reports attempting to return to bed, no linens on bed at the time. Pt remains confused and is very impulsive, requiring multiple cues to stop pre-mature mobility during session. Pt remains at a high risk for falls due to LE weakness and imbalance. Pt will benefit from continued acute PT services in an effort to restore independence.   ?Recommendations for follow up therapy are one component of a multi-disciplinary discharge planning process, led by the attending physician.  Recommendations may be updated based on patient status, additional functional criteria and insurance authorization. ? ?Follow Up Recommendations ? Skilled nursing-short term rehab (<3 hours/day) ?  ?  ?Assistance Recommended at Discharge Frequent or constant Supervision/Assistance  ?Patient can return home with the following Two people to help with walking and/or transfers;Two people to help with bathing/dressing/bathroom;Assistance with cooking/housework;Assist for transportation;Help with stairs or ramp for entrance ?  ?Equipment Recommendations ? Wheelchair (measurements PT);Wheelchair cushion (measurements PT);Other (comment);Hospital bed  ?  ?Recommendations for Other Services   ? ? ?  ?Precautions / Restrictions Precautions ?Precautions: Back ?Precaution Booklet Issued: Yes (comment) ?Precaution Comments: reviewed precautions during mobility, pt unable to recall ?Restrictions ?Weight Bearing Restrictions: No  ?  ? ?Mobility ? Bed Mobility ?Overal bed mobility: Needs Assistance ?Bed  Mobility: Sit to Supine ?  ?  ?  ?Sit to supine: Min assist ?  ?General bed mobility comments: pt impulsively returns from sitting to supine, no time for PT to provides cues on log roll ?  ? ?Transfers ?Overall transfer level: Needs assistance ?Equipment used: Rolling walker (2 wheels) ?Transfers: Sit to/from Stand ?Sit to Stand: Mod assist ?  ?  ?  ?  ?  ?  ?  ? ?Ambulation/Gait ?Ambulation/Gait assistance: Min assist ?Gait Distance (Feet): 12 Feet (additional trial of 7') ?Assistive device: Rolling walker (2 wheels) ?Gait Pattern/deviations: Step-through pattern, Knees buckling ?Gait velocity: reduced ?Gait velocity interpretation: <1.8 ft/sec, indicate of risk for recurrent falls ?  ?General Gait Details: pt with mild instances of knee buckling during stance phase bilaterally, no LOB noted. PT provides verbal and tactile cues for direction ? ? ?Stairs ?  ?  ?  ?  ?  ? ? ?Wheelchair Mobility ?  ? ?Modified Rankin (Stroke Patients Only) ?  ? ? ?  ?Balance Overall balance assessment: Needs assistance ?Sitting-balance support: No upper extremity supported, Feet supported ?Sitting balance-Leahy Scale: Fair ?  ?  ?Standing balance support: Bilateral upper extremity supported, Reliant on assistive device for balance ?Standing balance-Leahy Scale: Poor ?  ?  ?  ?  ?  ?  ?  ?  ?  ?  ?  ?  ?  ? ?  ?Cognition Arousal/Alertness: Awake/alert ?Behavior During Therapy: Impulsive ?Overall Cognitive Status: Impaired/Different from baseline ?Area of Impairment: Attention, Memory, Following commands, Safety/judgement, Awareness, Problem solving ?  ?  ?  ?  ?  ?  ?  ?  ?  ?Current Attention Level: Focused ?Memory: Decreased recall of precautions, Decreased short-term memory ?Following Commands: Follows one step commands with increased time, Follows one step commands inconsistently ?  Safety/Judgement: Decreased awareness of safety, Decreased awareness of deficits ?Awareness: Intellectual ?Problem Solving: Slow processing, Decreased  initiation, Difficulty sequencing, Requires verbal cues, Requires tactile cues ?General Comments: 0/3 recall of back precautions. Pt hallucinating during session, seeing squirrels and thinking that therapist had come from the ceiling. However pleasant and cooperative throughout session with multimodal cues for safety and back precautions. ?  ?  ? ?  ?Exercises   ? ?  ?General Comments General comments (skin integrity, edema, etc.): pt on 5L [place] during session, sats stable. Pt remains confused and impulsive ?  ?  ? ?Pertinent Vitals/Pain Pain Assessment ?Pain Assessment: Faces ?Faces Pain Scale: Hurts even more ?Pain Location: whole body ?Pain Descriptors / Indicators: Aching ?Pain Intervention(s): Monitored during session  ? ? ?Home Living   ?  ?  ?  ?  ?  ?  ?  ?  ?  ?   ?  ?Prior Function    ?  ?  ?   ? ?PT Goals (current goals can now be found in the care plan section) Acute Rehab PT Goals ?Patient Stated Goal: return home to his dog ?Progress towards PT goals: Progressing toward goals (slowly, cognition limiting) ? ?  ?Frequency ? ? ? Min 5X/week ? ? ? ?  ?PT Plan Current plan remains appropriate  ? ? ?Co-evaluation PT/OT/SLP Co-Evaluation/Treatment: Yes ?Reason for Co-Treatment: Complexity of the patient's impairments (multi-system involvement);For patient/therapist safety;To address functional/ADL transfers;Necessary to address cognition/behavior during functional activity ?PT goals addressed during session: Mobility/safety with mobility;Balance;Proper use of DME;Strengthening/ROM ?OT goals addressed during session: ADL's and self-care;Proper use of Adaptive equipment and DME ?  ? ?  ?AM-PAC PT "6 Clicks" Mobility   ?Outcome Measure ? Help needed turning from your back to your side while in a flat bed without using bedrails?: A Lot ?Help needed moving from lying on your back to sitting on the side of a flat bed without using bedrails?: A Lot ?Help needed moving to and from a bed to a chair (including a  wheelchair)?: A Lot ?Help needed standing up from a chair using your arms (e.g., wheelchair or bedside chair)?: A Lot ?Help needed to walk in hospital room?: Total ?Help needed climbing 3-5 steps with a railing? : Total ?6 Click Score: 10 ? ?  ?End of Session Equipment Utilized During Treatment: Oxygen ?Activity Tolerance: Patient limited by fatigue ?Patient left: in bed;with bed alarm set;with call bell/phone within reach ?Nurse Communication: Mobility status ?PT Visit Diagnosis: Unsteadiness on feet (R26.81);Muscle weakness (generalized) (M62.81);Difficulty in walking, not elsewhere classified (R26.2);Pain ?Pain - Right/Left: Left ?Pain - part of body: Leg ?  ? ? ?Time: 6579-0383 ?PT Time Calculation (min) (ACUTE ONLY): 25 min ? ?Charges:  $Gait Training: 8-22 mins          ?          ? ?Zenaida Niece, PT, DPT ?Acute Rehabilitation ?Pager: [phone] ?Office [phone] ? ? ? ?Zenaida Niece ?02/23/2022, 10:39 AM ? ?

## 2022-02-23 NOTE — Assessment & Plan Note (Signed)
-  Home meds do not show that he is taking any PPI ?-May benefit from PPI while he is hospitalized now ? ?

## 2022-02-23 NOTE — Progress Notes (Signed)
Pt placed on BIPAP 14/8 per ABG results. MD was made aware of ABG results. ?
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
## 2022-02-23 NOTE — Assessment & Plan Note (Addendum)
-   We will place on sensitive NovoLog sign scale insulin AC ?-Blood sugars have been ranging from 108-118 on BMPs ?-Continue monitor and trend blood sugars carefully ?

## 2022-02-23 NOTE — Progress Notes (Signed)
Neurosurgery Service ?Progress Note ? ?Subjective: No acute events overnight, some mild confusion but was able to stay off BiPAP this morning without issue ? ?Objective: ?Vitals:  ? 02/22/22 1954 02/22/22 2249 02/23/22 0007 02/23/22 0319  ?BP: (!) 154/74 104/71  135/60  ?Pulse: 91 88 90 93  ?Resp: 18  16   ?Temp: 97.6 ?F (36.4 ?C) (!) 97.4 ?F (36.3 ?C)  98.4 ?F (36.9 ?C)  ?TempSrc: Axillary Axillary  Oral  ?SpO2: 97% 97% 98% 93%  ?Weight:      ?Height:      ? ? ?Physical Exam: ?Strength 5/5 in BUE, BLE pain limited but appears diffusely 4+/5 ?SILTx4 except bilateral stocking numbness and b/l ulnar distribution numbness ? ?Assessment & Plan: ?66 y.o. man s/p 2 level MIS decompression and TLIF, recovering well. ? ?-PT/OT rec SNF, discharge pending respiratory status - had acute hypercarbic respiratory failure which was due to his underlying obesity related hypoventilation and obstructive sleep apnea, now off BiPAP, improved ?-activity as tolerated, no brace needed ?-SCDs/TEDs, SQH ?-keep foley in for 1 week per urology, do not remove prior to then  ?-5d keflex per urology recs ?-hyponatremic on his istat yesterday, will repeat an RFP today to confirm and re-eval. If RFP is reassuring, okay for discharge to SNF ? ?Kenneth Davila  ?02/23/22 ?7:23 AM ? ?

## 2022-02-23 NOTE — Assessment & Plan Note (Signed)
-  Complicates overall prognosis and care ?-Estimated body mass index is 48.15 kg/m? as calculated from the following: ?  Height as of this encounter: '6\' 2"'$  (1.88 m). ?  Weight as of this encounter: 170.1 kg.  ?-Weight Loss and Dietary Counseling to be given once patient is much more awake and alert ? ?

## 2022-02-23 NOTE — TOC Progression Note (Incomplete)
Transition of Care (TOC) - Progression Note  ? ? ?Patient Details  ?Name: Kenneth Davila ?MRN: [id number] ?Date of Birth: Jan 18, 1956 ? ?Transition of Care (TOC) CM/SW Contact  ?Vinie Sill, LCSW ?Phone Number: ?02/23/2022, 4:23 PM ? ?Clinical Narrative:    ? ?CSW visit with patient at bedside. Pas ? ?Expected Discharge Plan: [employer] ?Barriers to Discharge: Ship broker, Continued Medical Work up, SNF Pending bed offer (obesity) ? ?Expected Discharge Plan and Services ?Expected Discharge Plan: [place] ?In-house Referral: Clinical Social Work ?  ?  ?  ?                ?  ?  ?  ?  ?  ?  ?  ?  ?  ?  ? ? ?Social Determinants of Health (SDOH) Interventions ?  ? ?Readmission Risk Interventions ?No flowsheet data found. ? ?

## 2022-02-23 NOTE — Assessment & Plan Note (Addendum)
-   Presented with a BUN/creatinine of 10/1.17 with unclear baseline but has now trended up to 23/1.30 and is now trending down and is now 18/1.21 and is now 14/1.15 yesterday and today is now 14/1.20 ?-Have discontinued his losartan and will avoid further nephrotoxic medications, contrast dyes, hypotension renally dose medications ?-Currently has a Foley catheter in place for the urethral stricture and is on antibiotics of cephalexin which have now completed. ?-Strict I's and O's and daily weights and will obtain a urinalysis ?-Check urine sodium and urine creatinine as well as urine osm ?-We will discontinue IV fluid hydration ?-Repeat CMP in the a.m. and repeat CMP now ?

## 2022-02-23 NOTE — Assessment & Plan Note (Addendum)
-  Ammonia level noted to be 57 and is improve to 46 yesterday but will check again given that he is more agitated and confused ?-We will start lactulose 20 g p.o. twice daily and continue for now but may need to increase ?-His last ammonia level was 25 and improved we will continue his lactulose for now ? ?

## 2022-02-23 NOTE — Assessment & Plan Note (Addendum)
-   Patient has pain control per neurosurgery however we will give him some naloxone now given that he is confused and a little somnolent; His confusion had improved but he is on the BiPAP ?-We will need to be judicious with his pain control given his agitation and confusion now and this is likely contributing to his OHS and OSA worsening ?-Had to be placed on a Precedex drip last night was now weaned off of this ? ?

## 2022-02-23 NOTE — Progress Notes (Signed)
Pt. Refusing to wear Bipap at this time.  ?

## 2022-02-23 NOTE — Progress Notes (Signed)
Mobility Specialist: Progress Note ? ? 02/23/22 1540  ?Mobility  ?Activity Refused mobility  ? ?Pt difficult to wake but was able to respond by shaking his head and some verbal cues. Pt refused mobility, no reason specified. Will f/u as able. ? ?Harrell Gave Norine Reddington ?Mobility Specialist ?Mobility Specialist [place]: [phone] ?Mobility Specialist [place]: [phone] ? ?

## 2022-02-23 NOTE — Progress Notes (Signed)
Critical Lab Value: pCO2 ?Received at: 2052 ?Result: 67  ?MD notified: MD aware, treatment in place  ?

## 2022-02-23 NOTE — Progress Notes (Signed)
Patient states he is receptive to discharge to [facility] in [place] when it is time for discharge. ?

## 2022-02-23 NOTE — Assessment & Plan Note (Addendum)
Per primary team. °

## 2022-02-23 NOTE — Assessment & Plan Note (Addendum)
-  Patient's Hgb/Hct went from 17.0/50.0 -> 12.9/38.0 -> 12.0/39.6 -> 11.8/38.4 -> 11.1/36.2 and is now 11.4/37.2 ?-Checked Anemia Panel and showed iron level of 35, U IBC 255, TIBC of 290, saturation ratios of 12%, ferritin of 302, folate level of 8.2, vitamin B12 level of 476 ?-Continue to Monitor for S/Sx of Bleeding; No overt bleeding noted ?-Repeat CBC in the AM  ?

## 2022-02-23 NOTE — Progress Notes (Signed)
Occupational Therapy Treatment ?Patient Details ?Name: Kenneth Davila ?MRN: [id number] ?DOB: 04-17-1956 ?Today's Date: 02/23/2022 ? ? ?History of present illness Pt is 66 yo male who was suffering from claudication pain with progressive foot weakness and incontinence. Presented for TLIF L4-5, L5-S1. PMH: GERD, HTN, asth,a. arthritis, anxiety and depression. ?  ?OT comments ? Pt making slow progress impacted by cognitive deficits, hallucination, decreased activity tolerance. Today Pt impulsive with no safety awareness of either back precautions or fall risk behaviors. Unable to recall any back precautions 0/3. No retention of compensatory strategies for ADL. Pt required multimodal cues for safety with transfers with WIDE RW. Pt able to engage in grooming tasks during seated rest break from in room ambulation, continues to be max A for LB dressing, +2 essential for safety. OT will continue to follow acutely - and SNF remains essential to maximize safety and independence in ADL and functional transfers.   ? ?Recommendations for follow up therapy are one component of a multi-disciplinary discharge planning process, led by the attending physician.  Recommendations may be updated based on patient status, additional functional criteria and insurance authorization. ?   ?Follow Up Recommendations ? Skilled nursing-short term rehab (<3 hours/day)  ?  ?Assistance Recommended at Discharge Intermittent Supervision/Assistance  ?Patient can return home with the following ? A lot of help with walking and/or transfers;A lot of help with bathing/dressing/bathroom;Assistance with cooking/housework;Assist for transportation;Help with stairs or ramp for entrance ?  ?Equipment Recommendations ? Other (comment) (defer to next venue, will need WIDE/BARI equipment)  ?  ?Recommendations for Other Services PT consult ? ?  ?Precautions / Restrictions Precautions ?Precautions: Back ?Precaution Booklet Issued: Yes (comment) ?Precaution Comments:  reviewed precautions during mobility, pt unable to recall ?Restrictions ?Weight Bearing Restrictions: No  ? ? ?  ? ?Mobility Bed Mobility ?Overal bed mobility: Needs Assistance ?Bed Mobility: Sit to Supine ?  ?  ?  ?Sit to supine: Min assist ?  ?General bed mobility comments: attempted to coach Pt for back precautions with bed mobility, but Pt too impulsive. min A for LE ?  ? ?Transfers ?Overall transfer level: Needs assistance ?Equipment used: Rolling walker (2 wheels) (bari) ?Transfers: Sit to/from Stand ?Sit to Stand: Mod assist, +2 safety/equipment ?  ?  ?  ?  ?  ?General transfer comment: vc for safe hand placement - Pt pulling up on RW requiring stablilization from therapy team, when in standing Pt immediatly tries to go into a resting position on elbows - Pt able to correct with multimodal cues, fatigues quickly ?  ?  ?Balance Overall balance assessment: Needs assistance ?Sitting-balance support: Single extremity supported, Bilateral upper extremity supported, Feet supported ?Sitting balance-Leahy Scale: Poor ?Sitting balance - Comments: fatigues quickly in unsupported sitting ?  ?Standing balance support: Bilateral upper extremity supported ?Standing balance-Leahy Scale: Poor ?Standing balance comment: dependent on bari walker ?  ?  ?  ?  ?  ?  ?  ?  ?  ?  ?  ?   ? ?ADL either performed or assessed with clinical judgement  ? ?ADL Overall ADL's : Needs assistance/impaired ?  ?  ?Grooming: Dance movement psychotherapist;Set up;Sitting ?  ?Upper Body Bathing: Moderate assistance;Sitting ?Upper Body Bathing Details (indicate cue type and reason): for back ?  ?  ?  ?  ?Lower Body Dressing: Maximal assistance ?Lower Body Dressing Details (indicate cue type and reason): socks ?Toilet Transfer: Moderate assistance;+2 for safety/equipment;Ambulation;Rolling walker (2 wheels) (bari RW) ?Toilet Transfer Details (indicate cue type and reason):  vc for safety with RW, mod A for initial power up from recliner x2 ?  ?  ?  ?  ?Functional  mobility during ADLs: Minimal assistance;+2 for safety/equipment ?General ADL Comments: Pt hallucinating and decreased safety awareness for all aspects of back precautions and ADL ?  ? ?Extremity/Trunk Assessment Upper Extremity Assessment ?Upper Extremity Assessment: Generalized weakness ?  ?  ?  ?  ?  ? ?Vision   ?  ?  ?Perception   ?  ?Praxis   ?  ? ?Cognition Arousal/Alertness: Awake/alert ?Behavior During Therapy: Impulsive ?Overall Cognitive Status: Impaired/Different from baseline ?Area of Impairment: Attention, Memory, Following commands, Safety/judgement, Awareness, Problem solving ?  ?  ?  ?  ?  ?  ?  ?  ?  ?Current Attention Level: Focused ?Memory: Decreased recall of precautions, Decreased short-term memory ?Following Commands: Follows one step commands with increased time, Follows one step commands inconsistently ?Safety/Judgement: Decreased awareness of safety, Decreased awareness of deficits ?Awareness: Intellectual ?Problem Solving: Slow processing, Decreased initiation, Difficulty sequencing, Requires verbal cues, Requires tactile cues ?General Comments: 0/3 recall of back precautions. Pt hallucinating during session, seeing squirrels and thinking that therapist had come from the ceiling. However pleasant and cooperative throughout session with multimodal cues for safety and back precautions. ?  ?  ?   ?Exercises   ? ?  ?Shoulder Instructions   ? ? ?  ?General Comments SpO2 >90% on 5L O2 throughout session  ? ? ?Pertinent Vitals/ Pain       Pain Assessment ?Pain Assessment: Faces ?Faces Pain Scale: Hurts even more ?Pain Location: whole body ?Pain Descriptors / Indicators: Aching ?Pain Intervention(s): Monitored during session, Repositioned ? ?Home Living   ?  ?  ?  ?  ?  ?  ?  ?  ?  ?  ?  ?  ?  ?  ?  ?  ?  ?  ? ?  ?Prior Functioning/Environment    ?  ?  ?  ?   ? ?Frequency ? Min 2X/week  ? ? ? ? ?  ?Progress Toward Goals ? ?OT Goals(current goals can now be found in the care plan section) ? Progress  towards OT goals: Progressing toward goals (but slow progress due to cognitive deficits) ? ?Acute Rehab OT Goals ?Patient Stated Goal: stop seeing things ?OT Goal Formulation: With patient ?Time For Goal Achievement: 03/06/22 ?Potential to Achieve Goals: Good  ?Plan Discharge plan remains appropriate;Frequency remains appropriate   ? ?Co-evaluation ? ? ? PT/OT/SLP Co-Evaluation/Treatment: Yes ?Reason for Co-Treatment: Necessary to address cognition/behavior during functional activity;For patient/therapist safety;To address functional/ADL transfers ?PT goals addressed during session: Mobility/safety with mobility;Balance;Proper use of DME ?OT goals addressed during session: ADL's and self-care;Proper use of Adaptive equipment and DME ?  ? ?  ?AM-PAC OT "6 Clicks" Daily Activity     ?Outcome Measure ? ? Help from another person eating meals?: None ?Help from another person taking care of personal grooming?: A Little ?Help from another person toileting, which includes using toliet, bedpan, or urinal?: A Lot ?Help from another person bathing (including washing, rinsing, drying)?: A Lot ?Help from another person to put on and taking off regular upper body clothing?: A Little ?Help from another person to put on and taking off regular lower body clothing?: A Lot ?6 Click Score: 16 ? ?  ?End of Session Equipment Utilized During Treatment: Rolling walker (2 wheels);Oxygen (5L, WIDE RW) ? ?  ?  ?Activity Tolerance Other (comment);Patient tolerated treatment well (  treatment impacted by cognition) ?  ?Patient Left in bed;with call bell/phone within reach;with bed alarm set;with SCD's reapplied ?  ?Nurse Communication Mobility status;Precautions (hallucinating) ?  ? ?   ? ?Time: 3810-1751 ?OT Time Calculation (min): 20 min ? ?Charges: OT General Charges ?$OT Visit: 1 Visit ?OT Treatments ?$Self Care/Home Management : 8-22 mins ? ?Jesse Sans OTR/L ?Acute Rehabilitation Services ?Pager: [phone] ?Office: [phone] ? ?Merri Ray  Calix Heinbaugh ?02/23/2022, 8:56 AM ?

## 2022-02-24 ENCOUNTER — Telehealth: Payer: Self-pay | Admitting: Acute Care

## 2022-02-24 DIAGNOSIS — G894 Chronic pain syndrome: Secondary | ICD-10-CM | POA: Diagnosis not present

## 2022-02-24 DIAGNOSIS — R7989 Other specified abnormal findings of blood chemistry: Secondary | ICD-10-CM

## 2022-02-24 DIAGNOSIS — J9601 Acute respiratory failure with hypoxia: Secondary | ICD-10-CM | POA: Diagnosis not present

## 2022-02-24 DIAGNOSIS — N179 Acute kidney failure, unspecified: Secondary | ICD-10-CM | POA: Diagnosis not present

## 2022-02-24 DIAGNOSIS — E662 Morbid (severe) obesity with alveolar hypoventilation: Secondary | ICD-10-CM

## 2022-02-24 LAB — CBC WITH DIFFERENTIAL/PLATELET
Abs Immature Granulocytes: 0.02 10*3/uL (ref 0.00–0.07)
Basophils Absolute: 0 10*3/uL (ref 0.0–0.1)
Basophils Relative: 1 %
Eosinophils Absolute: 0.2 10*3/uL (ref 0.0–0.5)
Eosinophils Relative: 3 %
HCT: 38.4 % — ABNORMAL LOW (ref 39.0–52.0)
Hemoglobin: 11.8 g/dL — ABNORMAL LOW (ref 13.0–17.0)
Immature Granulocytes: 0 %
Lymphocytes Relative: 18 %
Lymphs Abs: 1.1 10*3/uL (ref 0.7–4.0)
MCH: 27 pg (ref 26.0–34.0)
MCHC: 30.7 g/dL (ref 30.0–36.0)
MCV: 87.9 fL (ref 80.0–100.0)
Monocytes Absolute: 0.8 10*3/uL (ref 0.1–1.0)
Monocytes Relative: 14 %
Neutro Abs: 3.8 10*3/uL (ref 1.7–7.7)
Neutrophils Relative %: 64 %
Platelets: 170 10*3/uL (ref 150–400)
RBC: 4.37 MIL/uL (ref 4.22–5.81)
RDW: 13 % (ref 11.5–15.5)
WBC: 5.9 10*3/uL (ref 4.0–10.5)
nRBC: 0 % (ref 0.0–0.2)

## 2022-02-24 LAB — MAGNESIUM: Magnesium: 2.4 mg/dL (ref 1.7–2.4)

## 2022-02-24 LAB — RETICULOCYTES
Immature Retic Fract: 22.1 % — ABNORMAL HIGH (ref 2.3–15.9)
RBC.: 4.36 MIL/uL (ref 4.22–5.81)
Retic Count, Absolute: 60.2 10*3/uL (ref 19.0–186.0)
Retic Ct Pct: 1.4 % (ref 0.4–3.1)

## 2022-02-24 LAB — PROCALCITONIN: Procalcitonin: 0.34 ng/mL

## 2022-02-24 LAB — IRON AND TIBC
Iron: 35 ug/dL — ABNORMAL LOW (ref 45–182)
Saturation Ratios: 12 % — ABNORMAL LOW (ref 17.9–39.5)
TIBC: 290 ug/dL (ref 250–450)
UIBC: 255 ug/dL

## 2022-02-24 LAB — COMPREHENSIVE METABOLIC PANEL
ALT: 55 U/L — ABNORMAL HIGH (ref 0–44)
AST: 96 U/L — ABNORMAL HIGH (ref 15–41)
Albumin: 3.1 g/dL — ABNORMAL LOW (ref 3.5–5.0)
Alkaline Phosphatase: 63 U/L (ref 38–126)
Anion gap: 6 (ref 5–15)
BUN: 18 mg/dL (ref 8–23)
CO2: 32 mmol/L (ref 22–32)
Calcium: 8.7 mg/dL — ABNORMAL LOW (ref 8.9–10.3)
Chloride: 96 mmol/L — ABNORMAL LOW (ref 98–111)
Creatinine, Ser: 1.21 mg/dL (ref 0.61–1.24)
GFR, Estimated: >60 mL/min (ref >60)
Glucose, Bld: 114 mg/dL — ABNORMAL HIGH (ref 70–99)
Potassium: 3.9 mmol/L (ref 3.5–5.1)
Sodium: 134 mmol/L — ABNORMAL LOW (ref 135–145)
Total Bilirubin: 0.5 mg/dL (ref 0.3–1.2)
Total Protein: 6.6 g/dL (ref 6.5–8.1)

## 2022-02-24 LAB — FOLATE: Folate: 8.2 ng/mL (ref >5.9)

## 2022-02-24 LAB — FERRITIN: Ferritin: 302 ng/mL (ref 24–336)

## 2022-02-24 LAB — PHOSPHORUS: Phosphorus: 2.4 mg/dL — ABNORMAL LOW (ref 2.5–4.6)

## 2022-02-24 LAB — VITAMIN B12: Vitamin B-12: 476 pg/mL (ref 180–914)

## 2022-02-24 LAB — AMMONIA: Ammonia: 46 umol/L — ABNORMAL HIGH (ref 9–35)

## 2022-02-24 MED ORDER — LEVALBUTEROL HCL 0.63 MG/3ML IN NEBU
0.6300 mg | INHALATION_SOLUTION | Freq: Two times a day (BID) | RESPIRATORY_TRACT | Status: DC
  Administered 2022-02-25 – 2022-02-28 (×6): 0.63 mg via RESPIRATORY_TRACT
  Filled 2022-02-24 (×9): qty 3

## 2022-02-24 MED ORDER — IPRATROPIUM BROMIDE 0.02 % IN SOLN
0.5000 mg | Freq: Two times a day (BID) | RESPIRATORY_TRACT | Status: DC
  Administered 2022-02-25 – 2022-02-28 (×6): 0.5 mg via RESPIRATORY_TRACT
  Filled 2022-02-24 (×7): qty 2.5

## 2022-02-24 NOTE — Progress Notes (Signed)
Physical Therapy Treatment ?Patient Details ?Name: Kenneth Davila ?MRN: [id number] ?DOB: 18-Oct-1956 ?Today's Date: 02/24/2022 ? ? ?History of Present Illness Pt is 66 yo male who was suffering from claudication pain with progressive foot weakness and incontinence. Presented for TLIF L4-5, L5-S1. PMH: GERD, HTN, asth,a. arthritis, anxiety and depression. ? ?  ?PT Comments  ? ? Pt demonstrates improvement in activity tolerance this session, ambulating for 4 trials with +2 assistance for safety. Pt continues to remain altered with poor awareness of safety and continues to report hallucinations. Pt will benefit from continued aggressive mobilization in an effort to restore independence. Due to poor caregiver support and high falls risk PT continues to recommend SNF placement.  ?Recommendations for follow up therapy are one component of a multi-disciplinary discharge planning process, led by the attending physician.  Recommendations may be updated based on patient status, additional functional criteria and insurance authorization. ? ?Follow Up Recommendations ? Skilled nursing-short term rehab (<3 hours/day) ?  ?  ?Assistance Recommended at Discharge Frequent or constant Supervision/Assistance  ?Patient can return home with the following Two people to help with walking and/or transfers;Two people to help with bathing/dressing/bathroom;Assistance with cooking/housework;Assist for transportation;Help with stairs or ramp for entrance ?  ?Equipment Recommendations ? Wheelchair (measurements PT);Wheelchair cushion (measurements PT);Other (comment);Hospital bed  ?  ?Recommendations for Other Services   ? ? ?  ?Precautions / Restrictions Precautions ?Precautions: Back ?Precaution Booklet Issued: Yes (comment) ?Precaution Comments: pt with no recall of back precautions ?Restrictions ?Weight Bearing Restrictions: No  ?  ? ?Mobility ? Bed Mobility ?  ?  ?  ?  ?  ?  ?  ?  ?  ? ?Transfers ?Overall transfer level: Needs  assistance ?Equipment used: Rolling walker (2 wheels) ?Transfers: Sit to/from Stand ?Sit to Stand: Min assist ?  ?  ?  ?  ?  ?General transfer comment: verbal cues for hand placement and nose over toes to facilitate trunk flexion ?  ? ?Ambulation/Gait ?Ambulation/Gait assistance: Min assist ?Gait Distance (Feet): 50 Feet (additional trial of 35'. 15', 10') ?Assistive device: Rolling walker (2 wheels) ?Gait Pattern/deviations: Step-through pattern, Drifts right/left ?Gait velocity: reduced ?Gait velocity interpretation: <1.8 ft/sec, indicate of risk for recurrent falls ?  ?General Gait Details: pt with slowed step-through gait, drifts laterally bumping into 2 objets on left side. PT providing cues to maintain eyes open during 3rd and 4th gait trials as pt becoming more lethargic. ? ? ?Stairs ?  ?  ?  ?  ?  ? ? ?Wheelchair Mobility ?  ? ?Modified Rankin (Stroke Patients Only) ?  ? ? ?  ?Balance Overall balance assessment: Needs assistance ?Sitting-balance support: Single extremity supported, Bilateral upper extremity supported, Feet supported ?Sitting balance-Leahy Scale: Poor ?Sitting balance - Comments: reliant on UE support of RW ?  ?Standing balance support: Bilateral upper extremity supported, Reliant on assistive device for balance ?Standing balance-Leahy Scale: Poor ?  ?  ?  ?  ?  ?  ?  ?  ?  ?  ?  ?  ?  ? ?  ?Cognition Arousal/Alertness: Awake/alert (becomes more lethargic as session progresses) ?Behavior During Therapy: Impulsive ?Overall Cognitive Status: Impaired/Different from baseline ?Area of Impairment: Orientation, Attention, Memory, Safety/judgement, Following commands, Awareness, Problem solving ?  ?  ?  ?  ?  ?  ?  ?  ?Orientation Level: Disoriented to, Time ?Current Attention Level: Focused ?Memory: Decreased recall of precautions, Decreased short-term memory ?Following Commands: Follows one step commands with increased time ?  Safety/Judgement: Decreased awareness of safety, Decreased awareness of  deficits ?Awareness: Intellectual ?Problem Solving: Slow processing, Requires verbal cues ?  ?  ?  ? ?  ?Exercises   ? ?  ?General Comments General comments (skin integrity, edema, etc.): pt on 6L [place] during session, SpO2 stable however pt becomes more lethargic as session progresses, still arouses to verbal stimulation ?  ?  ? ?Pertinent Vitals/Pain Pain Assessment ?Pain Assessment: Faces ?Faces Pain Scale: Hurts even more ?Pain Location: feet ?Pain Descriptors / Indicators: Grimacing ?Pain Intervention(s): Monitored during session  ? ? ?Home Living   ?  ?  ?  ?  ?  ?  ?  ?  ?  ?   ?  ?Prior Function    ?  ?  ?   ? ?PT Goals (current goals can now be found in the care plan section) Acute Rehab PT Goals ?Patient Stated Goal: return home to his dog ?Progress towards PT goals: Progressing toward goals ? ?  ?Frequency ? ? ? Min 5X/week ? ? ? ?  ?PT Plan Current plan remains appropriate  ? ? ?Co-evaluation   ?  ?  ?  ?  ? ?  ?AM-PAC PT "6 Clicks" Mobility   ?Outcome Measure ?   ?Help needed moving from lying on your back to sitting on the side of a flat bed without using bedrails?: A Lot ?Help needed moving to and from a bed to a chair (including a wheelchair)?: A Little ?Help needed standing up from a chair using your arms (e.g., wheelchair or bedside chair)?: A Little ?Help needed to walk in hospital room?: A Little ?Help needed climbing 3-5 steps with a railing? : Total ?6 Click Score: 12 ? ?  ?End of Session Equipment Utilized During Treatment: Oxygen ?Activity Tolerance: Patient tolerated treatment well ?Patient left: in chair;with call bell/phone within reach;with chair alarm set ?Nurse Communication: Mobility status ?PT Visit Diagnosis: Unsteadiness on feet (R26.81);Muscle weakness (generalized) (M62.81);Difficulty in walking, not elsewhere classified (R26.2);Pain ?Pain - Right/Left: Left ?Pain - part of body: Leg ?  ? ? ?Time: 9458-5929 ?PT Time Calculation (min) (ACUTE ONLY): 34 min ? ?Charges:  $Gait Training:  23-37 mins          ?          ? ?Zenaida Niece, PT, DPT ?Acute Rehabilitation ?Pager: [phone] ?Office [phone] ? ? ? ?Zenaida Niece ?02/24/2022, 3:04 PM ? ?

## 2022-02-24 NOTE — TOC Progression Note (Addendum)
Transition of Care (TOC) - Progression Note  ? ? ?Patient Details  ?Name: Kenneth Davila ?MRN: [id number] ?Date of Birth: 1956/01/23 ? ?Transition of Care (TOC) CM/SW Contact  ?Moxon Messler Renold Don, LCSWA ?Phone Number: ?02/24/2022, 12:01 PM ? ?Clinical Narrative:    ?CSW spoke with Carollee Leitz at [facility] to see if [facility] is still able to accept pt. Kim informed CSW that [facility] is not accepting wound care at the moment but she will follow up with CSW about facility decision after reviewing on Monday. ?CSW spoke with pt son to provide update on facility, pt son is pleasant and asked to contact him with any updates about placement.  ? ? ?Expected Discharge Plan: [place] ?Barriers to Discharge: Ship broker, Continued Medical Work up, SNF Pending bed offer (obesity) ? ?Expected Discharge Plan and Services ?Expected Discharge Plan: [place] ?In-house Referral: Clinical Social Work ?  ?  ?  ?                ?  ?  ?  ?  ?  ?  ?  ?  ?  ?  ? ? ?Social Determinants of Health (SDOH) Interventions ?  ? ?Readmission Risk Interventions ?No flowsheet data found. ? ?

## 2022-02-24 NOTE — Assessment & Plan Note (Addendum)
-  Causing Thoracic Restrictive Disease ?-Would benefit from NIV and have asked Pulmonary NP to help arrange ?-Will need Pulmonary Follow up at D/C and recommend continued weight loss and dietary counseling; because he continues to need BiPAP throughout the day we will consult pulmonary in-house  ?

## 2022-02-24 NOTE — Hospital Course (Addendum)
Kenneth Davila is a 66 y.o. 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  Patient underwent this procedure and postoperatively was doing but then intermittently became confused and hypercarbic.  Had to be placed on BiPAP and was doing well overnight however today he decompensated and became extremely confused and more somnolent and had to be placed on BiPAP.  Patient is fused at this time and does not know where he is he does not answer questions appropriately.  TRH was asked to consult on this patient given his acute hypercarbic hypoxic respiratory failure as well as a slight AKI. ? 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 If he goes to SNF first he will ned BiPAP at SNF and then transition to NIV when he transitions home. PT/OT recommending SNF. ? ?02/25/2022 he was more restless and confused and has been pulling off his BiPAP. Will evaluated his Respiratory Failure further. When he was not as confused this AM patient told me that he is supposed to be wearing 3 L of supplemental oxygen via nasal cannula but does not wear it and has been noncompliant with it.  Given that he still continues to require significant oxygen and be on BiPAP will evaluate him for PE and heart failure and a D-dimer is ordered and was elevated so we will get a CTA PE protocol.  Also check an echocardiogram despite his BNP not being elevated.  Because of his agitation and his confusion we will  place him on delirium precautions and also consult critical care for his acute on chronic respiratory failure with hypercarbia and hypoxia. ? ?02/26/2022 given his significant agitation and his acute on chronic respiratory failure with hypercarbia critical care was consulted and took the patient to the ICU and placed him on a Precedex drip.  He has not been weaned off of Precedex.  Echocardiogram and CT of the chest PE were done finally: CTA of the chest showed a mildly prominent pulmonary trunk with no arterial embolus seen throughout the segmental divisions but the subsegmental arteries were unopacified and not evaluated.  There is mild cardiomegaly without venous distention or edema and trace pleural effusions.  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  Echocardiogram was done and was normal with an EF of 60 to 65% and no wall motion abnormalities and normal diastolic parameters. ? ?He has now since been weaned off of the Precedex drip and has been weaned off of BiPAP on supplemental oxygen via nasal cannula ?

## 2022-02-24 NOTE — Progress Notes (Signed)
PROGRESS NOTE    Kenneth Davila  NTI:[id number] DOB: 1956/01/04 DOA: 02/19/2022 PCP: Nolene Ebbs, MD   Brief Narrative:  Kenneth Davila is a 66 y.o. 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  Patient underwent this procedure and postoperatively was doing but then intermittently became confused and hypercarbic.  Had to be placed on BiPAP and was doing well overnight however today he decompensated and became extremely confused and more somnolent and had to be placed on BiPAP.  Patient is fused at this time and does not know where he is he does not answer questions appropriately.  TRH was asked to consult on this patient given his acute hypercarbic hypoxic respiratory failure as well as a slight AKI.  He is improved after wearing the BiPAP and likely would benefit from NIV for transition for home given his Hypercarbia and Obesity Hypoventilation Syndrome that is causing Thoracic Restrictive Disease. If he goes to SNF first he will ned BiPAP at SNF and then transition to NIV when he transitions home. PT/OT recommending SNF.    Assessment and Plan: * Acute respiratory failure with hypoxia and hypercarbia (HCC) OSA/OHS  -Primary reason for TRH Consult -ABG Findings    Component Value Date/Time   PHART 7.34 (L) 02/23/2022 2035   PCO2ART 67 ([place]) 02/23/2022 2035   PO2ART 111 (H) 02/23/2022 2035   HCO3 36.1 (H) 02/23/2022 2035   TCO2 37 (H) 02/23/2022 1644   O2SAT 98.4 02/23/2022 2035  -Likely has obesity hypoventilation syndrome given history of OSA  -Continue with BiPAP for now and adjust settings and repeat ABG  in 2 hours; He wears  -Start Xopenex and Atrovent scheduled and obtain a stat chest x-ray -We will initiate guaifenesin 1200 mg p.o. twice daily -SpO2: 98 % O2 Flow Rate (L/min): 5 L/min FiO2 (%): 40 % -Continuous pulse oximetry maintain O2 saturation greater than 90% -Start flutter valve, incentive spirometry -Continue supplemental oxygen via nasal cannula and wean O2 as tolerated -We will need outpatient sleep study if not already have one; He wears a CPAP but states it has to be returned given an issue with it  -Continue to monitor respiratory status carefully and will moved to progressive care unit and if continues to not improve or worsen will consult pulmonary for further evaluation recommendations -Was not really wheezing on examination but if not improving will consider giving a dose of Solu-Medrol -Patient will need an Ambulatory Home O2 screen prior to D/C  -Given his Hypercarbia in the setting of Obesity Hypoventilation Syndrome that is causing thoracic restrictive disease he would benefit from a Trilogy Vent (NIV) and Appreciate Pulmonary arranging and having follow up at D/C -Wean O2 to Room Air    Obesity hypoventilation syndrome ([place]) -Causing Thoracic Restrictive Disease -Would benefit from NIV and have asked Pulmonary NP to help arrange -Will need Pulmonary Follow up at D/C and recommend continued weight loss and dietary counseling   Abnormal LFTs - Unclear etiology but could be reactive and is now trending down -Patient's AST went from 124 is now 96 -ALT is trending down and went from 60 is now 35 -Continue monitor and trend carefully and if continues to be elevated  or not improving will obtain right upper quadrant ultrasound as well as an acute hepatitis panel in the morning -Repeat CMP in the a.m.  Normocytic anemia -Patient's Hgb/Hct went from 17.0/50.0 -> 12.9/38.0 -> 12.0/39.6 -> 11.8/38.4 -Checked Anemia Panel and showed iron level of 35, U IBC 255, TIBC of 290,  saturation ratios of 12%, ferritin of 302, folate level of 8.2, vitamin B12 level of 476 -Continue to Monitor for S/Sx of Bleeding; No overt bleeding noted -Repeat CBC in the AM   AKI (acute kidney injury) ([place]) - Presented with a BUN/creatinine of 10/1.17 with unclear baseline but has now trended up to 23/1.30 and is now trending down and is now 18/1.21 -Have discontinued his losartan and will avoid further nephrotoxic medications, contrast dyes, hypotension renally dose medications -Currently has a Foley catheter in place for the urethral stricture and is on antibiotics of cephalexin -Strict I's and O's and daily weights and will obtain a urinalysis -Check urine sodium and urine creatinine as well as urine osm -Started on IV fluid hydration with normal saline at 75 mils per hour by the primary team and I am in agreement with this and recommend continuing for a day or so -Repeat CMP in the a.m. and repeat CMP now  Hyperammonemia ([place]) -Ammonia level noted to be 57 and is improve to 46 -We will start lactulose 20 g p.o. twice daily and continue for now  Lumbar stenosis with neurogenic claudication -Per primary team  Morbid obesity ([place]) -Complicates overall prognosis and care -Estimated body mass index is 48.15 kg/m as calculated from the following:   Height as of this encounter: '6\' 2"'$  (1.88 m).   Weight as of this encounter: 170.1 kg.  -Weight Loss and Dietary Counseling to be given once patient is much more awake and alert   Diabetes mellitus type 2, diet-controlled ([place]) - We will place on sensitive NovoLog sign scale insulin AC -Blood sugars have been ranging from 110-118 on BMPs -Continue monitor and trend blood sugars carefully  Tobacco dependence -Patient was confused and unable to properly provide subjective history yesterday but is more awake and alert today -We will need to figure out how much she smokes and if necessary placed on nicotine patch and provide smoking  cessation counseling  Chronic pain syndrome - Patient has pain control per neurosurgery however we will give him some naloxone now given that he is confused and a little somnolent; His confusion is improved but he is on the BiPAP  GERD (gastroesophageal reflux disease) -Home meds do not show that he is taking any PPI -May benefit from PPI while he is hospitalized now   DVT prophylaxis: heparin injection 5,000 Units Start: 02/21/22 0915 SCD's Start: 02/19/22 1834    Code Status: Full Code Family Communication: No family currently at bedside   Disposition Plan:  Level of care: Progressive Status is: Inpatient Remains inpatient appropriate because: Needs Respiratory Failure to Improve    Consultants:  [facility] Discussed with Pulmonary  Procedures:   2 level MIS decompression and TLIF  Antimicrobials:  Anti-infectives (From admission, onward)    Start     Dose/Rate Route Frequency Ordered Stop   02/20/22 1345  cephALEXin (KEFLEX) capsule 500 mg        500 mg Oral Every 12 hours 02/20/22 1257 02/25/22 0959   02/19/22 2000  ceFAZolin (ANCEF) IVPB 2g/100 mL premix        2 g 200 mL/hr over 30 Minutes Intravenous Every 8 hours 02/19/22 1833  02/20/22 0425   02/19/22 0603  ceFAZolin (ANCEF) 2-4 GM/100ML-% IVPB  Status:  Discontinued       Note to Pharmacy: Alba Cory B: cabinet override      02/19/22 0603 02/19/22 0742   02/19/22 0600  ceFAZolin (ANCEF) IVPB 3g/100 mL premix        3 g 200 mL/hr over 30 Minutes Intravenous On call to O.R. 02/19/22 0554 02/19/22 1200        Subjective: Seen and examined at bedside who is much more awake and alert today than he was yesterday but still made on BiPAP.  Answering questions appropriately.  Complaining of some pain in his back.  No nausea or vomiting.  Denies any lightheadedness or dizziness.  No other concerns or complaints at this time.  Objective: Vitals:   02/24/22 0833 02/24/22 0834 02/24/22 1109 02/24/22 1500   BP:   109/64 120/74  Pulse: 88 88  94  Resp: '17 17  18  '$ Temp:   98.7 F (37.1 C) 99 F (37.2 C)  TempSrc:   Oral Oral  SpO2: 100% 100% 96% 98%  Weight:      Height:        Intake/Output Summary (Last 24 hours) at 02/24/2022 1540 Last data filed at 02/24/2022 1431 Gross per 24 hour  Intake 1607.99 ml  Output 1950 ml  Net -342.01 ml   Filed Weights   02/19/22 0603  Weight: (!) 170.1 kg    Examination: Physical Exam:  Constitutional: WN/WD morbidly obese African-American male currently in no acute distress but remains on BiPAP Respiratory: Diminished to auscultation bilaterally with coarse breath sounds, no wheezing, rales, rhonchi or crackles.  Slightly tachypneic is wearing BiPAP Cardiovascular: RRR, no murmurs / rubs / gallops. S1 and S2 auscultated.  No appreciable extremity edema Abdomen: Soft, non-tender, distended secondary body habitus. Bowel sounds positive.  GU: Deferred. Musculoskeletal: No clubbing / cyanosis of digits/nails. No joint deformity upper and lower extremities. Skin: No rashes, lesions, ulcers on limited skin evaluation  Data Reviewed: I have personally reviewed following labs and imaging studies  CBC: Recent Labs  Lab 02/22/22 1055 02/23/22 1644 02/23/22 1730 02/24/22 0351  WBC  --   --  6.0 5.9  NEUTROABS  --   --  3.7 3.8  HGB 17.0 12.9* 12.0* 11.8*  HCT 50.0 38.0* 39.6 38.4*  MCV  --   --  87.2 87.9  PLT  --   --  173 037   Basic Metabolic Panel: Recent Labs  Lab 02/22/22 1055 02/23/22 0823 02/23/22 1644 02/23/22 1730 02/24/22 0351  NA 130* 134* 135 135 134*  K 4.0 4.1 4.2 4.1 3.9  CL  --  94*  --  97* 96*  CO2  --  28  --  30 32  GLUCOSE  --  118*  --  108* 114*  BUN  --  23  --  21 18  CREATININE  --  1.30*  --  1.29* 1.21  CALCIUM  --  8.7*  --  8.7* 8.7*  MG  --   --   --  2.4 2.4  PHOS  --  1.9*  --  2.3* 2.4*   GFR: Estimated Creatinine Clearance: 103.8 mL/min (by C-G formula based on SCr of 1.21 mg/dL). Liver  Function Tests: Recent Labs  Lab 02/23/22 0823 02/23/22 1730 02/24/22 0351  AST  --  124* 96*  ALT  --  60* 55*  ALKPHOS  --  60 63  BILITOT  --  0.6 0.5  PROT  --  6.7 6.6  ALBUMIN 3.3* 3.2* 3.1*   No results for input(s): LIPASE, AMYLASE in the last 168 hours. Recent Labs  Lab 02/23/22 1638 02/24/22 1000  AMMONIA 57* 46*   Coagulation Profile: No results for input(s): INR, PROTIME in the last 168 hours. Cardiac Enzymes: No results for input(s): CKTOTAL, CKMB, CKMBINDEX, TROPONINI in the last 168 hours. BNP (last 3 results) No results for input(s): PROBNP in the last 8760 hours. HbA1C: No results for input(s): HGBA1C in the last 72 hours. CBG: Recent Labs  Lab 02/21/22 2008  GLUCAP 127*   Lipid Profile: No results for input(s): CHOL, HDL, LDLCALC, TRIG, CHOLHDL, LDLDIRECT in the last 72 hours. Thyroid Function Tests: Recent Labs    02/23/22 1737  TSH 1.467   Anemia Panel: Recent Labs    02/24/22 0351  VITAMINB12 476  FOLATE 8.2  FERRITIN 302  TIBC 290  IRON 35*  RETICCTPCT 1.4   Sepsis Labs: Recent Labs  Lab 02/23/22 1730 02/23/22 2022 02/24/22 0351  PROCALCITON 0.43  --  0.34  LATICACIDVEN 0.9 0.8  --     Recent Results (from the past 240 hour(s))  SARS Coronavirus 2 by RT PCR (hospital order, performed in [place] hospital lab) Nasopharyngeal Nasopharyngeal Swab     Status: None   Collection Time: 02/16/22 10:08 AM   Specimen: Nasopharyngeal Swab  Result Value Ref Range Status   SARS Coronavirus 2 NEGATIVE NEGATIVE Final    Comment: (NOTE) SARS-CoV-2 target nucleic acids are NOT DETECTED.  The SARS-CoV-2 RNA is generally detectable in upper and lower respiratory specimens during the acute phase of infection. The lowest concentration of SARS-CoV-2 viral copies this assay can detect is 250 copies / mL. A negative result does not preclude SARS-CoV-2 infection and should not be used as the sole basis for treatment or other patient  management decisions.  A negative result may occur with improper specimen collection / handling, submission of specimen other than nasopharyngeal swab, presence of viral mutation(s) within the areas targeted by this assay, and inadequate number of viral copies (<250 copies / mL). A negative result must be combined with clinical observations, patient history, and epidemiological information.  Fact Sheet for Patients:   StrictlyIdeas.no  Fact Sheet for Healthcare Providers: BankingDealers.co.za  This test is not yet approved or  cleared by the [place] FDA and has been authorized for detection and/or diagnosis of SARS-CoV-2 by FDA under an Emergency Use Authorization (EUA).  This EUA will remain in effect (meaning this test can be used) for the duration of the COVID-19 declaration under Section 564(b)(1) of the Act, 21 U.S.C. section 360bbb-3(b)(1), unless the authorization is terminated or revoked sooner.  Performed at [facility], [place] [address], [place], [place] [address]     Radiology Studies: DG CHEST PORT 1 VIEW  Result Date: 02/23/2022 CLINICAL DATA:  Shortness of breath. EXAM: PORTABLE CHEST 1 VIEW COMPARISON:  12/24/2019 FINDINGS: Low lung volumes are noted. Cardiomegaly is again demonstrated. No evidence of pulmonary consolidation or pleural effusion. IMPRESSION: Low lung volumes and cardiomegaly. No acute findings. Electronically Signed   By: Marlaine Hind M.D.   On: 02/23/2022 18:03    Scheduled Meds:  cephALEXin  500 mg Oral Q12H   Chlorhexidine Gluconate Cloth  6 each Topical Daily   docusate sodium  100 mg Oral BID   heparin injection (subcutaneous)  5,000 Units Subcutaneous Q8H   [START ON 02/25/2022] ipratropium  0.5 mg Nebulization  BID   lactulose  20 g Oral BID   [START ON 02/25/2022] levalbuterol  0.63 mg Nebulization BID   pregabalin  150 mg Oral Daily   sodium chloride flush  3 mL Intravenous Q12H    Continuous Infusions:  sodium chloride     sodium chloride 75 mL/hr at 02/24/22 1431    LOS: 5 days   Raiford Noble, DO [facility] Available via Epic secure chat 7am-7pm After these hours, please refer to coverage provider listed on [email or web address] 02/24/2022, 3:40 PM

## 2022-02-24 NOTE — Assessment & Plan Note (Addendum)
-   Unclear etiology but could be reactive and is now trending down ?-Patient's AST went from 124 is now 96 -> 65 and is now 3 ?-ALT is trending down and went from 60 is now 55 -> 50 and is now 47 ?-Continue monitor and trend carefully and if continues to be elevated or not improving will obtain right upper quadrant ultrasound as well as an acute hepatitis panel in the morning ?-Repeat CMP in the a.m. ?

## 2022-02-24 NOTE — Progress Notes (Signed)
Pt has taken BiPAP off multiple times although educated on importance of wearing it, states "he cant breathe"  demanding to sit on the edge of the bed, taken all of his leads off, states "this bed is breaking my back" Continued to toss back and forth even after '1mg'$  IV dilaudid he remained restless, this lead to him pulling out his IV although he was educated on the importance of having it in (pain medication, fluids, emergency, etc.) ?

## 2022-02-24 NOTE — TOC Progression Note (Signed)
Transition of Care (TOC) - Progression Note  ? ? ?Patient Details  ?Name: Kenneth Davila ?MRN: [id number] ?Date of Birth: 12-30-1955 ? ?Transition of Care (TOC) CM/SW Contact  ?Bartholomew Crews, RN ?Phone Number: [phone] ?02/24/2022, 11:41 AM ? ?Clinical Narrative:    ? ?Notified by Loree Fee with pulmonary of patient needing NIV for transition home. Noted CSW working on SNF for STR - verified with Thedore Mins at [employer],  If patient goes to SNF first, he will need bipap at SNF and then can transition to NIV when he transitions home. If patient transitions home from hospital, insurance approval will likely be received on Monday. TOC following.  ? ?Expected Discharge Plan: [place] ?Barriers to Discharge: Ship broker, Continued Medical Work up, SNF Pending bed offer (obesity) ? ?Expected Discharge Plan and Services ?Expected Discharge Plan: [place] ?In-house Referral: Clinical Social Work ?  ?  ?  ?                ?  ?  ?  ?  ?  ?  ?  ?  ?  ?  ? ? ?Social Determinants of Health (SDOH) Interventions ?  ? ?Readmission Risk Interventions ?No flowsheet data found. ? ?

## 2022-02-24 NOTE — Progress Notes (Signed)
Patient ID: Kenneth Davila, male   DOB: 04-21-1956, 66 y.o.   MRN: [id number] ?BP (!) 144/89 (BP Location: Left Arm)   Pulse 88   Temp 99.2 ?F (37.3 ?C) (Axillary)   Resp 17   Ht '6\' 2"'$  (1.88 m)   Wt (!) 170.1 kg   SpO2 100%   BMI 48.15 kg/m?  ?Alert and oriented x 4, speech is clear and fluent ?Moving all extremities, ?Currently using bipap ?Continue with pulmonary treatments ?

## 2022-02-24 NOTE — Progress Notes (Signed)
? ? ?  Brief PCCM consult note  ? ?Contacted by primary team to assist in obtaining NIV home vent for the management of patient obesity Hypoventilation Syndrome that is causing thoracic restrictive disease (BMI 48.15) ? ?Coordinated with TOC and [employer] to obtain NIV  ? ?Kenneth Davila presents with respiratory failure with hypoxia and hypercarbia with morbid obesity/Obesity Hypoventilation Syndrome that is causing thoracic restrictive disease.  The use of the NIV will treat patient?s high PC02 levels (67 on 02/23/22 with an elevated bicarb of 36.1 while using BiPAP during admission).  NIV use can reduce risk of exacerbations and future hospitalizations when used at night and during the day.  All alternate devices [phone] and [id number]) have been proven ineffective to provide essential volume control necessary to maintain acceptable CO2 levels.  An NIV with volume-targeted pressure support is necessary to prevent patient from life-threatening harm.  Interruption or failure to provide NIV would quickly lead to exacerbation of the patient?s condition, hospital admission, and likely harm to the patient. Continued use is preferred.  Patient is able to protect their airways and clear secretions on their own. ? ?Need paperwork was signed and placed in patients physical chart.  ? ?I will also send a staff message to our team to set patient up with outpatient pulmonary follow up   ? ?Kenneth Diegel D. Harris, NP-C ?[facility] ?Personal contact information can be found on Amion  ?02/24/2022, 12:07 PM ? ? ?

## 2022-02-25 ENCOUNTER — Inpatient Hospital Stay (HOSPITAL_COMMUNITY): Payer: Medicaid Other

## 2022-02-25 DIAGNOSIS — R451 Restlessness and agitation: Secondary | ICD-10-CM

## 2022-02-25 DIAGNOSIS — N179 Acute kidney failure, unspecified: Secondary | ICD-10-CM | POA: Diagnosis not present

## 2022-02-25 DIAGNOSIS — J9601 Acute respiratory failure with hypoxia: Secondary | ICD-10-CM

## 2022-02-25 DIAGNOSIS — R7989 Other specified abnormal findings of blood chemistry: Secondary | ICD-10-CM | POA: Diagnosis not present

## 2022-02-25 DIAGNOSIS — J9602 Acute respiratory failure with hypercapnia: Secondary | ICD-10-CM

## 2022-02-25 DIAGNOSIS — G894 Chronic pain syndrome: Secondary | ICD-10-CM | POA: Diagnosis not present

## 2022-02-25 LAB — URINALYSIS, ROUTINE W REFLEX MICROSCOPIC
Bacteria, UA: NONE SEEN
Bilirubin Urine: NEGATIVE
Glucose, UA: NEGATIVE mg/dL
Ketones, ur: 20 mg/dL — AB
Nitrite: NEGATIVE
Protein, ur: 100 mg/dL — AB
RBC / HPF: >50 RBC/hpf — ABNORMAL HIGH (ref 0–5)
Specific Gravity, Urine: 1.018 (ref 1.005–1.030)
pH: 5 (ref 5.0–8.0)

## 2022-02-25 LAB — BLOOD GAS, ARTERIAL
Acid-Base Excess: 11 mmol/L — ABNORMAL HIGH (ref 0.0–2.0)
Bicarbonate: 38.4 mmol/L — ABNORMAL HIGH (ref 20.0–28.0)
Drawn by: 441371
O2 Saturation: 98 %
Patient temperature: 37
pCO2 arterial: 62 mmHg — ABNORMAL HIGH (ref 32–48)
pH, Arterial: 7.4 (ref 7.35–7.45)
pO2, Arterial: 114 mmHg — ABNORMAL HIGH (ref 83–108)

## 2022-02-25 LAB — CBC WITH DIFFERENTIAL/PLATELET
Abs Immature Granulocytes: 0.02 10*3/uL (ref 0.00–0.07)
Basophils Absolute: 0 10*3/uL (ref 0.0–0.1)
Basophils Relative: 0 %
Eosinophils Absolute: 0.2 10*3/uL (ref 0.0–0.5)
Eosinophils Relative: 3 %
HCT: 36.2 % — ABNORMAL LOW (ref 39.0–52.0)
Hemoglobin: 11.1 g/dL — ABNORMAL LOW (ref 13.0–17.0)
Immature Granulocytes: 0 %
Lymphocytes Relative: 14 %
Lymphs Abs: 0.9 10*3/uL (ref 0.7–4.0)
MCH: 26.9 pg (ref 26.0–34.0)
MCHC: 30.7 g/dL (ref 30.0–36.0)
MCV: 87.7 fL (ref 80.0–100.0)
Monocytes Absolute: 0.8 10*3/uL (ref 0.1–1.0)
Monocytes Relative: 13 %
Neutro Abs: 4.3 10*3/uL (ref 1.7–7.7)
Neutrophils Relative %: 70 %
Platelets: 158 10*3/uL (ref 150–400)
RBC: 4.13 MIL/uL — ABNORMAL LOW (ref 4.22–5.81)
RDW: 12.9 % (ref 11.5–15.5)
WBC: 6.2 10*3/uL (ref 4.0–10.5)
nRBC: 0 % (ref 0.0–0.2)

## 2022-02-25 LAB — COMPREHENSIVE METABOLIC PANEL
ALT: 50 U/L — ABNORMAL HIGH (ref 0–44)
AST: 65 U/L — ABNORMAL HIGH (ref 15–41)
Albumin: 2.9 g/dL — ABNORMAL LOW (ref 3.5–5.0)
Alkaline Phosphatase: 58 U/L (ref 38–126)
Anion gap: 8 (ref 5–15)
BUN: 14 mg/dL (ref 8–23)
CO2: 33 mmol/L — ABNORMAL HIGH (ref 22–32)
Calcium: 8.6 mg/dL — ABNORMAL LOW (ref 8.9–10.3)
Chloride: 94 mmol/L — ABNORMAL LOW (ref 98–111)
Creatinine, Ser: 1.15 mg/dL (ref 0.61–1.24)
GFR, Estimated: >60 mL/min (ref >60)
Glucose, Bld: 108 mg/dL — ABNORMAL HIGH (ref 70–99)
Potassium: 3.9 mmol/L (ref 3.5–5.1)
Sodium: 135 mmol/L (ref 135–145)
Total Bilirubin: 0.9 mg/dL (ref 0.3–1.2)
Total Protein: 6.3 g/dL — ABNORMAL LOW (ref 6.5–8.1)

## 2022-02-25 LAB — D-DIMER, QUANTITATIVE: D-Dimer, Quant: 7.48 ug/mL-FEU — ABNORMAL HIGH (ref 0.00–0.50)

## 2022-02-25 LAB — AMMONIA: Ammonia: 25 umol/L (ref 9–35)

## 2022-02-25 LAB — BRAIN NATRIURETIC PEPTIDE: B Natriuretic Peptide: 17.4 pg/mL (ref 0.0–100.0)

## 2022-02-25 LAB — PHOSPHORUS: Phosphorus: 2 mg/dL — ABNORMAL LOW (ref 2.5–4.6)

## 2022-02-25 LAB — PROCALCITONIN: Procalcitonin: 0.59 ng/mL

## 2022-02-25 LAB — MAGNESIUM: Magnesium: 1.9 mg/dL (ref 1.7–2.4)

## 2022-02-25 MED ORDER — LORAZEPAM 2 MG/ML IJ SOLN
1.0000 mg | Freq: Four times a day (QID) | INTRAMUSCULAR | Status: DC | PRN
  Filled 2022-02-25: qty 1

## 2022-02-25 MED ORDER — HALOPERIDOL LACTATE 5 MG/ML IJ SOLN: 2.0000 mg | Freq: Four times a day (QID) | INTRAMUSCULAR | Status: DC | PRN

## 2022-02-25 MED ORDER — DEXMEDETOMIDINE HCL IN NACL 400 MCG/100ML IV SOLN
0.4000 ug/kg/h | INTRAVENOUS | Status: DC
  Administered 2022-02-25: 0.4 ug/kg/h via INTRAVENOUS
  Administered 2022-02-25: 0.5 ug/kg/h via INTRAVENOUS
  Administered 2022-02-26: 0.4 ug/kg/h via INTRAVENOUS
  Administered 2022-02-26: 0.5 ug/kg/h via INTRAVENOUS
  Filled 2022-02-25 (×4): qty 100

## 2022-02-25 MED ORDER — DEXMEDETOMIDINE HCL IN NACL 400 MCG/100ML IV SOLN: 0.4000 ug/kg/h | INTRAVENOUS | Status: DC

## 2022-02-25 MED ORDER — LORAZEPAM 2 MG/ML IJ SOLN: 2.0000 mg | Freq: Once | INTRAMUSCULAR | Status: DC

## 2022-02-25 MED ORDER — ORAL CARE MOUTH RINSE
15.0000 mL | Freq: Two times a day (BID) | OROMUCOSAL | Status: DC
  Administered 2022-02-25 – 2022-03-01 (×8): 15 mL via OROMUCOSAL

## 2022-02-25 MED ORDER — LORAZEPAM 2 MG/ML IJ SOLN
2.0000 mg | Freq: Once | INTRAMUSCULAR | Status: AC
  Administered 2022-02-25: 2 mg via INTRAVENOUS
  Filled 2022-02-25: qty 1

## 2022-02-25 MED ORDER — LORAZEPAM 2 MG/ML IJ SOLN
INTRAMUSCULAR | Status: AC
Start: 2022-02-25 — End: 2022-02-26
  Filled 2022-02-25: qty 1

## 2022-02-25 NOTE — Progress Notes (Signed)
PROGRESS NOTE    Kenneth Davila  WGY:[id number] DOB: [birth year] DOA: 02/19/2022 PCP: Nolene Ebbs, MD   Brief Narrative:  Kenneth Davila is a 66 y.o. 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  Patient underwent this procedure and postoperatively was doing but then intermittently became confused and hypercarbic.  Had to be placed on BiPAP and was doing well overnight however today he decompensated and became extremely confused and more somnolent and had to be placed on BiPAP.  Patient is fused at this time and does not know where he is he does not answer questions appropriately.  TRH was asked to consult on this patient given his acute hypercarbic hypoxic respiratory failure as well as a slight AKI.  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 If he goes to SNF first he will ned BiPAP at SNF and then transition to NIV when he transitions home. PT/OT recommending SNF.  Today he was more restless and confused and has been pulling off his BiPAP. Will evaluated his Respiratory Failure further. When he was not as confused this AM patient told me that he is supposed to be wearing 3 L of supplemental oxygen via nasal cannula but does not wear it and has been noncompliant with it.  Given that he still continues to require significant oxygen and be on BiPAP will evaluate him for PE and heart failure and a D-dimer is ordered and was elevated so we will get a CTA PE  protocol.  Also check an echocardiogram despite his BNP not being elevated.  Because of his agitation and his confusion we will place him on delirium precautions and also consult critical care for his acute on chronic respiratory failure with hypercarbia and hypoxia.    Assessment and Plan: * Acute respiratory failure with hypoxia and hypercarbia (HCC) OSA/OHS  -Reportedly patient has.  He went 3 L of supplemental oxygen via nasal cannula at home and has been noncompliant with -Primary reason for TRH Consult -ABG Findings    Component Value Date/Time   PHART 7.4 02/25/2022 1345   PCO2ART 62 (H) 02/25/2022 1345   PO2ART 114 (H) 02/25/2022 1345   HCO3 38.4 (H) 02/25/2022 1345   TCO2 37 (H) 02/23/2022 1644   O2SAT 98 02/25/2022 1345  -Likely has obesity hypoventilation syndrome given history of OSA  -Continue with BiPAP; He wears CPAP but has an issue with it is returning -Start Xopenex and Atrovent scheduled and chest x-ray was obtained y -We will initiate guaifenesin 1200 mg p.o. twice daily -SpO2: 98 % O2 Flow Rate (L/min): 7 L/min FiO2 (%): 40 % -Continuous pulse oximetry maintain O2 saturation greater than 90% -Start flutter valve, incentive spirometry -Continue supplemental oxygen via nasal cannula and wean O2 as tolerated -We will need outpatient sleep study if not already have one; He wears a CPAP but states it has to be returned given an issue with it  -Continue to monitor respiratory status carefully and will moved to progressive care unit and since he continues to not improve pulmonary has been consulted  for further evaluation and recommendations -Was not really wheezing on examination but if not improving will consider giving a dose of Solu-Medrol -Patient will need an Ambulatory Home O2 screen prior to D/C  -Given his Hypercarbia in the setting of Obesity Hypoventilation Syndrome that is causing thoracic restrictive disease he would benefit from a Trilogy Vent (NIV) and  Appreciate Pulmonary arranging and having follow up at D/C; because he is more agitated today we will do further work-up for his respiratory failure and obtain a CTA PE protocol as well as an echocardiogram to evaluate for heart failure -Wean O2 to home 3 L if able but given that he has worsened agitation and confusion on the BiPAP and continues to try and remove it we have consulted pulmonary for further evaluation recommendations  Agitation -Could be related to Pain meds or Hypercarbia but has only gotten Dilaudid and Oxy overnight and not during this shift and has Naloxone  -Has been on BiPAP most of the day but per nurse has noticed more agitation and Hallucinations -Initiated IV Lorazepam 1 mg q6hprn  -Delirium Precautions  -If persists may need some head imaging.   Obesity hypoventilation syndrome (HCC) -Causing Thoracic Restrictive Disease -Would benefit from NIV and have asked Pulmonary NP to help arrange -Will need Pulmonary Follow up at D/C and recommend continued weight loss and dietary counseling; because he continues to need BiPAP throughout the day we will consult pulmonary in-house   Abnormal LFTs - Unclear etiology but could be reactive and is now trending down -Patient's AST went from 124 is now 96 -> 65 -ALT is trending down and went from 60 is now 55 -> 50 -Continue monitor and trend carefully and if continues to be elevated or not improving will obtain right upper quadrant ultrasound as well as an acute hepatitis panel in the morning -Repeat CMP in the a.m.  Normocytic anemia -Patient's Hgb/Hct went from 17.0/50.0 -> 12.9/38.0 -> 12.0/39.6 -> 11.8/38.4 -> 11.1/36.2 -Checked Anemia Panel and showed iron level of 35, U IBC 255, TIBC of 290, saturation ratios of 12%, ferritin of 302, folate level of 8.2, vitamin B12 level of 476 -Continue to Monitor for S/Sx of Bleeding; No overt bleeding noted -Repeat CBC in the AM   AKI (acute kidney injury) ([place]) - Presented with a  BUN/creatinine of 10/1.17 with unclear baseline but has now trended up to 23/1.30 and is now trending down and is now 18/1.21 and is now 14/1.15 -Have discontinued his losartan and will avoid further nephrotoxic medications, contrast dyes, hypotension renally dose medications -Currently has a Foley catheter in place for the urethral stricture and is on antibiotics of cephalexin which have now completed. -Strict I's and O's and daily weights and will obtain a urinalysis -Check urine sodium and urine creatinine as well as urine osm -Started on IV fluid hydration with normal saline at 75 mils per hour by the primary team and I am in agreement with this and recommend continuing for a day or so and now stopping -Repeat CMP in the a.m. and repeat CMP now  Hyperammonemia ([place]) -Ammonia level noted to be 57 and is improve to 46 yesterday but will check again given that he is more agitated and confused -We will start lactulose 20 g p.o. twice daily and continue for now but may need to increase   Lumbar stenosis with neurogenic claudication -Per primary team  Morbid obesity ([place]) -Complicates overall prognosis and care -Estimated body mass index is 48.15 kg/m as calculated from  the following:   Height as of this encounter: '6\' 2"'$  (1.88 m).   Weight as of this encounter: 170.1 kg.  -Weight Loss and Dietary Counseling to be given once patient is much more awake and alert   Diabetes mellitus type 2, diet-controlled ([place]) - We will place on sensitive NovoLog sign scale insulin AC -Blood sugars have been ranging from 108-118 on BMPs -Continue monitor and trend blood sugars carefully  Tobacco dependence -Patient was confused intermittently throughout his hospitalization and unable to properly provide subjective history yesterday but is more awake and alert yesterday but today he is little bit more agitated -We will need to figure out how much she smokes and if necessary placed on nicotine patch and  provide smoking cessation counseling   Chronic pain syndrome - Patient has pain control per neurosurgery however we will give him some naloxone now given that he is confused and a little somnolent; His confusion had improved but he is on the BiPAP -We will need to be judicious with his pain control given his agitation and confusion now  GERD (gastroesophageal reflux disease) -Home meds do not show that he is taking any PPI -May benefit from PPI while he is hospitalized now   DVT prophylaxis: heparin injection 5,000 Units Start: 02/21/22 0915 SCD's Start: 02/19/22 1834    Code Status: Full Code Family Communication:   Disposition Plan:  Level of care: Progressive Status is: Inpatient Remains inpatient appropriate because: Remains Hypercarbic and on BiPAP; Needs weaning to Room Air    Consultants:  [facility] PCCM  Procedures:  2 level MIS decompression and TLIF  Antimicrobials:  Anti-infectives (From admission, onward)    Start     Dose/Rate Route Frequency Ordered Stop   02/20/22 1345  cephALEXin (KEFLEX) capsule 500 mg        500 mg Oral Every 12 hours 02/20/22 1257 02/25/22 0959   02/19/22 2000  ceFAZolin (ANCEF) IVPB 2g/100 mL premix        2 g 200 mL/hr over 30 Minutes Intravenous Every 8 hours 02/19/22 1833 02/20/22 0425   02/19/22 0603  ceFAZolin (ANCEF) 2-4 GM/100ML-% IVPB  Status:  Discontinued       Note to Pharmacy: Alba Cory B: cabinet override      02/19/22 0603 02/19/22 0742   02/19/22 0600  ceFAZolin (ANCEF) IVPB 3g/100 mL premix        3 g 200 mL/hr over 30 Minutes Intravenous On call to O.R. 02/19/22 0554 02/19/22 1200        Subjective: Seen and examined at bedside and he was awake and alert but a little restless.  He is wearing BiPAP and continued to wear it throughout the whole morning this morning per nursing and only had it off for about an hour and a half and had to go back on it.  He continues to have some back pain.  Denies any  chest pain or shortness of breath however he subsequently became more more agitated and started hallucinating.  He to monitor him carefully and given his persistence on BiPAP we have consulted pulmonary for further evaluation recommendations.  Objective: Vitals:   02/25/22 0555 02/25/22 0743 02/25/22 1050 02/25/22 1339  BP: 130/62 (!) 148/78    Pulse: 92 94 92 93  Resp: (!) 22 (!) '21 19 17  '$ Temp:  97.8 F (36.6 C) 98.7 F (37.1 C)   TempSrc:  Oral Oral   SpO2: 93% 95% 96% 98%  Weight:  Height:        Intake/Output Summary (Last 24 hours) at 02/25/2022 1516 Last data filed at 02/25/2022 7124 Gross per 24 hour  Intake 1682.69 ml  Output 2550 ml  Net -867.31 ml   Filed Weights   02/19/22 0603  Weight: (!) 170.1 kg   Examination: Physical Exam:  Constitutional: WN/WD super morbidly obese AAM in NAD and appears a little restless Respiratory: Diminished to auscultation bilaterally with coarse breath sounds, no wheezing, rales, rhonchi or crackles.  Is a little tachypneic and is wearing BiPAP Cardiovascular: RRR, no murmurs / rubs / gallops. S1 and S2 auscultated.  Has 1+ lower extremity edema Abdomen: Soft, non-tender, distended secondary body habitus.  Bowel sounds positive.  GU: Has a Foley catheter in place Musculoskeletal: No clubbing / cyanosis of digits/nails.  Skin: No rashes, lesions, ulcers. No induration; Warm and dry.  Neurologic: CN 2-12 grossly intact with no focal deficits.   Data Reviewed: I have personally reviewed following labs and imaging studies  CBC: Recent Labs  Lab 02/22/22 1055 02/23/22 1644 02/23/22 1730 02/24/22 0351 02/25/22 0143  WBC  --   --  6.0 5.9 6.2  NEUTROABS  --   --  3.7 3.8 4.3  HGB 17.0 12.9* 12.0* 11.8* 11.1*  HCT 50.0 38.0* 39.6 38.4* 36.2*  MCV  --   --  87.2 87.9 87.7  PLT  --   --  173 170 580   Basic Metabolic Panel: Recent Labs  Lab 02/23/22 0823 02/23/22 1644 02/23/22 1730 02/24/22 0351 02/25/22 0143  NA 134*  135 135 134* 135  K 4.1 4.2 4.1 3.9 3.9  CL 94*  --  97* 96* 94*  CO2 28  --  30 32 33*  GLUCOSE 118*  --  108* 114* 108*  BUN 23  --  '21 18 14  '$ CREATININE 1.30*  --  1.29* 1.21 1.15  CALCIUM 8.7*  --  8.7* 8.7* 8.6*  MG  --   --  2.4 2.4 1.9  PHOS 1.9*  --  2.3* 2.4* 2.0*   GFR: Estimated Creatinine Clearance: 109.2 mL/min (by C-G formula based on SCr of 1.15 mg/dL). Liver Function Tests: Recent Labs  Lab 02/23/22 0823 02/23/22 1730 02/24/22 0351 02/25/22 0143  AST  --  124* 96* 65*  ALT  --  60* 55* 50*  ALKPHOS  --  60 63 58  BILITOT  --  0.6 0.5 0.9  PROT  --  6.7 6.6 6.3*  ALBUMIN 3.3* 3.2* 3.1* 2.9*   No results for input(s): LIPASE, AMYLASE in the last 168 hours. Recent Labs  Lab 02/23/22 1638 02/24/22 1000  AMMONIA 57* 46*   Coagulation Profile: No results for input(s): INR, PROTIME in the last 168 hours. Cardiac Enzymes: No results for input(s): CKTOTAL, CKMB, CKMBINDEX, TROPONINI in the last 168 hours. BNP (last 3 results) No results for input(s): PROBNP in the last 8760 hours. HbA1C: No results for input(s): HGBA1C in the last 72 hours. CBG: Recent Labs  Lab 02/21/22 2008  GLUCAP 127*   Lipid Profile: No results for input(s): CHOL, HDL, LDLCALC, TRIG, CHOLHDL, LDLDIRECT in the last 72 hours. Thyroid Function Tests: Recent Labs    02/23/22 1737  TSH 1.467   Anemia Panel: Recent Labs    02/24/22 0351  VITAMINB12 476  FOLATE 8.2  FERRITIN 302  TIBC 290  IRON 35*  RETICCTPCT 1.4   Sepsis Labs: Recent Labs  Lab 02/23/22 1730 02/23/22 2022 02/24/22 0351 02/25/22 0143  PROCALCITON 0.43  --  0.34 0.59  LATICACIDVEN 0.9 0.8  --   --    Recent Results (from the past 240 hour(s))  SARS Coronavirus 2 by RT PCR (hospital order, performed in [facility] hospital lab) Nasopharyngeal Nasopharyngeal Swab     Status: None   Collection Time: 02/16/22 10:08 AM   Specimen: Nasopharyngeal Swab  Result Value Ref Range Status   SARS Coronavirus 2  NEGATIVE NEGATIVE Final    Comment: (NOTE) SARS-CoV-2 target nucleic acids are NOT DETECTED.  The SARS-CoV-2 RNA is generally detectable in upper and lower respiratory specimens during the acute phase of infection. The lowest concentration of SARS-CoV-2 viral copies this assay can detect is 250 copies / mL. A negative result does not preclude SARS-CoV-2 infection and should not be used as the sole basis for treatment or other patient management decisions.  A negative result may occur with improper specimen collection / handling, submission of specimen other than nasopharyngeal swab, presence of viral mutation(s) within the areas targeted by this assay, and inadequate number of viral copies (<250 copies / mL). A negative result must be combined with clinical observations, patient history, and epidemiological information.  Fact Sheet for Patients:   StrictlyIdeas.no  Fact Sheet for Healthcare Providers: BankingDealers.co.za  This test is not yet approved or  cleared by the [place] FDA and has been authorized for detection and/or diagnosis of SARS-CoV-2 by FDA under an Emergency Use Authorization (EUA).  This EUA will remain in effect (meaning this test can be used) for the duration of the COVID-19 declaration under Section 564(b)(1) of the Act, 21 U.S.C. section 360bbb-3(b)(1), unless the authorization is terminated or revoked sooner.  Performed at [facility], [place] [address] [place], [place] [address]     Radiology Studies: DG CHEST PORT 1 VIEW  Result Date: 02/25/2022 CLINICAL DATA:  66 year old male with history of shortness of breath. EXAM: PORTABLE CHEST 1 VIEW COMPARISON:  Chest x-ray 02/23/2022. FINDINGS: Lung volumes are low. No consolidative airspace disease. No pleural effusions. No pneumothorax. No pulmonary nodule or mass noted. No evidence of pulmonary edema. Mild cardiomegaly. Upper mediastinal contours are  within normal limits. IMPRESSION: 1. Low lung volumes without radiographic evidence of acute cardiopulmonary disease. 2. Mild cardiomegaly. Electronically Signed   By: Vinnie Langton M.D.   On: 02/25/2022 08:59   DG CHEST PORT 1 VIEW  Result Date: 02/23/2022 CLINICAL DATA:  Shortness of breath. EXAM: PORTABLE CHEST 1 VIEW COMPARISON:  12/24/2019 FINDINGS: Low lung volumes are noted. Cardiomegaly is again demonstrated. No evidence of pulmonary consolidation or pleural effusion. IMPRESSION: Low lung volumes and cardiomegaly. No acute findings. Electronically Signed   By: Marlaine Hind M.D.   On: 02/23/2022 18:03    Scheduled Meds:  Chlorhexidine Gluconate Cloth  6 each Topical Daily   docusate sodium  100 mg Oral BID   heparin injection (subcutaneous)  5,000 Units Subcutaneous Q8H   ipratropium  0.5 mg Nebulization BID   lactulose  20 g Oral BID   levalbuterol  0.63 mg Nebulization BID   pregabalin  150 mg Oral Daily   sodium chloride flush  3 mL Intravenous Q12H   Continuous Infusions:  sodium chloride       LOS: 6 days   Raiford Noble, DO [facility] Available via Epic secure chat 7am-7pm After these hours, please refer to coverage provider listed on [email or web address] 02/25/2022, 3:16 PM

## 2022-02-25 NOTE — Progress Notes (Signed)
Upon my arrival pt trying to get out of bed to chair and pulled BIPAP hose apart from mask. SPo2 reading in 80's. Hooked hose back up to mask and made pt sit in chair. NT at bedside.  ?

## 2022-02-25 NOTE — TOC Progression Note (Signed)
Transition of Care (TOC) - Progression Note  ? ? ?Patient Details  ?Name: Kenneth Davila ?MRN: [id number] ?Date of Birth: [birth year] ? ?Transition of Care (TOC) CM/SW Contact  ?Bartholomew Crews, RN ?Phone Number: [phone] ?02/25/2022, 8:45 AM ? ?Clinical Narrative:    ? ?Update from yesterday. Order form for NIV completed by provider yesterday and emailed to [facility] at [place] for processing. Confirmed receipt of emailed order. Anticipate that insurance authorization will be received tomorrow.  ? ?If patient goes to SNF before going home. NIV to be processed following SNF stay. Patient will need bipap at SNF - bipap settings provided by [employer]  - BiPAP settings for the SNF will need to read "IPAP 14 cmH20; EPAP 8 cmH2O". ? ?TOC following for transition needs.  ? ?Expected Discharge Plan: [place] ?Barriers to Discharge: Ship broker, Continued Medical Work up, SNF Pending bed offer (obesity) ? ?Expected Discharge Plan and Services ?Expected Discharge Plan: [place] ?In-house Referral: Clinical Social Work ?  ?  ?  ?                ?  ?  ?  ?  ?  ?  ?  ?  ?  ?  ? ? ?Social Determinants of Health (SDOH) Interventions ?  ? ?Readmission Risk Interventions ?No flowsheet data found. ? ?
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
## 2022-02-25 NOTE — Progress Notes (Signed)
Pt unable to do CT angio due to agitation and difficulty laying still.  Now transferred to ICU.  Will start on precedex for RASS goal 0 to -1. ? ?Chesley Mires, MD ?[place] ?Pager - [phone] ?02/25/2022, 5:44 PM ? ?

## 2022-02-25 NOTE — Progress Notes (Signed)
Belongings at bedside on admission to [facility] ICU: ?Black fanny pack ?[place] + wallet ?Inhaler (*taken down to be stored in main pharmacy, sheet in chart*) ?Black earbuds in case ?Black watch ?Cell phone ?Glasses ?
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
## 2022-02-25 NOTE — Assessment & Plan Note (Addendum)
-  Could be related to Pain meds or Hypercarbia but has only gotten Dilaudid and Oxy overnight and not during this shift and has Naloxone  ?-Has been on BiPAP most of the day but per nurse has noticed more agitation and Hallucinations ?-Initiated IV Lorazepam 1 mg q6hprn but continues to be extremely agitated ?-Start Haldol 2 mg IV q6hprn ?-Delirium Precautions  ?-If persists may need some head imaging.  ?-The patient had to be taken to the ICU and placed on a Precedex drip which is now weaned off of.  His agitation and his somnolence has improved significantly. ?

## 2022-02-25 NOTE — Progress Notes (Signed)
Patient currently off BiPAP with nasal cannula on. Sitting in chair with breakfast tray. RN at bedside. ?

## 2022-02-25 NOTE — Progress Notes (Signed)
[facility] consult received to obtain IV access after patient pulled out his IV access. Pt has removed 4 PIV's. Alternative routes of therapy or restraints will need to be considered if patient pulls out IV access again. ?
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
## 2022-02-25 NOTE — Progress Notes (Signed)
Patient resting comfortably on [place]. Weaned from 7L to 5L. Spo2 100%. BIPAP on standby at bedside. No distress noted at this time. ?

## 2022-02-26 ENCOUNTER — Inpatient Hospital Stay (HOSPITAL_COMMUNITY): Payer: Medicaid Other

## 2022-02-26 DIAGNOSIS — G894 Chronic pain syndrome: Secondary | ICD-10-CM | POA: Diagnosis not present

## 2022-02-26 DIAGNOSIS — E119 Type 2 diabetes mellitus without complications: Secondary | ICD-10-CM

## 2022-02-26 DIAGNOSIS — N179 Acute kidney failure, unspecified: Secondary | ICD-10-CM

## 2022-02-26 DIAGNOSIS — E662 Morbid (severe) obesity with alveolar hypoventilation: Secondary | ICD-10-CM

## 2022-02-26 DIAGNOSIS — R0609 Other forms of dyspnea: Secondary | ICD-10-CM | POA: Diagnosis not present

## 2022-02-26 DIAGNOSIS — R7989 Other specified abnormal findings of blood chemistry: Secondary | ICD-10-CM | POA: Diagnosis not present

## 2022-02-26 DIAGNOSIS — E7220 Disorder of urea cycle metabolism, unspecified: Secondary | ICD-10-CM

## 2022-02-26 DIAGNOSIS — R590 Localized enlarged lymph nodes: Secondary | ICD-10-CM

## 2022-02-26 DIAGNOSIS — I719 Aortic aneurysm of unspecified site, without rupture: Secondary | ICD-10-CM

## 2022-02-26 LAB — CBC WITH DIFFERENTIAL/PLATELET
Abs Immature Granulocytes: 0.03 10*3/uL (ref 0.00–0.07)
Basophils Absolute: 0 10*3/uL (ref 0.0–0.1)
Basophils Relative: 1 %
Eosinophils Absolute: 0.2 10*3/uL (ref 0.0–0.5)
Eosinophils Relative: 4 %
HCT: 37.2 % — ABNORMAL LOW (ref 39.0–52.0)
Hemoglobin: 11.4 g/dL — ABNORMAL LOW (ref 13.0–17.0)
Immature Granulocytes: 1 %
Lymphocytes Relative: 17 %
Lymphs Abs: 0.9 10*3/uL (ref 0.7–4.0)
MCH: 26.6 pg (ref 26.0–34.0)
MCHC: 30.6 g/dL (ref 30.0–36.0)
MCV: 86.7 fL (ref 80.0–100.0)
Monocytes Absolute: 0.8 10*3/uL (ref 0.1–1.0)
Monocytes Relative: 15 %
Neutro Abs: 3.3 10*3/uL (ref 1.7–7.7)
Neutrophils Relative %: 62 %
Platelets: 178 10*3/uL (ref 150–400)
RBC: 4.29 MIL/uL (ref 4.22–5.81)
RDW: 13 % (ref 11.5–15.5)
WBC: 5.2 10*3/uL (ref 4.0–10.5)
nRBC: 0 % (ref 0.0–0.2)

## 2022-02-26 LAB — COMPREHENSIVE METABOLIC PANEL
ALT: 47 U/L — ABNORMAL HIGH (ref 0–44)
AST: 49 U/L — ABNORMAL HIGH (ref 15–41)
Albumin: 2.8 g/dL — ABNORMAL LOW (ref 3.5–5.0)
Alkaline Phosphatase: 59 U/L (ref 38–126)
Anion gap: 9 (ref 5–15)
BUN: 14 mg/dL (ref 8–23)
CO2: 34 mmol/L — ABNORMAL HIGH (ref 22–32)
Calcium: 8.9 mg/dL (ref 8.9–10.3)
Chloride: 95 mmol/L — ABNORMAL LOW (ref 98–111)
Creatinine, Ser: 1.2 mg/dL (ref 0.61–1.24)
GFR, Estimated: >60 mL/min (ref >60)
Glucose, Bld: 118 mg/dL — ABNORMAL HIGH (ref 70–99)
Potassium: 4.1 mmol/L (ref 3.5–5.1)
Sodium: 138 mmol/L (ref 135–145)
Total Bilirubin: 0.7 mg/dL (ref 0.3–1.2)
Total Protein: 6.3 g/dL — ABNORMAL LOW (ref 6.5–8.1)

## 2022-02-26 LAB — ECHOCARDIOGRAM COMPLETE
Area-P 1/2: 2.42 cm2
Calc EF: 65.4 %
Height: 74 in
S' Lateral: 3.3 cm
Single Plane A2C EF: 65.7 %
Single Plane A4C EF: 63.3 %
Weight: 5767.23 oz

## 2022-02-26 LAB — PHOSPHORUS: Phosphorus: 2.7 mg/dL (ref 2.5–4.6)

## 2022-02-26 LAB — MAGNESIUM: Magnesium: 1.8 mg/dL (ref 1.7–2.4)

## 2022-02-26 MED ORDER — IOHEXOL 350 MG/ML SOLN
65.0000 mL | Freq: Once | INTRAVENOUS | Status: AC | PRN
  Administered 2022-02-26: 65 mL via INTRAVENOUS

## 2022-02-26 MED ORDER — PERFLUTREN LIPID MICROSPHERE
1.0000 mL | INTRAVENOUS | Status: AC | PRN
  Administered 2022-02-26: 1.5 mL via INTRAVENOUS
  Filled 2022-02-26: qty 10

## 2022-02-26 MED ORDER — QUETIAPINE FUMARATE 50 MG PO TABS
50.0000 mg | ORAL_TABLET | Freq: Every day | ORAL | Status: DC
  Administered 2022-02-26 – 2022-03-01 (×4): 50 mg via ORAL
  Filled 2022-02-26: qty 2
  Filled 2022-02-26 (×2): qty 1
  Filled 2022-02-26: qty 2

## 2022-02-26 MED ORDER — MAGNESIUM SULFATE 2 GM/50ML IV SOLN
2.0000 g | Freq: Once | INTRAVENOUS | Status: AC
  Administered 2022-02-26: 2 g via INTRAVENOUS
  Filled 2022-02-26: qty 50

## 2022-02-26 NOTE — Progress Notes (Signed)
PROGRESS NOTE    Kenneth Davila  GHW:[id number] DOB: 09-29-1956 DOA: 02/19/2022 PCP: Nolene Ebbs, MD   Brief Narrative:  Kenneth Davila is a 66 y.o. 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  Patient underwent this procedure and postoperatively was doing but then intermittently became confused and hypercarbic.  Had to be placed on BiPAP and was doing well overnight however today he decompensated and became extremely confused and more somnolent and had to be placed on BiPAP.  Patient is fused at this time and does not know where he is he does not answer questions appropriately.  TRH was asked to consult on this patient given his acute hypercarbic hypoxic respiratory failure as well as a slight AKI.  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 If he goes to SNF first he will ned BiPAP at SNF and then transition to NIV when he transitions home. PT/OT recommending SNF.  02/25/2022 he was more restless and confused and has been pulling off his BiPAP. Will evaluated his Respiratory Failure further. When he was not as confused this AM patient told me that he is supposed to be wearing 3 L of supplemental oxygen via nasal cannula but does not wear it and has been noncompliant with it.  Given that he still continues to require significant oxygen and be on BiPAP will evaluate him for PE and heart failure and a D-dimer is ordered and was elevated so we will get a CTA  PE protocol.  Also check an echocardiogram despite his BNP not being elevated.  Because of his agitation and his confusion we will place him on delirium precautions and also consult critical care for his acute on chronic respiratory failure with hypercarbia and hypoxia.  02/26/2022 given his significant agitation and his acute on chronic respiratory failure with hypercarbia critical care was consulted and took the patient to the ICU and placed him on a Precedex drip.  He has not been weaned off of Precedex.  Echocardiogram and CT of the chest PE were done finally: CTA of the chest showed a mildly prominent pulmonary trunk with no arterial embolus seen throughout the segmental divisions but the subsegmental arteries were unopacified and not evaluated.  There is mild cardiomegaly without venous distention or edema and trace pleural effusions.  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  Echocardiogram was done and was normal with an EF of 60 to 65% and no wall motion abnormalities and normal diastolic parameters.  He has now since been weaned off of the Precedex drip and has been weaned off of BiPAP on supplemental oxygen via nasal cannula    Assessment and Plan: *  Acute respiratory failure with hypoxia and hypercarbia (HCC) OSA/OHS  -Reportedly patient has.  He went 3 L of supplemental oxygen via nasal cannula at home and has been noncompliant with -Primary reason for TRH Consult -ABG Findings    Component Value Date/Time   PHART 7.4 02/25/2022 1345    PCO2ART 62 (H) 02/25/2022 1345   PO2ART 114 (H) 02/25/2022 1345   HCO3 38.4 (H) 02/25/2022 1345   TCO2 37 (H) 02/23/2022 1644   O2SAT 98 02/25/2022 1345  -Likely has obesity hypoventilation syndrome given history of OSA  -Continue with BiPAP; He wears CPAP but has an issue with it is returning -Start Xopenex and Atrovent scheduled and chest x-ray was obtained y -We will initiate guaifenesin 1200 mg p.o. twice daily -SpO2: 98 % O2 Flow Rate (L/min): 7 L/min FiO2 (%): 40 % -Continuous pulse oximetry maintain O2 saturation greater than 90% -Start flutter valve, incentive spirometry -Continue supplemental oxygen via nasal cannula and wean O2 as tolerated -We will need outpatient sleep study if not already have one; He wears a CPAP but states it has to be returned given an issue with it  -Continue to monitor respiratory status carefully and will moved to progressive care unit and since he continues to not improve pulmonary has been consulted for further evaluation and recommendations -Was not really wheezing on examination but if not improving will consider giving a dose of Solu-Medrol -Patient will need an Ambulatory Home O2 screen prior to D/C  -Given his Hypercarbia in the setting of Obesity Hypoventilation Syndrome that is causing thoracic restrictive disease he would benefit from a Trilogy Vent (NIV) and Appreciate Pulmonary arranging and having follow up at D/C; because he is more agitated today we will do further work-up for his respiratory failure and obtain a CTA PE protocol as well as an echocardiogram to evaluate for heart failure -Wean O2 to home 3 L if able but given that he has worsened agitation and confusion on the BiPAP and continues to try and remove it we have consulted pulmonary for further evaluation recommendations  Agitation -Could be related to Pain meds or Hypercarbia but has only gotten Dilaudid and Oxy overnight and not during this shift and has Naloxone  -Has been on  BiPAP most of the day but per nurse has noticed more agitation and Hallucinations -Initiated IV Lorazepam 1 mg q6hprn but continues to be extremely agitated -Start Haldol 2 mg IV q6hprn -Delirium Precautions  -If persists may need some head imaging.   Obesity hypoventilation syndrome (HCC) -Causing Thoracic Restrictive Disease -Would benefit from NIV and have asked Pulmonary NP to help arrange -Will need Pulmonary Follow up at D/C and recommend continued weight loss and dietary counseling; because he continues to need BiPAP throughout the day we will consult pulmonary in-house   Abnormal LFTs - Unclear etiology but could be reactive and is now trending down -Patient's AST went from 124 is now 96 -> 65 -ALT is trending down and went from 60 is now 55 -> 50 -Continue monitor and trend carefully and if continues to be elevated or not improving will obtain right upper quadrant ultrasound as well as an acute hepatitis panel in the morning -Repeat CMP in the a.m.  Normocytic anemia -Patient's Hgb/Hct went from 17.0/50.0 -> 12.9/38.0 -> 12.0/39.6 -> 11.8/38.4 -> 11.1/36.2 -Checked Anemia Panel and showed iron level of 35, U IBC 255, TIBC of 290, saturation ratios of 12%, ferritin of 302, folate level of 8.2, vitamin B12 level of 476 -Continue  to Monitor for S/Sx of Bleeding; No overt bleeding noted -Repeat CBC in the AM   AKI (acute kidney injury) ([place]) - Presented with a BUN/creatinine of 10/1.17 with unclear baseline but has now trended up to 23/1.30 and is now trending down and is now 18/1.21 and is now 14/1.15 -Have discontinued his losartan and will avoid further nephrotoxic medications, contrast dyes, hypotension renally dose medications -Currently has a Foley catheter in place for the urethral stricture and is on antibiotics of cephalexin which have now completed. -Strict I's and O's and daily weights and will obtain a urinalysis -Check urine sodium and urine creatinine as well as urine  osm -Started on IV fluid hydration with normal saline at 75 mils per hour by the primary team and I am in agreement with this and recommend continuing for a day or so and now stopping -Repeat CMP in the a.m. and repeat CMP now  Hyperammonemia ([place]) -Ammonia level noted to be 57 and is improve to 46 yesterday but will check again given that he is more agitated and confused -We will start lactulose 20 g p.o. twice daily and continue for now but may need to increase   Lumbar stenosis with neurogenic claudication -Per primary team  Morbid obesity ([place]) -Complicates overall prognosis and care -Estimated body mass index is 48.15 kg/m as calculated from the following:   Height as of this encounter: '6\' 2"'$  (1.88 m).   Weight as of this encounter: 170.1 kg.  -Weight Loss and Dietary Counseling to be given once patient is much more awake and alert   Diabetes mellitus type 2, diet-controlled ([place]) - We will place on sensitive NovoLog sign scale insulin AC -Blood sugars have been ranging from 108-118 on BMPs -Continue monitor and trend blood sugars carefully  Tobacco dependence -Patient was confused intermittently throughout his hospitalization and unable to properly provide subjective history yesterday but is more awake and alert yesterday but today he is little bit more agitated -We will need to figure out how much she smokes and if necessary placed on nicotine patch and provide smoking cessation counseling   Chronic pain syndrome - Patient has pain control per neurosurgery however we will give him some naloxone now given that he is confused and a little somnolent; His confusion had improved but he is on the BiPAP -We will need to be judicious with his pain control given his agitation and confusion now  GERD (gastroesophageal reflux disease) -Home meds do not show that he is taking any PPI -May benefit from PPI while he is hospitalized now   DVT prophylaxis: heparin injection 5,000 Units  Start: 02/21/22 0915 SCD's Start: 02/19/22 1834    Code Status: Full Code Family Communication: No family present at bedside   Disposition Plan:  Level of care: ICU Status is: Inpatient Remains inpatient appropriate because: We will need to ensure safe discharge disposition per primary team and will need to ensure that he has his NIV set up   Consultants:  [facility] Neurosurgery is primary  Procedures:  2 level MIS decompression and TLIF  Antimicrobials:  Anti-infectives (From admission, onward)    Start     Dose/Rate Route Frequency Ordered Stop   02/20/22 1345  cephALEXin (KEFLEX) capsule 500 mg  Status:  Discontinued        500 mg Oral Every 12 hours 02/20/22 1257 02/25/22 0959   02/19/22 2000  ceFAZolin (ANCEF) IVPB 2g/100 mL premix        2 g 200 mL/hr  over 30 Minutes Intravenous Every 8 hours 02/19/22 1833 02/20/22 0425   02/19/22 0603  ceFAZolin (ANCEF) 2-4 GM/100ML-% IVPB  Status:  Discontinued       Note to Pharmacy: Alba Cory B: cabinet override      02/19/22 0603 02/19/22 0742   02/19/22 0600  ceFAZolin (ANCEF) IVPB 3g/100 mL premix        3 g 200 mL/hr over 30 Minutes Intravenous On call to O.R. 02/19/22 0554 02/19/22 1200        Subjective: Seen and examined at bedside and is much more awake and alert and has been weaned off of Precedex drip no longer wearing BiPAP.  He felt well and complained of some back pain.  No nausea or vomiting.  Denied any other concerns or complaints this time and states he still not have a bowel movement yet but feels like 1 is coming  Objective: Vitals:   02/26/22 1500 02/26/22 1600 02/26/22 1700 02/26/22 1800  BP: 135/70  (!) 133/56   Pulse: 92  90 94  Resp: 18  20 (!) 30  Temp:  98 F (36.7 C)    TempSrc:  Oral    SpO2: 93%  98% 93%  Weight:      Height:        Intake/Output Summary (Last 24 hours) at 02/26/2022 1907 Last data filed at 02/26/2022 1800 Gross per 24 hour  Intake 1152.94 ml  Output 3120 ml  Net  -1967.06 ml   Filed Weights   02/19/22 0603 02/25/22 1730  Weight: (!) 170.1 kg (!) 163.5 kg   Examination: Physical Exam:  Constitutional: WN/WD super morbidly obese African-American male who is much more calm and comfortable today Respiratory: Diminished to auscultation bilaterally with coarse breath sounds, no wheezing, rales, rhonchi or crackles. Normal respiratory effort and patient is not tachypenic. No accessory muscle use.  Wearing supplemental oxygen via nasal cannula Cardiovascular: RRR, no murmurs / rubs / gallops. S1 and S2 auscultated.  Has 1+ extremity edema Abdomen: Soft, non-tender, distended secondary body habitus.  Bowel sounds positive.  GU: Deferred. Musculoskeletal: No clubbing / cyanosis of digits/nails. No joint deformity upper and lower extremities.  Neurologic: CN 2-12 grossly intact with no focal deficits.   Data Reviewed: I have personally reviewed following labs and imaging studies  CBC: Recent Labs  Lab 02/23/22 1644 02/23/22 1730 02/24/22 0351 02/25/22 0143 02/26/22 0500  WBC  --  6.0 5.9 6.2 5.2  NEUTROABS  --  3.7 3.8 4.3 3.3  HGB 12.9* 12.0* 11.8* 11.1* 11.4*  HCT 38.0* 39.6 38.4* 36.2* 37.2*  MCV  --  87.2 87.9 87.7 86.7  PLT  --  173 170 158 364   Basic Metabolic Panel: Recent Labs  Lab 02/23/22 0823 02/23/22 1644 02/23/22 1730 02/24/22 0351 02/25/22 0143 02/26/22 0500  NA 134* 135 135 134* 135 138  K 4.1 4.2 4.1 3.9 3.9 4.1  CL 94*  --  97* 96* 94* 95*  CO2 28  --  30 32 33* 34*  GLUCOSE 118*  --  108* 114* 108* 118*  BUN 23  --  '21 18 14 14  '$ CREATININE 1.30*  --  1.29* 1.21 1.15 1.20  CALCIUM 8.7*  --  8.7* 8.7* 8.6* 8.9  MG  --   --  2.4 2.4 1.9 1.8  PHOS 1.9*  --  2.3* 2.4* 2.0* 2.7   GFR: Estimated Creatinine Clearance: 102.2 mL/min (by C-G formula based on SCr of 1.2 mg/dL). Liver Function Tests:  Recent Labs  Lab 02/23/22 0823 02/23/22 1730 02/24/22 0351 02/25/22 0143 02/26/22 0500  AST  --  124* 96* 65* 49*   ALT  --  60* 55* 50* 47*  ALKPHOS  --  60 63 58 59  BILITOT  --  0.6 0.5 0.9 0.7  PROT  --  6.7 6.6 6.3* 6.3*  ALBUMIN 3.3* 3.2* 3.1* 2.9* 2.8*   No results for input(s): LIPASE, AMYLASE in the last 168 hours. Recent Labs  Lab 02/23/22 1638 02/24/22 1000 02/25/22 1441  AMMONIA 57* 46* 25   Coagulation Profile: No results for input(s): INR, PROTIME in the last 168 hours. Cardiac Enzymes: No results for input(s): CKTOTAL, CKMB, CKMBINDEX, TROPONINI in the last 168 hours. BNP (last 3 results) No results for input(s): PROBNP in the last 8760 hours. HbA1C: No results for input(s): HGBA1C in the last 72 hours. CBG: Recent Labs  Lab 02/21/22 2008  GLUCAP 127*   Lipid Profile: No results for input(s): CHOL, HDL, LDLCALC, TRIG, CHOLHDL, LDLDIRECT in the last 72 hours. Thyroid Function Tests: No results for input(s): TSH, T4TOTAL, FREET4, T3FREE, THYROIDAB in the last 72 hours. Anemia Panel: Recent Labs    02/24/22 0351  VITAMINB12 476  FOLATE 8.2  FERRITIN 302  TIBC 290  IRON 35*  RETICCTPCT 1.4   Sepsis Labs: Recent Labs  Lab 02/23/22 1730 02/23/22 2022 02/24/22 0351 02/25/22 0143  PROCALCITON 0.43  --  0.34 0.59  LATICACIDVEN 0.9 0.8  --   --     No results found for this or any previous visit (from the past 240 hour(s)).   Radiology Studies: CT Angio Chest Pulmonary Embolism (PE) W or WO Contrast  Result Date: 02/26/2022 CLINICAL DATA:  Positive D-dimer, suspected pulmonary embolism. EXAM: CT ANGIOGRAPHY CHEST WITH CONTRAST TECHNIQUE: Multidetector CT imaging of the chest was performed using the standard protocol during bolus administration of intravenous contrast. Multiplanar CT image reconstructions and MIPs were obtained to evaluate the vascular anatomy. RADIATION DOSE REDUCTION: This exam was performed according to the departmental dose-optimization program which includes automated exposure control, adjustment of the mA and/or kV according to patient size  and/or use of iterative reconstruction technique. CONTRAST:  8m OMNIPAQUE IOHEXOL 350 MG/ML SOLN COMPARISON:  Portable chest yesterday, portable chest 02/23/2022, CTA chest 12/22/2019 FINDINGS: Cardiovascular: The pulmonary trunk is mildly prominent at 3.4 cm consistent with arterial hypertension. Previous measurement also 3.4 cm. No arterial embolus is seen through the segmental divisions but due to bolus timing the subsegmental arterial bed is unopacified and not evaluated. There is mild cardiomegaly without significant venous distention and no pericardial fluid is seen. There is no visible coronary artery calcification. 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 No aortic dissection or penetrating ulcer. Great vessels opacify normally without significant plaques. Mediastinum/Nodes: There are mildly prominent right paratracheal space nodes up to 1.2 cm in short axis, subcarinal lymph nodes up to 1.3 cm in short axis to the right, and slight prominence of a few left and right hilar lymph nodes up to 1.1 cm in short axis. These lymph nodes were larger on the previous study. Active clinical significance is questionable. Lungs/Pleura: There are bilateral symmetric trace pleural effusions. There previously were multifocal patchy as well as confluent ground-glass opacities which are no longer seen. There is posterior atelectasis in the lower lobes. Central bronchial thickening in the lower lobes is noted. Early centrilobular and  paraseptal emphysematous changes in the lung apices are present. No infiltrate or nodule is seen. Upper Abdomen: No acute abnormality.  Moderate hepatic steatosis. Musculoskeletal: There are bridging osteophytes of the thoracic spine of DI SH eccentric to the right anterolaterally. The ribcage is intact. No focal bone lesion is seen Review of the MIP images confirms  the above findings. IMPRESSION: 1. Mildly prominent pulmonary trunk. No arterial embolus is seen through the segmental divisions. The subsegmental arteries are unopacified and not evaluated. 2. Mild cardiomegaly without venous distention or edema. Trace pleural effusions. 3. Trace aortic atherosclerosis, borderline aneurysmal aortic root and mildly ectatic ascending segment. Follow-up as indicated. 4. Mildly enlarged mediastinal and hilar nodes but they are less enlarged than previously suggesting the previous nodal enlargement was probably due to reactive hyperplasia. Follow-up chest CT in 12 months may be considered to ensure stability. 5. Evidence for lower lobe bronchitis with early emphysematous change in the apices. 6. Fatty liver. Electronically Signed   By: Telford Nab M.D.   On: 02/26/2022 05:09   DG CHEST PORT 1 VIEW  Result Date: 02/25/2022 CLINICAL DATA:  66 year old male with history of shortness of breath. EXAM: PORTABLE CHEST 1 VIEW COMPARISON:  Chest x-ray 02/23/2022. FINDINGS: Lung volumes are low. No consolidative airspace disease. No pleural effusions. No pneumothorax. No pulmonary nodule or mass noted. No evidence of pulmonary edema. Mild cardiomegaly. Upper mediastinal contours are within normal limits. IMPRESSION: 1. Low lung volumes without radiographic evidence of acute cardiopulmonary disease. 2. Mild cardiomegaly. Electronically Signed   By: Vinnie Langton M.D.   On: 02/25/2022 08:59   ECHOCARDIOGRAM COMPLETE  Result Date: 02/26/2022    ECHOCARDIOGRAM REPORT   Patient Name:   NICHALAS COIN Date of Exam: 02/26/2022 Medical Rec #:  [id number]       Height:       74.0 in Accession #:    [id number]      Weight:       360.4 lb Date of Birth:  [birth year]       BSA:          2.792 m Patient Age:    14 years        BP:           107/50 mmHg Patient Gender: M               HR:           62 bpm. Exam Location:  Inpatient Procedure: 2D Echo, Cardiac Doppler, Color Doppler and Intracardiac             Opacification Agent Indications:    Dyspnea  History:        Patient has no prior history of Echocardiogram examinations.                 Signs/Symptoms:Dyspnea; Risk Factors:Hypertension.  Sonographer:    Bernadene Person RDCS Referring Phys: [id number] [facility] LATIF [facility]  Sonographer Comments: Technically difficult study due to poor echo windows and patient is morbidly obese. IMPRESSIONS  1. Left ventricular ejection fraction, by estimation, is 60 to 65%. The left ventricle has normal function. The left ventricle has no regional wall motion abnormalities. Left ventricular diastolic parameters were normal.  2. Right ventricular systolic function is normal. The right ventricular size is normal. Tricuspid regurgitation signal is inadequate for assessing PA pressure.  3. The mitral valve is normal in structure. No evidence of mitral valve regurgitation. No evidence of mitral stenosis.  4. The aortic valve is  normal in structure. Aortic valve regurgitation is not visualized. No aortic stenosis is present.  5. There is mild dilatation of the aortic root, measuring 39 mm. There is mild dilatation of the ascending aorta, measuring 40 mm.  6. The inferior vena cava is normal in size with greater than 50% respiratory variability, suggesting right atrial pressure of 3 mmHg. FINDINGS  Left Ventricle: Left ventricular ejection fraction, by estimation, is 60 to 65%. The left ventricle has normal function. The left ventricle has no regional wall motion abnormalities. Definity contrast agent was given IV to delineate the left ventricular  endocardial borders. The left ventricular internal cavity size was normal in size. There is no left ventricular hypertrophy. Left ventricular diastolic parameters were normal. Normal left ventricular filling pressure. Right Ventricle: The right ventricular size is normal. No increase in right ventricular wall thickness. Right ventricular systolic function is normal. Tricuspid regurgitation  signal is inadequate for assessing PA pressure. Left Atrium: Left atrial size was normal in size. Right Atrium: Right atrial size was normal in size. Pericardium: There is no evidence of pericardial effusion. Mitral Valve: The mitral valve is normal in structure. No evidence of mitral valve regurgitation. No evidence of mitral valve stenosis. Tricuspid Valve: The tricuspid valve is normal in structure. Tricuspid valve regurgitation is not demonstrated. No evidence of tricuspid stenosis. Aortic Valve: The aortic valve is normal in structure. Aortic valve regurgitation is not visualized. No aortic stenosis is present. Pulmonic Valve: The pulmonic valve was normal in structure. Pulmonic valve regurgitation is trivial. No evidence of pulmonic stenosis. Aorta: The aortic root is normal in size and structure. There is mild dilatation of the aortic root, measuring 39 mm. There is mild dilatation of the ascending aorta, measuring 40 mm. Venous: The inferior vena cava is normal in size with greater than 50% respiratory variability, suggesting right atrial pressure of 3 mmHg. IAS/Shunts: No atrial level shunt detected by color flow Doppler.  LEFT VENTRICLE PLAX 2D LVIDd:         5.10 cm      Diastology LVIDs:         3.30 cm      LV e' medial:    9.25 cm/s LV PW:         1.10 cm      LV E/e' medial:  9.9 LV IVS:        1.10 cm      LV e' lateral:   12.20 cm/s LVOT diam:     2.20 cm      LV E/e' lateral: 7.5 LV SV:         75 LV SV Index:   27 LVOT Area:     3.80 cm  LV Volumes (MOD) LV vol d, MOD A2C: 79.7 ml LV vol d, MOD A4C: 112.0 ml LV vol s, MOD A2C: 27.3 ml LV vol s, MOD A4C: 41.1 ml LV SV MOD A2C:     52.4 ml LV SV MOD A4C:     112.0 ml LV SV MOD BP:      64.2 ml RIGHT VENTRICLE RV S prime:     13.60 cm/s TAPSE (M-mode): 2.7 cm LEFT ATRIUM           Index        RIGHT ATRIUM           Index LA diam:      3.80 cm 1.36 cm/m   RA Area:     21.90 cm LA Vol (A2C):  62.9 ml 22.53 ml/m  RA Volume:   66.70 ml  23.89 ml/m  LA Vol (A4C): 53.9 ml 19.31 ml/m  AORTIC VALVE             PULMONIC VALVE LVOT Vmax:   94.70 cm/s  PR End Diast Vel: 3.05 msec LVOT Vmean:  60.900 cm/s LVOT VTI:    0.197 m  AORTA Ao Root diam: 3.90 cm Ao Asc diam:  4.00 cm MITRAL VALVE MV Area (PHT): 2.42 cm    SHUNTS MV Decel Time: 313 msec    Systemic VTI:  0.20 m MV E velocity: 91.70 cm/s  Systemic Diam: 2.20 cm MV A velocity: 91.70 cm/s MV E/A ratio:  1.00 Fransico Him MD Electronically signed by Fransico Him MD Signature Date/Time: 02/26/2022/9:24:13 AM    Final     Scheduled Meds:  Chlorhexidine Gluconate Cloth  6 each Topical Daily   docusate sodium  100 mg Oral BID   heparin injection (subcutaneous)  5,000 Units Subcutaneous Q8H   ipratropium  0.5 mg Nebulization BID   lactulose  20 g Oral BID   levalbuterol  0.63 mg Nebulization BID   LORazepam  2 mg Intravenous Once   mouth rinse  15 mL Mouth Rinse BID   pregabalin  150 mg Oral Daily   QUEtiapine  50 mg Oral QHS   sodium chloride flush  3 mL Intravenous Q12H   Continuous Infusions:  sodium chloride     dexmedetomidine (PRECEDEX) IV infusion Stopped (02/26/22 0902)    LOS: 7 days   Raiford Noble, DO [facility] Available via Epic secure chat 7am-7pm After these hours, please refer to coverage provider listed on [email or web address] 02/26/2022, 7:07 PM

## 2022-02-26 NOTE — Assessment & Plan Note (Signed)
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 No aortic dissection or penetrating ulcer." ?-Outpatient Follow up with Vascular and Repeat Imaging  ?

## 2022-02-26 NOTE — Progress Notes (Signed)
? ?NAME:  Kenneth Davila, MRN:  [id number], DOB:  12-Feb-1956, LOS: 7 ?ADMISSION DATE:  02/19/2022, CONSULTATION DATE:  02/25/2022 ?REFERRING MD:  Dr. Alfredia Ferguson, [facility], CHIEF COMPLAINT:  Agitation  ? ?History of Present Illness:  ?66 yo male with lumbar stenosis and claudication admitted for lumbar laminectomy done on 02/19/22.  He has hx of developed lethargy and hypoxia on 02/21/22.  ABG showed acute on chronic hypercapnia.  He was started on Bipap.  He was found to have elevated ammonia level of 57 also, and started on lactulose.  He developed agitation and confusion, and had difficulty keeping medical equipment on.  PCCM asked to assist with management. ? ?Pertinent  Medical History  ?Anxiety, OA, Asthma, Colon polyps, COVID, Depression, Headache, GERD, HTN, Pre DM, OSA  ? ?Significant Hospital Events: ?Including procedures, antibiotic start and stop dates in addition to other pertinent events   ?3/06 lumbar laminectomy, foley placed by urology ?3/08 start on Bipap ?3/10 hospitalist consulted ? ?Interim History / Subjective:  ?Patient wore BiPAP only 4 hours at night ?This morning he was found lethargic, snoring on nasal cannula oxygen ?He was placed on BiPAP, Precedex infusion was stopped with improvement in mental status ? ?Objective   ?Blood pressure (!) 115/52, pulse 72, temperature 97.8 ?F (36.6 ?C), temperature source Axillary, resp. rate 16, height '6\' 2"'$  (1.88 m), weight (!) 163.5 kg, SpO2 93 %. ?   ?FiO2 (%):  [40 %] 40 %  ? ?Intake/Output Summary (Last 24 hours) at 02/26/2022 1431 ?Last data filed at 02/26/2022 0757 ?Gross per 24 hour  ?Intake 244.94 ml  ?Output 2370 ml  ?Net -2125.06 ml  ? ?Filed Weights  ? 02/19/22 0603 02/25/22 1730  ?Weight: (!) 170.1 kg (!) 163.5 kg  ? ? ?Examination: ?Physical exam: ?General: Acute on chronically ill-appearing morbidly obese male, lying on the bed ?HEENT: /AT, eyes anicteric.  moist mucus membranes ?Neuro: Lethargic, opens eyes with vocal stimuli, following simple  commands ?Chest: Coarse breath sounds, no wheezes or rhonchi ?Heart: Regular rate and rhythm, no murmurs or gallops ?Abdomen: Soft, nontender, nondistended, bowel sounds present ?Skin: No rash ? ? ?Resolved Hospital Problem list   ? ? ?Assessment & Plan:  ? ?Acute on chronic hypoxic, hypercapnic respiratory failure. 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 ?Acute metabolic encephalopathy 2nd to hypoxia/hypercapnia. ?Mental status has improved after using BiPAP ?Avoid sedation ? ?Elevated D dimer. ?PE was ruled out with negative CT angiogram study ? ?S/p lumbar laminectomy. ?Post op care per neurosurgery ? ?Acute urine retention. ?Foley placed by urology on 3/06 ?Completed Keflex for 5 days per urology recommendations ?He will have voiding trial as an outpatient with urology ? ?Elevated ammonia level. ?Continue lactulose ? ?DM type 2. ?Fingersticks are at goal ?Continue sliding scale insulin ? ?Best Practice (right click and "Reselect all SmartList Selections" daily)  ? ?Diet/type: Regular consistency (see orders) ?DVT prophylaxis: prophylactic heparin  ?GI prophylaxis: N/A ?Lines: N/A ?Foley:  Yes, and it is still needed ?Code Status:  full code ?Last date of multidisciplinary goals of care discussion '[x]'$  ? ?Labs   ?CBC: ?Recent Labs  ?Lab 02/23/22 ?1644 02/23/22 ?1730 02/24/22 ?4010  02/25/22 ?0143 02/26/22 ?0500  ?WBC  --  6.0 5.9 6.2 5.2  ?NEUTROABS  --  3.7 3.8 4.3 3.3  ?HGB 12.9* 12.0* 11.8* 11.1* 11.4*  ?HCT 38.0* 39.6 38.4* 36.2* 37.2*  ?MCV  --  87.2 87.9 87.7 86.7  ?PLT  --  173 170 158 178  ? ? ?Basic Metabolic Panel: ?Recent Labs  ?Lab 02/23/22 ?0823 02/23/22 ?1644 02/23/22 ?1730  02/24/22 ?0351 02/25/22 ?0143 02/26/22 ?0500  ?NA 134* 135 135 134* 135 138  ?K 4.1 4.2 4.1 3.9 3.9 4.1  ?CL 94*  --  97* 96* 94* 95*  ?CO2 28  --  30 32 33* 34*  ?GLUCOSE 118*  --  108* 114* 108* 118*  ?BUN 23  --  '21 18 14 14  '$ ?CREATININE 1.30*  --  1.29* 1.21 1.15 1.20  ?CALCIUM 8.7*  --  8.7* 8.7* 8.6* 8.9  ?MG  --   --  2.4 2.4 1.9 1.8  ?PHOS 1.9*  --  2.3* 2.4* 2.0* 2.7  ? ?GFR: ?Estimated Creatinine Clearance: 102.2 mL/min (by C-G formula based on SCr of 1.2 mg/dL). ?Recent Labs  ?Lab 02/23/22 ?1730 02/23/22 ?2022 02/24/22 ?0351 02/25/22 ?0143 02/26/22 ?0500  ?PROCALCITON 0.43  --  0.34 0.59  --   ?WBC 6.0  --  5.9 6.2 5.2  ?LATICACIDVEN 0.9 0.8  --   --   --   ? ? ?Liver Function Tests: ?Recent Labs  ?Lab 02/23/22 ?0823 02/23/22 ?1730 02/24/22 ?0351 02/25/22 ?0143 02/26/22 ?0500  ?AST  --  124* 96* 65* 49*  ?ALT  --  60* 55* 50* 47*  ?ALKPHOS  --  62 83 66 29  ?BILITOT  --  0.6 0.5 0.9 0.7  ?PROT  --  6.7 6.6 6.3* 6.3*  ?ALBUMIN 3.3* 3.2* 3.1* 2.9* 2.8*  ? ?No results for input(s): LIPASE, AMYLASE in the last 168 hours. ?Recent Labs  ?Lab 02/23/22 ?1638 02/24/22 ?1000 02/25/22 ?1441  ?AMMONIA 57* 46* 25  ? ? ?ABG ?   ?Component Value Date/Time  ? PHART 7.4 02/25/2022 1345  ? PCO2ART 62 (H) 02/25/2022 1345  ? PO2ART 114 (H) 02/25/2022 1345  ? HCO3 38.4 (H) 02/25/2022 1345  ? TCO2 37 (H) 02/23/2022 1644  ? O2SAT 98 02/25/2022 1345  ?  ? ?Coagulation Profile: ?No results for input(s): INR, PROTIME in the last 168 hours. ? ?Cardiac Enzymes: ?No results for input(s): CKTOTAL, CKMB, CKMBINDEX, TROPONINI in the last 168 hours. ? ?HbA1C: ?Hemoglobin A1C  ?Date/Time Value Ref Range Status  ?10/13/2018 01:52 PM 5.8 (A) 4.0 - 5.6 % Final  ?07/03/2018 02:47 PM 5.9 (A) 4.0 - 5.6 % Final  ? ?Hgb A1c MFr Bld  ?Date/Time Value Ref Range Status  ?12/22/2019 07:05 AM 6.4 (H) 4.8 - 5.6 % Final  ?  Comment:  ?  REPEATED TO VERIFY ?(NOTE) ?Pre diabetes:          5.7%-6.4% ?Diabetes:              >6.4% ?Glycemic control for    <7.0% ?adults with diabetes ?  ?02/06/2016 10:00 AM 5.8 (H) 4.8 - 5.6 % Final  ?  Comment:  ?  (NOTE) ?        Pre-diabetes: 5.7 - 6.4 ?        Diabetes: >6.4 ?        Glycemic control for adults with diabetes: <7.0 ?  ? ? ?CBG: ?Recent Labs  ?Lab 02/21/22 ?2008  ?GLUCAP 127*  ? ? ?Critical  care time:   ? ?Total critical care time: 32 minutes ? ?Performed by: Jacky Kindle ?  ?Critical care time was exclusive of separately billable procedures and treating other patients. ?  ?Critical care was necessary to treat or prevent imminent or life-threatening deterioration. ?  ?Critical care was time spent personally by me on the following activities: development of treatment plan with patient and/or surrogate as well as nursing, discussions with consultants, evaluation of patient's response to treatment, examination of patient, obtaining history from patient or surrogate, ordering and performing treatments and interventions, ordering and review of laboratory studies, ordering and review of radiographic studies, pulse oximetry and re-evaluation of patient's condition. ?  ?Jacky Kindle MD ?[facility] ?See Amion for pager ?If no response to pager, please call [phone] until 7pm ?After 7pm, Please call E-link [phone] ? ? ? ? ? ?

## 2022-02-26 NOTE — Assessment & Plan Note (Signed)
-  CTA showed "Mildly enlarged mediastinal and hilar nodes but they are less enlarged than previously suggesting the previous nodal enlargement was probably due to reactive hyperplasia".  ?-Follow-up chest CT in 12 ?months may be considered to ensure stability. ?

## 2022-02-26 NOTE — Progress Notes (Signed)
?  Echocardiogram ?2D Echocardiogram has been performed. ? ?Kenneth Davila ?02/26/2022, 9:13 AM ?

## 2022-02-26 NOTE — Progress Notes (Signed)
Neurosurgery Service ?Progress Note ? ?Subjective: No acute events overnight, actually doing fairly well this morning by the time I rounded - off precedex, off BiPAP, on [place], back pain tolerable, watching a show about the [facility] on his phone ? ?Objective: ?Vitals:  ? 02/26/22 0800 02/26/22 0900 02/26/22 0915 02/26/22 0940  ?BP: (!) 95/44 (!) 115/52 (!) 115/52   ?Pulse: 64 64 72   ?Resp: 13 (!) 24 16   ?Temp: 97.8 ?F (36.6 ?C)     ?TempSrc: Axillary     ?SpO2: 96% 97% 92% 93%  ?Weight:      ?Height:      ? ? ?Physical Exam: ?Aox3, Strength 5/5 ?SILTx4 except bilateral stocking numbness  ? ?Assessment & Plan: ?66 y.o. man s/p 2 level MIS decompression and TLIF, recovering well. ? ?-medicine, PCCM recs for acute on chronic hypercarbic respiratory failure, AKI, acute metabolic encephalopathy ?-activity as tolerated, no brace needed ?-foley has been in 1 week, likely d/c tomorrow ?-SCDs/TEDs, SQH ? ?Kenneth Davila  ?02/26/22 ?11:00 AM ? ?

## 2022-02-26 NOTE — Progress Notes (Signed)
Physical Therapy Treatment ?Patient Details ?Name: Kenneth Davila ?MRN: [id number] ?DOB: Feb 16, 1956 ?Today's Date: 02/26/2022 ? ? ?History of Present Illness Pt is 66 yo male who was suffering from claudication pain with progressive foot weakness and incontinence. Presented for TLIF L4-5, L5-S1. PMH: GERD, HTN, asth,a. arthritis, anxiety and depression. ? ?  ?PT Comments  ? ? Pt required mod assist with bed mobility to get sitting EOB. Was able to stand with min assist with help on either side initially but was able to progress to standing up with supervision and heavy use of UE on walker and handicap bars in the bathroom. Pt performed sit to stands throughout the session and AROM of extremities during cleaning. Pt reported fatigue with activity. Pt will require reinforcement in future sessions for his back precautions. During treatment session patient showed deficits in strength, endurance, activity tolerance. Recommending therapy services at skilled nursing facility to address the previously stated deficits. Will continue to follow acutely to maximize functional mobility, independence, and safety. ?  ?Recommendations for follow up therapy are one component of a multi-disciplinary discharge planning process, led by the attending physician.  Recommendations may be updated based on patient status, additional functional criteria and insurance authorization. ? ?Follow Up Recommendations ? Skilled nursing-short term rehab (<3 hours/day) ?  ?  ?Assistance Recommended at Discharge Frequent or constant Supervision/Assistance  ?Patient can return home with the following Assistance with cooking/housework;Assist for transportation;Help with stairs or ramp for entrance;A lot of help with walking and/or transfers;A lot of help with bathing/dressing/bathroom;Direct supervision/assist for medications management ?  ?Equipment Recommendations ? Wheelchair (measurements PT);Wheelchair cushion (measurements PT);Other (comment);Hospital  bed  ?  ?Recommendations for Other Services   ? ? ?  ?Precautions / Restrictions Precautions ?Precautions: Back ?Precaution Booklet Issued: Yes (comment) ?Precaution Comments: pt was educated on back precautions and was able to verbalize them. Will need reinforcement to adhere to precautions ?Restrictions ?Weight Bearing Restrictions: No  ?  ? ?Mobility ? Bed Mobility ?Overal bed mobility: Needs Assistance ?Bed Mobility: Sit to Supine ?Rolling: Mod assist ?Sidelying to sit: Mod assist ?  ?  ?  ?General bed mobility comments: Pt required physical assistance and step by step cuing to get to sitting EOB ?  ? ?Transfers ?Overall transfer level: Needs assistance ?Equipment used: Rolling walker (2 wheels) ?Transfers: Sit to/from Stand ?Sit to Stand: Min assist, +2 safety/equipment ?  ?  ?  ?  ?  ?General transfer comment: Pt initally required min assist for sit to stand but was able to progress to sit to stand with supervision up and down off the toilet with heavy use of the handicap grab bar and walker. Pt performed 17 sit to stands throughout the session, one from bed and 16 from toilet. ?  ? ?Ambulation/Gait ?Ambulation/Gait assistance: Min assist ?Gait Distance (Feet): 8 Feet (x2 (to bathroom)) ?Assistive device: Rolling walker (2 wheels) ?Gait Pattern/deviations: Step-through pattern, Decreased stride length ?Gait velocity: reduced ?  ?  ?General Gait Details: Pt requried min assist for balance at times during gait for balance and hip positioning. ? ? ?Stairs ?  ?  ?  ?  ?  ? ? ?Wheelchair Mobility ?  ? ?Modified Rankin (Stroke Patients Only) ?  ? ? ?  ?Balance Overall balance assessment: Needs assistance ?Sitting-balance support: Single extremity supported, Bilateral upper extremity supported, Feet supported ?Sitting balance-Leahy Scale: Fair ?Sitting balance - Comments: Reliant on RW for sitting balance. Able to sit on comode with supervision during cleaning. ?  ?Standing  balance support: Bilateral upper extremity  supported, Reliant on assistive device for balance ?Standing balance-Leahy Scale: Poor ?Standing balance comment: Dependent on walker for balance. Had one loss of balance during backwards ambulation to recliner. required min assist for recovery. ?  ?  ?  ?  ?  ?  ?  ?  ?  ?  ?  ?  ? ?  ?Cognition Arousal/Alertness: Awake/alert ?Behavior During Therapy: Impulsive ?Overall Cognitive Status: Impaired/Different from baseline ?Area of Impairment: Memory, Following commands, Safety/judgement ?  ?  ?  ?  ?  ?  ?  ?  ?Orientation Level: Situation, Person, Place, Time ?Current Attention Level: Focused ?Memory: Decreased recall of precautions ?Following Commands: Follows one step commands consistently ?Safety/Judgement: Decreased awareness of safety ?Awareness: Intellectual ?Problem Solving: Slow processing, Requires verbal cues ?General Comments: Was not able to recall back percautions, was able to repeat them back after being told. He did not demonstrate understanding precautions. ?  ?  ? ?  ?Exercises General Exercises - Lower Extremity ?Ankle Circles/Pumps: AROM, Both, 10 reps, Other (comment) (Educated to perform throughout the day.) ? ?  ?General Comments General comments (skin integrity, edema, etc.): Pt was on 5L of supplemental O2 during session. vitals were stable throughout session. Pt was educated on the importance of moving throughout the day, the procedure that he underwent, why he is having some of the symptoms that has been having, and back precautions and why they are important. Therapist assisted with bathroom hygine during session. ?  ?  ? ?Pertinent Vitals/Pain Pain Assessment ?Pain Assessment: Faces ?Faces Pain Scale: Hurts a little bit ?Pain Location: Pt notes discomfort, sensitivitey, and cold sensation in his feet. ?Pain Descriptors / Indicators: Tingling, Discomfort  ? ? ?Home Living   ?  ?  ?  ?  ?  ?  ?  ?  ?  ?   ?  ?Prior Function    ?  ?  ?   ? ?PT Goals (current goals can now be found in the  care plan section)   ? ?  ?Frequency ? ? ? Min 5X/week ? ? ? ?  ?PT Plan Current plan remains appropriate  ? ? ?Co-evaluation   ?  ?  ?  ?  ? ?  ?AM-PAC PT "6 Clicks" Mobility   ?Outcome Measure ? Help needed turning from your back to your side while in a flat bed without using bedrails?: A Lot ?Help needed moving from lying on your back to sitting on the side of a flat bed without using bedrails?: A Lot ?Help needed moving to and from a bed to a chair (including a wheelchair)?: A Little ?Help needed standing up from a chair using your arms (e.g., wheelchair or bedside chair)?: A Little ?Help needed to walk in hospital room?: A Little ?Help needed climbing 3-5 steps with a railing? : Total ?6 Click Score: 14 ? ?  ?End of Session Equipment Utilized During Treatment: Oxygen ?Activity Tolerance: Patient tolerated treatment well ?Patient left: in chair;with call bell/phone within reach;with restraints reapplied ?Nurse Communication: Mobility status ?PT Visit Diagnosis: Unsteadiness on feet (R26.81);Muscle weakness (generalized) (M62.81);Difficulty in walking, not elsewhere classified (R26.2);Pain ?Pain - Right/Left: Left ?Pain - part of body: Leg ?  ? ? ?Time: 2426-8341 ?PT Time Calculation (min) (ACUTE ONLY): 53 min ? ?Charges:  $Therapeutic Activity: 23-37 mins ?$Self Care/Home Management: 8-22          ?          ? ?  Quenton Fetter, SPT ? ? ? ?Quenton Fetter ?02/26/2022, 2:06 PM ? ?

## 2022-02-26 NOTE — TOC Progression Note (Signed)
Transition of Care (TOC) - Progression Note  ? ? ?Patient Details  ?Name: Kenneth Davila ?MRN: [id number] ?Date of Birth: 1956-01-31 ? ?Transition of Care (TOC) CM/SW Contact  ?Benard Halsted, LCSW ?Phone Number: ?02/26/2022, 12:24 PM ? ?Clinical Narrative:    ?Patient now in ICU. CSW spoke with Maudie Mercury at [facility] and she will review patient again once medically stable to see which of their facilities will have availability at time of discharge.  ? ? ?Expected Discharge Plan: [place] ?Barriers to Discharge: Ship broker, Continued Medical Work up, SNF Pending bed offer (obesity) ? ?Expected Discharge Plan and Services ?Expected Discharge Plan: [place] ?In-house Referral: Clinical Social Work ?  ?  ?  ?                ?  ?  ?  ?  ?  ?  ?  ?  ?  ?  ? ? ?Social Determinants of Health (SDOH) Interventions ?  ? ?Readmission Risk Interventions ?No flowsheet data found. ? ?

## 2022-02-26 NOTE — Progress Notes (Signed)
Pharmacy Electrolyte Replacement ? ?Recent Labs: ? ?Recent Labs  ?  02/26/22 ?0500  ?K 4.1  ?MG 1.8  ?PHOS 2.7  ?CREATININE 1.20  ? ? ?Low Critical Values (K </= 2.5, Phos </= 1, Mg </= 1) Present: ?None ? ?MD Contacted: none - no critical values noted ? ?Plan: Mag sulfate 2g IV x 1 ?Recheck labs as needed ? ? ?Arturo Morton, PharmD, BCPS ?Please check AMION for all [place] contact numbers ?Clinical Pharmacist ?02/26/2022 2:30 PM ? ?

## 2022-02-26 NOTE — Progress Notes (Signed)
Placed on BIPAP for HS use. ?

## 2022-02-27 DIAGNOSIS — N179 Acute kidney failure, unspecified: Secondary | ICD-10-CM | POA: Diagnosis not present

## 2022-02-27 DIAGNOSIS — G894 Chronic pain syndrome: Secondary | ICD-10-CM

## 2022-02-27 DIAGNOSIS — J9601 Acute respiratory failure with hypoxia: Secondary | ICD-10-CM | POA: Diagnosis not present

## 2022-02-27 DIAGNOSIS — E119 Type 2 diabetes mellitus without complications: Secondary | ICD-10-CM | POA: Diagnosis not present

## 2022-02-27 LAB — CBC WITH DIFFERENTIAL/PLATELET
Abs Immature Granulocytes: 0.05 10*3/uL (ref 0.00–0.07)
Basophils Absolute: 0 10*3/uL (ref 0.0–0.1)
Basophils Relative: 1 %
Eosinophils Absolute: 0.2 10*3/uL (ref 0.0–0.5)
Eosinophils Relative: 4 %
HCT: 36.9 % — ABNORMAL LOW (ref 39.0–52.0)
Hemoglobin: 11.5 g/dL — ABNORMAL LOW (ref 13.0–17.0)
Immature Granulocytes: 1 %
Lymphocytes Relative: 17 %
Lymphs Abs: 1 10*3/uL (ref 0.7–4.0)
MCH: 26.7 pg (ref 26.0–34.0)
MCHC: 31.2 g/dL (ref 30.0–36.0)
MCV: 85.8 fL (ref 80.0–100.0)
Monocytes Absolute: 0.7 10*3/uL (ref 0.1–1.0)
Monocytes Relative: 13 %
Neutro Abs: 3.6 10*3/uL (ref 1.7–7.7)
Neutrophils Relative %: 64 %
Platelets: 208 10*3/uL (ref 150–400)
RBC: 4.3 MIL/uL (ref 4.22–5.81)
RDW: 12.9 % (ref 11.5–15.5)
WBC: 5.6 10*3/uL (ref 4.0–10.5)
nRBC: 0 % (ref 0.0–0.2)

## 2022-02-27 LAB — COMPREHENSIVE METABOLIC PANEL
ALT: 51 U/L — ABNORMAL HIGH (ref 0–44)
AST: 44 U/L — ABNORMAL HIGH (ref 15–41)
Albumin: 2.8 g/dL — ABNORMAL LOW (ref 3.5–5.0)
Alkaline Phosphatase: 62 U/L (ref 38–126)
Anion gap: 7 (ref 5–15)
BUN: 12 mg/dL (ref 8–23)
CO2: 34 mmol/L — ABNORMAL HIGH (ref 22–32)
Calcium: 8.7 mg/dL — ABNORMAL LOW (ref 8.9–10.3)
Chloride: 95 mmol/L — ABNORMAL LOW (ref 98–111)
Creatinine, Ser: 1.3 mg/dL — ABNORMAL HIGH (ref 0.61–1.24)
GFR, Estimated: >60 mL/min (ref >60)
Glucose, Bld: 126 mg/dL — ABNORMAL HIGH (ref 70–99)
Potassium: 3.4 mmol/L — ABNORMAL LOW (ref 3.5–5.1)
Sodium: 136 mmol/L (ref 135–145)
Total Bilirubin: 0.5 mg/dL (ref 0.3–1.2)
Total Protein: 6.1 g/dL — ABNORMAL LOW (ref 6.5–8.1)

## 2022-02-27 LAB — PHOSPHORUS: Phosphorus: 4.4 mg/dL (ref 2.5–4.6)

## 2022-02-27 LAB — MAGNESIUM: Magnesium: 2 mg/dL (ref 1.7–2.4)

## 2022-02-27 MED ORDER — POTASSIUM CHLORIDE CRYS ER 20 MEQ PO TBCR: 40.0000 meq | EXTENDED_RELEASE_TABLET | Freq: Two times a day (BID) | ORAL | Status: DC

## 2022-02-27 MED ORDER — SODIUM CHLORIDE 0.9 % IV SOLN: INTRAVENOUS | Status: DC

## 2022-02-27 MED ORDER — POTASSIUM CHLORIDE CRYS ER 20 MEQ PO TBCR
40.0000 meq | EXTENDED_RELEASE_TABLET | Freq: Two times a day (BID) | ORAL | Status: AC
  Administered 2022-02-27 (×2): 40 meq via ORAL
  Filled 2022-02-27 (×2): qty 2

## 2022-02-27 NOTE — TOC Progression Note (Addendum)
Transition of Care (TOC) - Progression Note  ? ? ?Patient Details  ?Name: Kenneth Davila ?MRN: [id number] ?Date of Birth: 01/01/1956 ? ?Transition of Care (TOC) CM/SW Contact  ?Benard Halsted, LCSW ?Phone Number: ?02/27/2022, 3:16 PM ? ?Clinical Narrative:    ?CSW spoke with Maudie Mercury at [facility] and made her aware that patient is likely moving out of the ICU. She stated she will speak with her nursing team and figure out patient's needs to determine which of their buildings can accommodate him. CSW also provided her with Bipap settings since facility would need to order one for patient. Once bed offer obtained, will present to patient.  ? ? ?Expected Discharge Plan: [place] ?Barriers to Discharge: Ship broker, Continued Medical Work up, SNF Pending bed offer (obesity) ? ?Expected Discharge Plan and Services ?Expected Discharge Plan: [place] ?In-house Referral: Clinical Social Work ?  ?  ?  ?                ?  ?  ?  ?  ?  ?  ?  ?  ?  ?  ? ? ?Social Determinants of Health (SDOH) Interventions ?  ? ?Readmission Risk Interventions ?No flowsheet data found. ? ?

## 2022-02-27 NOTE — Progress Notes (Signed)
Physical Therapy Treatment ?Patient Details ?Name: Kenneth Davila ?MRN: [id number] ?DOB: [birth year] ?Today's Date: 02/27/2022 ? ? ?History of Present Illness Pt is 66 yo male who was suffering from claudication pain with progressive foot weakness and incontinence. Presented for TLIF L4-5, L5-S1. PMH: GERD, HTN, asth,a. arthritis, anxiety and depression. ? ?  ?PT Comments  ? ? Pt was min assist for bed mobility to get sitting EOB. He demonstrated improved endurance during today's session. He required rest breaks and increased O2 during ambulation indicating endurance deficits. With the be elevated he was able to stand with min guard and UE supported on walker. During treatment session patient showed deficits in strength, endurance, activity tolerance. Recommending therapy services at skill nursing facility to address the previously stated deficits. Will continue to follow acutely to maximize functional mobility, independence and safety. ?  ?Recommendations for follow up therapy are one component of a multi-disciplinary discharge planning process, led by the attending physician.  Recommendations may be updated based on patient status, additional functional criteria and insurance authorization. ? ?Follow Up Recommendations ? Skilled nursing-short term rehab (<3 hours/day) ?  ?  ?Assistance Recommended at Discharge Frequent or constant Supervision/Assistance  ?Patient can return home with the following Assistance with cooking/housework;Assist for transportation;Help with stairs or ramp for entrance;A lot of help with walking and/or transfers;A lot of help with bathing/dressing/bathroom;Direct supervision/assist for medications management ?  ?Equipment Recommendations ? None recommended by PT  ?  ?Recommendations for Other Services   ? ? ?  ?Precautions / Restrictions Precautions ?Precautions: Back ?Precaution Booklet Issued: Yes (comment) ?Precaution Comments: pt was able to recall 2/3 back  precautions. ?Restrictions ?Weight Bearing Restrictions: No  ?  ? ?Mobility ? Bed Mobility ?Overal bed mobility: Needs Assistance ?Bed Mobility: Sidelying to Sit, Sit to Sidelying ?Rolling: Min assist ?Sidelying to sit: Min assist ?  ?  ?  ?General bed mobility comments: Pt require assistance to get upright on EOB ?  ? ?Transfers ?Overall transfer level: Needs assistance ?Equipment used: Rolling walker (2 wheels) ?Transfers: Sit to/from Stand ?Sit to Stand: Min guard ?  ?  ?  ?  ?  ?General transfer comment: Pt was min guard to stand up from elevated EOB. He used his UE on walker to help him stand. ?  ? ?Ambulation/Gait ?Ambulation/Gait assistance: Min guard ?Gait Distance (Feet): 80 Feet (x1, and 157f x2. with seated rest breaks between.) ?Assistive device: Rolling walker (2 wheels) ?Gait Pattern/deviations: Step-through pattern, Decreased stride length ?  ?  ?  ?General Gait Details: Pt requried min guard for balance during ambuation and help with line managment and supplemental O2 tank. ? ? ?Stairs ?  ?  ?  ?  ?  ? ? ?Wheelchair Mobility ?  ? ?Modified Rankin (Stroke Patients Only) ?  ? ? ?  ?Balance Overall balance assessment: Needs assistance ?Sitting-balance support: Bilateral upper extremity supported, Feet supported ?Sitting balance-Leahy Scale: Fair ?Sitting balance - Comments: Reliant on RW for sitting balance. ?  ?Standing balance support: Bilateral upper extremity supported, Reliant on assistive device for balance, During functional activity ?Standing balance-Leahy Scale: Fair ?Standing balance comment: Dependent on walker for balance. Static stan ?  ?  ?  ?  ?  ?  ?  ?  ?  ?  ?  ?  ? ?  ?Cognition Arousal/Alertness: Awake/alert ?[facility]formed ?Overall Cognitive Status: Within Functional Limits for tasks assessed ?  ?  ?  ?  ?  ?  ?  ?  ?  ?  ?  ?  ?  ?  ?  ?  ?  ?  ?  ? ?  ?  Exercises   ? ?  ?General Comments General comments (skin integrity, edema, etc.): Pt  was on 5L of supplemental O2 initailly. During last walk attempt pt was increased to 6L, then returned to 5L upon leaving the room. ?  ?  ? ?Pertinent Vitals/Pain Pain Assessment ?Pain Assessment: Faces ?Faces Pain Scale: Hurts a little bit ?Pain Location: low back ?Pain Descriptors / Indicators: Tingling, Discomfort, Aching  ? ? ?Home Living   ?  ?  ?  ?  ?  ?  ?  ?  ?  ?   ?  ?Prior Function    ?  ?  ?   ? ?PT Goals (current goals can now be found in the care plan section)   ? ?  ?Frequency ? ? ? Min 5X/week ? ? ? ?  ?PT Plan Current plan remains appropriate  ? ? ?Co-evaluation   ?  ?  ?  ?  ? ?  ?AM-PAC PT "6 Clicks" Mobility   ?Outcome Measure ? Help needed turning from your back to your side while in a flat bed without using bedrails?: A Little ?Help needed moving from lying on your back to sitting on the side of a flat bed without using bedrails?: A Lot ?Help needed moving to and from a bed to a chair (including a wheelchair)?: A Little ?Help needed standing up from a chair using your arms (e.g., wheelchair or bedside chair)?: A Little ?Help needed to walk in hospital room?: A Little ?Help needed climbing 3-5 steps with a railing? : Total ?6 Click Score: 15 ? ?  ?End of Session Equipment Utilized During Treatment: Oxygen ?Activity Tolerance: Patient tolerated treatment well ?Patient left: in chair;with call bell/phone within reach ?  ?PT Visit Diagnosis: Unsteadiness on feet (R26.81);Muscle weakness (generalized) (M62.81);Difficulty in walking, not elsewhere classified (R26.2);Pain ?  ? ? ?Time: 4174-0814 ?PT Time Calculation (min) (ACUTE ONLY): 40 min ? ?Charges:  $Gait Training: 8-22 mins ?$Therapeutic Activity: 23-37 mins          ?          ? ?Quenton Fetter, SPT ? ? ? ?Quenton Fetter ?02/27/2022, 4:38 PM ? ?

## 2022-02-27 NOTE — Progress Notes (Signed)
Neurosurgery Service ?Progress Note ? ?Subjective: No acute events overnight, continues to overall do well ? ?Objective: ?Vitals:  ? 02/27/22 0400 02/27/22 0415 02/27/22 0500 02/27/22 0600  ?BP: (!) 148/75  (!) 141/65 128/61  ?Pulse: 97 (!) 101 96 97  ?Resp: '15 15 18 15  '$ ?Temp: 98.5 ?F (36.9 ?C)     ?TempSrc: Oral     ?SpO2: 95% 96% 96% 97%  ?Weight:    (!) 163.2 kg  ?Height:      ? ? ?Physical Exam: ?Aox3, Strength 5/5 ?SILTx4 except bilateral stocking numbness  ? ?Assessment & Plan: ?66 y.o. man s/p 2 level MIS decompression and TLIF, recovering well. ? ?-medicine, PCCM recs for acute on chronic hypercarbic respiratory failure, AKI, acute metabolic encephalopathy ?-activity as tolerated, no brace needed ?-d/c foley today ?-SCDs/TEDs, SQH ? ?Kenneth Davila  ?02/27/22 ?8:00 AM ? ?

## 2022-02-27 NOTE — Telephone Encounter (Signed)
Patient is scheduled for a HFU on 03/29/2022 at 10:30am with Dr. Verlee Monte- reminder mailed to patient. Nothing further needed. ?

## 2022-02-27 NOTE — Progress Notes (Signed)
? ?NAME:  Kenneth Davila, MRN:  [id number], DOB:  [birth year], LOS: 8 ?ADMISSION DATE:  02/19/2022, CONSULTATION DATE:  02/25/2022 ?REFERRING MD:  Dr. Alfredia Ferguson, [facility], CHIEF COMPLAINT:  Agitation  ? ?History of Present Illness:  ?66 yo male with lumbar stenosis and claudication admitted for lumbar laminectomy done on 02/19/22.  He has hx of developed lethargy and hypoxia on 02/21/22.  ABG showed acute on chronic hypercapnia.  He was started on Bipap.  He was found to have elevated ammonia level of 57 also, and started on lactulose.  He developed agitation and confusion, and had difficulty keeping medical equipment on.  PCCM asked to assist with management. ? ?Pertinent  Medical History  ?Anxiety, OA, Asthma, Colon polyps, COVID, Depression, Headache, GERD, HTN, Pre DM, OSA  ? ?Significant Hospital Events: ?Including procedures, antibiotic start and stop dates in addition to other pertinent events   ?3/06 lumbar laminectomy, foley placed by urology ?3/08 start on Bipap ?3/10 hospitalist consulted ? ?Interim History / Subjective:  ?Patient wore BiPAP overnight, switched to nasal cannula oxygen this morning ?He is awake and alert ?Remains off Precedex for 24 hours ? ?Objective   ?Blood pressure (!) 147/70, pulse 93, temperature 98.3 ?F (36.8 ?C), temperature source Oral, resp. rate 15, height '6\' 2"'$  (1.88 m), weight (!) 163.2 kg, SpO2 96 %. ?   ?FiO2 (%):  [40 %] 40 %  ? ?Intake/Output Summary (Last 24 hours) at 02/27/2022 1051 ?Last data filed at 02/27/2022 0800 ?Gross per 24 hour  ?Intake 1068.25 ml  ?Output 1750 ml  ?Net -681.75 ml  ? ?Filed Weights  ? 02/19/22 0603 02/25/22 1730 02/27/22 0600  ?Weight: (!) 170.1 kg (!) 163.5 kg (!) 163.2 kg  ? ? ?Examination: ?Physical exam: ?General: Acute on chronically ill-appearing morbidly obese male, lying on the bed ?HEENT: [place]/AT, eyes anicteric.  moist mucus membranes ?Neuro: Alert, awake, following commands, moving all 4 extremities ?Chest: Coarse breath sounds, no wheezes or  rhonchi ?Heart: Regular rate and rhythm, no murmurs or gallops ?Abdomen: Soft, nontender, nondistended, bowel sounds present ?Skin: No rash ? ? ?Resolved Hospital Problem list   ? ? ?Assessment & Plan:  ? ?Acute on chronic hypoxic, hypercapnic respiratory failure. ?Likely from OSA/OHS exacerbated by opiate pain medication use after surgery ?His sleep study from 11/26/20 showed very, very severe sleep apnea with an AHI of 121 and SpO2 low of 51% ?Continue BiPAP while he is at sleep and switch to nasal cannula oxygen during daytime ?Counseling provided to use CPAP at every night to prevent hypercapnia and heart failure ?Titrate FiO2 with goal O2 sat above 92% ?Limit opiate as much as possible ? ?Acute metabolic encephalopathy 2nd to hypoxia/hypercapnia. ?Mental status has improved significantly ?Avoid deep sedation ? ?S/p lumbar laminectomy. ?Post op care per neurosurgery ? ?Acute urine retention. ?Foley placed by urology on 3/06 ?Completed Keflex for 5 days per urology recommendations ?Voiding trial per urology ? ?Elevated ammonia level. ?Continue lactulose ? ?DM type 2. ?Fingersticks are at goal ?Continue sliding scale insulin ? ?Best Practice (right click and "Reselect all SmartList Selections" daily)  ? ?Diet/type: Regular consistency (see orders) ?DVT prophylaxis: prophylactic heparin  ?GI prophylaxis: N/A ?Lines: N/A ?Foley:  Yes, and it is still needed ?Code Status:  full code ?Last date of multidisciplinary goals of care discussion '[x]'$  ? ?Labs   ?CBC: ?Recent Labs  ?Lab 02/23/22 ?1730 02/24/22 ?0351 02/25/22 ?0143 02/26/22 ?0500 02/27/22 ?0456  ?WBC 6.0 5.9 6.2 5.2 5.6  ?NEUTROABS 3.7 3.8 4.3 3.3 3.6  ?  HGB 12.0* 11.8* 11.1* 11.4* 11.5*  ?HCT 39.6 38.4* 36.2* 37.2* 36.9*  ?MCV 87.2 87.9 87.7 86.7 85.8  ?PLT 173 170 158 178 208  ? ? ?Basic Metabolic Panel: ?Recent Labs  ?Lab 02/23/22 ?1730 02/24/22 ?0351 02/25/22 ?0143 02/26/22 ?0500 02/27/22 ?0456  ?NA 135 134* 135 138 136  ?K 4.1 3.9 3.9 4.1 3.4*  ?CL 97* 96*  94* 95* 95*  ?CO2 30 32 33* 34* 34*  ?GLUCOSE 108* 114* 108* 118* 126*  ?BUN '21 18 14 14 12  '$ ?CREATININE 1.29* 1.21 1.15 1.20 1.30*  ?CALCIUM 8.7* 8.7* 8.6* 8.9 8.7*  ?MG 2.4 2.4 1.9 1.8 2.0  ?PHOS 2.3* 2.4* 2.0* 2.7 4.4  ? ?GFR: ?Estimated Creatinine Clearance: 94.3 mL/min (A) (by C-G formula based on SCr of 1.3 mg/dL (H)). ?Recent Labs  ?Lab 02/23/22 ?1730 02/23/22 ?2022 02/24/22 ?0351 02/25/22 ?0143 02/26/22 ?0500 02/27/22 ?0456  ?PROCALCITON 0.43  --  0.34 0.59  --   --   ?WBC 6.0  --  5.9 6.2 5.2 5.6  ?LATICACIDVEN 0.9 0.8  --   --   --   --   ? ? ?Liver Function Tests: ?Recent Labs  ?Lab 02/23/22 ?1730 02/24/22 ?0351 02/25/22 ?0143 02/26/22 ?0500 02/27/22 ?0456  ?AST 124* 96* 65* 49* 44*  ?ALT 60* 55* 50* 47* 51*  ?ALKPHOS 16 38 46 65 99  ?BILITOT 0.6 0.5 0.9 0.7 0.5  ?PROT 6.7 6.6 6.3* 6.3* 6.1*  ?ALBUMIN 3.2* 3.1* 2.9* 2.8* 2.8*  ? ?No results for input(s): LIPASE, AMYLASE in the last 168 hours. ?Recent Labs  ?Lab 02/23/22 ?1638 02/24/22 ?1000 02/25/22 ?1441  ?AMMONIA 57* 46* 25  ? ? ?ABG ?   ?Component Value Date/Time  ? PHART 7.4 02/25/2022 1345  ? PCO2ART 62 (H) 02/25/2022 1345  ? PO2ART 114 (H) 02/25/2022 1345  ? HCO3 38.4 (H) 02/25/2022 1345  ? TCO2 37 (H) 02/23/2022 1644  ? O2SAT 98 02/25/2022 1345  ?  ? ?Coagulation Profile: ?No results for input(s): INR, PROTIME in the last 168 hours. ? ?Cardiac Enzymes: ?No results for input(s): CKTOTAL, CKMB, CKMBINDEX, TROPONINI in the last 168 hours. ? ?HbA1C: ?Hemoglobin A1C  ?Date/Time Value Ref Range Status  ?10/13/2018 01:52 PM 5.8 (A) 4.0 - 5.6 % Final  ?07/03/2018 02:47 PM 5.9 (A) 4.0 - 5.6 % Final  ? ?Hgb A1c MFr Bld  ?Date/Time Value Ref Range Status  ?12/22/2019 07:05 AM 6.4 (H) 4.8 - 5.6 % Final  ?  Comment:  ?  REPEATED TO VERIFY ?(NOTE) ?Pre diabetes:          5.7%-6.4% ?Diabetes:              >6.4% ?Glycemic control for   <7.0% ?adults with diabetes ?  ?02/06/2016 10:00 AM 5.8 (H) 4.8 - 5.6 % Final  ?  Comment:  ?  (NOTE) ?        Pre-diabetes: 5.7 -  6.4 ?        Diabetes: >6.4 ?        Glycemic control for adults with diabetes: <7.0 ?  ? ? ?CBG: ?Recent Labs  ?Lab 02/21/22 ?2008  ?GLUCAP 127*  ? ?Okay to transfer to progressive care unit.  PCCM will sign.  Please call with questions ? ?Jacky Kindle MD ?[facility] ?See Amion for pager ?If no response to pager, please call [phone] until 7pm ?After 7pm, Please call E-link [phone] ? ? ? ? ? ?

## 2022-02-27 NOTE — Progress Notes (Signed)
The case Was discussed with PCCM Dr. Jacky Kindle and patient currently remains in the ICU today and his seen by the critical care team and will be transferred to the progressive care unit.  [facility] will assume consult care again in the morning as he is seen by the critical care team today with the neurosurgery team remaining primary.  PCCM will sign off in the morning.  Patient is improving from a respiratory standpoint and per primary team his Foley catheter will be removed today. ?

## 2022-02-28 ENCOUNTER — Inpatient Hospital Stay (HOSPITAL_COMMUNITY): Payer: Medicaid Other

## 2022-02-28 DIAGNOSIS — J9601 Acute respiratory failure with hypoxia: Secondary | ICD-10-CM | POA: Diagnosis not present

## 2022-02-28 DIAGNOSIS — J9602 Acute respiratory failure with hypercapnia: Secondary | ICD-10-CM | POA: Diagnosis not present

## 2022-02-28 LAB — CBC WITH DIFFERENTIAL/PLATELET
Abs Immature Granulocytes: 0.02 10*3/uL (ref 0.00–0.07)
Basophils Absolute: 0.1 10*3/uL (ref 0.0–0.1)
Basophils Relative: 1 %
Eosinophils Absolute: 0.2 10*3/uL (ref 0.0–0.5)
Eosinophils Relative: 5 %
HCT: 37.5 % — ABNORMAL LOW (ref 39.0–52.0)
Hemoglobin: 11.4 g/dL — ABNORMAL LOW (ref 13.0–17.0)
Immature Granulocytes: 0 %
Lymphocytes Relative: 18 %
Lymphs Abs: 0.9 10*3/uL (ref 0.7–4.0)
MCH: 26.6 pg (ref 26.0–34.0)
MCHC: 30.4 g/dL (ref 30.0–36.0)
MCV: 87.4 fL (ref 80.0–100.0)
Monocytes Absolute: 0.7 10*3/uL (ref 0.1–1.0)
Monocytes Relative: 14 %
Neutro Abs: 3 10*3/uL (ref 1.7–7.7)
Neutrophils Relative %: 62 %
Platelets: 204 10*3/uL (ref 150–400)
RBC: 4.29 MIL/uL (ref 4.22–5.81)
RDW: 13 % (ref 11.5–15.5)
WBC: 4.9 10*3/uL (ref 4.0–10.5)
nRBC: 0 % (ref 0.0–0.2)

## 2022-02-28 LAB — COMPREHENSIVE METABOLIC PANEL
ALT: 46 U/L — ABNORMAL HIGH (ref 0–44)
AST: 38 U/L (ref 15–41)
Albumin: 3 g/dL — ABNORMAL LOW (ref 3.5–5.0)
Alkaline Phosphatase: 63 U/L (ref 38–126)
Anion gap: 10 (ref 5–15)
BUN: 8 mg/dL (ref 8–23)
CO2: 32 mmol/L (ref 22–32)
Calcium: 8.8 mg/dL — ABNORMAL LOW (ref 8.9–10.3)
Chloride: 95 mmol/L — ABNORMAL LOW (ref 98–111)
Creatinine, Ser: 1.14 mg/dL (ref 0.61–1.24)
GFR, Estimated: >60 mL/min (ref >60)
Glucose, Bld: 112 mg/dL — ABNORMAL HIGH (ref 70–99)
Potassium: 4 mmol/L (ref 3.5–5.1)
Sodium: 137 mmol/L (ref 135–145)
Total Bilirubin: 0.1 mg/dL — ABNORMAL LOW (ref 0.3–1.2)
Total Protein: 6.2 g/dL — ABNORMAL LOW (ref 6.5–8.1)

## 2022-02-28 LAB — MAGNESIUM: Magnesium: 1.9 mg/dL (ref 1.7–2.4)

## 2022-02-28 LAB — PHOSPHORUS: Phosphorus: 3.8 mg/dL (ref 2.5–4.6)

## 2022-02-28 MED ORDER — IPRATROPIUM BROMIDE 0.02 % IN SOLN: 0.5000 mg | Freq: Four times a day (QID) | RESPIRATORY_TRACT | Status: DC | PRN

## 2022-02-28 MED ORDER — LEVALBUTEROL HCL 0.63 MG/3ML IN NEBU
0.6300 mg | INHALATION_SOLUTION | Freq: Four times a day (QID) | RESPIRATORY_TRACT | Status: DC | PRN
  Filled 2022-02-28: qty 3

## 2022-02-28 NOTE — Progress Notes (Signed)
Pt arrived to 4NP-06 from 4N ICU with 3 bags of belongings. Pt lying in bed A&O x 4 denies pain at this time. Full assessment and vital signs documented. Pt oriented to unit and educated on call bell and left within reach. Bed alarm on.  ?

## 2022-02-28 NOTE — Progress Notes (Signed)
No behavior or agitation issues noted in the past several days. Patient is currently resting in bed. Very pleasant and compliant with all medical and physical therapy treatment. Will continue to monitor this patient.  ?

## 2022-02-28 NOTE — TOC Progression Note (Signed)
Transition of Care (TOC) - Progression Note  ? ? ?Patient Details  ?Name: Kenneth Davila ?MRN: [id number] ?Date of Birth: 15-Nov-1956 ? ?Transition of Care (TOC) CM/SW Contact  ?Benard Halsted, LCSW ?Phone Number: ?02/28/2022, 1:09 PM ? ?Clinical Narrative:    ?CSW spoke with Maudie Mercury at [facility]. She stated they have concerns about notes stating patient has agitation. CSW explained those are from initial admission but patient is now Ox4. RN and MD concurred and updated notes to reflect. Maudie Mercury stated she will let CSW know. No other SNF bed offers at this time.  ? ? ?Expected Discharge Plan: [place] ?Barriers to Discharge: Ship broker, Continued Medical Work up, SNF Pending bed offer (obesity) ? ?Expected Discharge Plan and Services ?Expected Discharge Plan: [place] ?In-house Referral: Clinical Social Work ?  ?  ?  ?                ?  ?  ?  ?  ?  ?  ?  ?  ?  ?  ? ? ?Social Determinants of Health (SDOH) Interventions ?  ? ?Readmission Risk Interventions ?No flowsheet data found. ? ?

## 2022-02-28 NOTE — Progress Notes (Signed)
Neurosurgery Service ?Progress Note ? ?Subjective: No acute events overnight, continues to progress / improve, was ambulating more yesterday ? ?Objective: ?Vitals:  ? 02/28/22 0500 02/28/22 0600 02/28/22 0700 02/28/22 0729  ?BP: 131/68 (!) 142/65 117/89   ?Pulse: 92 88 88   ?Resp: '11 13 12   '$ ?Temp:      ?TempSrc:      ?SpO2: 95% (!) 89% 95% 95%  ?Weight:      ?Height:      ? ? ?Physical Exam: ?Aox3, Strength 5/5 ?SILTx4 except bilateral stocking numbness  ? ?Assessment & Plan: ?66 y.o. man s/p 2 level MIS decompression and TLIF, recovering well. ? ?-medicine, PCCM recs for acute on chronic hypercarbic respiratory failure, AKI, acute metabolic encephalopathy ?-plan to transfer out of the unit today, bed pending ?-activity as tolerated, no brace needed ?-SCDs/TEDs, SQH ? ?Kenneth Davila  ?02/28/22 ?7:31 AM ? ?

## 2022-02-28 NOTE — Progress Notes (Signed)
Physical Therapy Treatment ?Patient Details ?Name: Kenneth Davila ?MRN: [id number] ?DOB: 07/15/1956 ?Today's Date: 02/28/2022 ? ? ?History of Present Illness Pt is 66 yo male who was suffering from claudication pain with progressive foot weakness and incontinence. Presented for TLIF L4-5, L5-S1. PMH: GERD, HTN, asth,a. arthritis, anxiety and depression. ? ?  ?PT Comments  ? ? Pt was able to mobilize in the bed and perform sit to stand transfer with EOB raised with supervision assist. Subjective reporting indicates that symptoms into the legs continue to improve and that his legs are feeling stronger to him. Pt was able to walk further in a single bout with min guard assist and supplemental O2, demonstrating improved endurance consistent with subjective report. Mild drop in O2 during activity but returned to normal with rest. During treatment session patient showed deficits in strength, endurance, activity tolerance. Recommending therapy services at skilled nursing facility to address the previously stated deficits. Will continue to follow acutely to maximize functional mobility, independence and safety. ?  ?Recommendations for follow up therapy are one component of a multi-disciplinary discharge planning process, led by the attending physician.  Recommendations may be updated based on patient status, additional functional criteria and insurance authorization. ? ?Follow Up Recommendations ? Skilled nursing-short term rehab (<3 hours/day) ?  ?  ?Assistance Recommended at Discharge Frequent or constant Supervision/Assistance  ?Patient can return home with the following Assistance with cooking/housework;Assist for transportation;Help with stairs or ramp for entrance;Direct supervision/assist for medications management;A lot of help with walking and/or transfers;A lot of help with bathing/dressing/bathroom ?  ?Equipment Recommendations ? None recommended by PT  ?  ?Recommendations for Other Services   ? ? ?  ?Precautions /  Restrictions Precautions ?Precautions: Back ?Restrictions ?Weight Bearing Restrictions: No  ?  ? ?Mobility ? Bed Mobility ?Overal bed mobility: Needs Assistance ?Bed Mobility: Sidelying to Sit, Rolling ?Rolling: Modified independent (Device/Increase time) ?Sidelying to sit: Modified independent (Device/Increase time) ?  ?  ?  ?General bed mobility comments: Pt had heavy use of the railing and elevated head of bed for bed mobility. ?  ? ?Transfers ?  ?Equipment used: Rolling walker (2 wheels) ?Transfers: Sit to/from Stand, Bed to chair/wheelchair/BSC ?Sit to Stand: From elevated surface, Supervision ?  ?  ?  ?  ?  ?General transfer comment: Pt required supervision assist to stand up from elevated EOB. He used his UE on walker to help him stand while therapist stabilized walker. ?  ? ?Ambulation/Gait ?Ambulation/Gait assistance: Min guard ?Gait Distance (Feet): 150 Feet ?Assistive device: Rolling walker (2 wheels) ?Gait Pattern/deviations: Step-through pattern, Decreased stride length ?Gait velocity: reduced ?  ?  ?General Gait Details: Pt requried min guard for balance during ambuation and help with line managment and supplemental O2 tank. Pt reported fatigue in his legs upon completion of the walk. ? ? ?Stairs ?  ?  ?  ?  ?  ? ? ?Wheelchair Mobility ?  ? ?Modified Rankin (Stroke Patients Only) ?  ? ? ?  ?Balance Overall balance assessment: Needs assistance ?Sitting-balance support: Feet supported, Bilateral upper extremity supported ?Sitting balance-Leahy Scale: Good ?Sitting balance - Comments: Reliant on RW for sitting balance to catch his breath. ?  ?Standing balance support: Bilateral upper extremity supported, During functional activity, Reliant on assistive device for balance ?Standing balance-Leahy Scale: Fair ?Standing balance comment: Dependent on walker for balance ?  ?  ?  ?  ?  ?  ?  ?  ?  ?  ?  ?  ? ?  ?  Cognition Arousal/Alertness: Awake/alert ?Behavior During Therapy: [facility] for tasks  assessed/performed ?Overall Cognitive Status: Within Functional Limits for tasks assessed ?  ?  ?  ?  ?  ?  ?  ?  ?  ?  ?  ?  ?  ?  ?  ?  ?  ?  ?  ? ?  ?Exercises   ? ?  ?General Comments General comments (skin integrity, edema, etc.): Pt was on 6L of supplemental O2 during walk in the hallway. ?  ?  ? ?Pertinent Vitals/Pain Pain Assessment ?Pain Assessment: Faces ?Faces Pain Scale: Hurts a little bit ?Pain Location: low back ?Pain Descriptors / Indicators: Operative site guarding ?Pain Intervention(s): Monitored during session  ? ? ?Home Living   ?  ?  ?  ?  ?  ?  ?  ?  ?  ?   ?  ?Prior Function    ?  ?  ?   ? ?PT Goals (current goals can now be found in the care plan section)   ? ?  ?Frequency ? ? ? Min 5X/week ? ? ? ?  ?PT Plan Current plan remains appropriate  ? ? ?Co-evaluation   ?  ?  ?  ?  ? ?  ?AM-PAC PT "6 Clicks" Mobility   ?Outcome Measure ? Help needed turning from your back to your side while in a flat bed without using bedrails?: A Little ?Help needed moving from lying on your back to sitting on the side of a flat bed without using bedrails?: A Little ?Help needed moving to and from a bed to a chair (including a wheelchair)?: A Little ?Help needed standing up from a chair using your arms (e.g., wheelchair or bedside chair)?: A Little ?Help needed to walk in hospital room?: A Little ?Help needed climbing 3-5 steps with a railing? : Total ?6 Click Score: 16 ? ?  ?End of Session Equipment Utilized During Treatment: Oxygen;Gait belt ?Activity Tolerance: Patient tolerated treatment well ?Patient left: with call bell/phone within reach;Other (comment) (Pt was left in the rest room and nursing staff was notified that pt would call when he was done.) ?Nurse Communication: Other (comment) (Need for help in the rest room) ?PT Visit Diagnosis: Unsteadiness on feet (R26.81);Muscle weakness (generalized) (M62.81);Difficulty in walking, not elsewhere classified (R26.2);Pain ?  ? ? ?Time: 1423-9532 ?PT Time  Calculation (min) (ACUTE ONLY): 26 min ? ?Charges:  $Gait Training: 8-22 mins ?$Therapeutic Activity: 8-22 mins          ?          ? ?Quenton Fetter, SPT ? ? ? ?Quenton Fetter ?02/28/2022, 6:12 PM ? ?

## 2022-02-28 NOTE — Progress Notes (Signed)
Occupational Therapy Treatment ?Patient Details ?Name: Kenneth Davila ?MRN: [id number] ?DOB: Jun 28, 1956 ?Today's Date: 02/28/2022 ? ? ?History of present illness Pt is 66 yo male who was suffering from claudication pain with progressive foot weakness and incontinence. Presented for TLIF L4-5, L5-S1. PMH: GERD, HTN, asth,a. arthritis, anxiety and depression. ?  ?OT comments ? Patient with good progress toward all patient focused goals.  Supine to sit with cues and Min A, sit to stand with Min Guard due to patient needing to create momentum, Min Guard for transfers and general mobility, Mod A for lower body ADL from sitting position.  OT to continue efforts and begin hip kit training, with SNF being recommended for post acute rehab prior to home.  The patient does not have any assist at home.    ? ?Recommendations for follow up therapy are one component of a multi-disciplinary discharge planning process, led by the attending physician.  Recommendations may be updated based on patient status, additional functional criteria and insurance authorization. ?   ?Follow Up Recommendations ? Skilled nursing-short term rehab (<3 hours/day)  ?  ?Assistance Recommended at Discharge Intermittent Supervision/Assistance  ?Patient can return home with the following ? A little help with walking and/or transfers;A lot of help with bathing/dressing/bathroom;Assist for transportation ?  ?Equipment Recommendations ?    ?  ?Recommendations for Other Services   ? ?  ?Precautions / Restrictions Precautions ?Precautions: Back ?Restrictions ?Weight Bearing Restrictions: No  ? ? ?  ? ?Mobility Bed Mobility ?  ?Bed Mobility: Sidelying to Sit ?  ?Sidelying to sit: Min assist ?  ?  ?  ?  ?Patient Response: Cooperative ? ?Transfers ?Overall transfer level: Needs assistance ?Equipment used: Rolling walker (2 wheels) ?Transfers: Sit to/from Stand, Bed to chair/wheelchair/BSC ?Sit to Stand: Min guard, From elevated surface ?  ?  ?Step pivot transfers:  Min assist ?  ?  ?  ?  ?  ?Balance Overall balance assessment: Needs assistance ?Sitting-balance support: Feet supported ?Sitting balance-Leahy Scale: Good ?  ?  ?Standing balance support: Bilateral upper extremity supported ?Standing balance-Leahy Scale: Fair ?  ?  ?  ?  ?  ?  ?  ?  ?  ?  ?  ?  ?   ? ?ADL either performed or assessed with clinical judgement  ? ?ADL   ?  ?  ?Grooming: Wash/dry face;Min guard;Standing ?  ?  ?  ?  ?  ?Upper Body Dressing : Minimal assistance;Sitting ?  ?Lower Body Dressing: Moderate assistance;Sitting/lateral leans ?  ?  ?  ?  ?  ?  ?  ?  ?  ?  ? ?Extremity/Trunk Assessment Upper Extremity Assessment ?Upper Extremity Assessment: Overall WFL for tasks assessed ?  ?Lower Extremity Assessment ?Lower Extremity Assessment: Defer to PT evaluation ?  ?Cervical / Trunk Assessment ?Cervical / Trunk Assessment: Back Surgery ?  ? ?   ?  ?  ?   ?  ?   ?  ? ?Cognition Arousal/Alertness: Awake/alert ?Behavior During Therapy: [facility] for tasks assessed/performed ?Overall Cognitive Status: Within Functional Limits for tasks assessed ?  ?  ?  ?  ?  ?  ?  ?  ?  ?  ?  ?  ?  ?  ?  ?  ?  ?  ?  ?   ?   ? ?  ?   ? ? ?  ?    ? ? ?Pertinent Vitals/ Pain       Pain  Assessment ?Pain Assessment: 0-10 ?Pain Score: 2  ?Pain Location: low back ?Pain Descriptors / Indicators: Tender ?Pain Intervention(s): Monitored during session ? ?   ?  ?  ?  ?  ?  ?  ?  ?  ?  ?  ?  ?  ?  ?  ?  ?  ?  ?  ? ?  ?    ?  ?  ?  ?   ? ?Frequency ? Min 2X/week  ? ? ? ? ?  ?Progress Toward Goals ? ?OT Goals(current goals can now be found in the care plan section) ? Progress towards OT goals: Progressing toward goals ? ?Acute Rehab OT Goals ?Patient Stated Goal: Get back to my dog ?OT Goal Formulation: With patient ?Time For Goal Achievement: 03/06/22 ?Potential to Achieve Goals: Good  ?Plan Discharge plan remains appropriate;Frequency remains appropriate   ? ?Co-evaluation ? ? ?   ?  ?  ?  ?  ? ?  ?AM-PAC OT "6 Clicks" Daily Activity      ?Outcome Measure ? ? Help from another person eating meals?: None ?Help from another person taking care of personal grooming?: None ?Help from another person toileting, which includes using toliet, bedpan, or urinal?: A Little ?Help from another person bathing (including washing, rinsing, drying)?: A Lot ?Help from another person to put on and taking off regular upper body clothing?: A Little ?Help from another person to put on and taking off regular lower body clothing?: A Lot ?6 Click Score: 18 ? ?  ?End of Session Equipment Utilized During Treatment: Rolling walker (2 wheels);Oxygen ? ?OT Visit Diagnosis: Unsteadiness on feet (R26.81);Other abnormalities of gait and mobility (R26.89);Muscle weakness (generalized) (M62.81) ?  ?Activity Tolerance   ?  ?Patient Left in chair;with call bell/phone within reach ?  ?Nurse Communication Mobility status ?  ? ?   ? ?Time: 7322-0254 ?OT Time Calculation (min): 22 min ? ?Charges: OT General Charges ?$OT Visit: 1 Visit ?OT Treatments ?$Self Care/Home Management : 8-22 mins ? ?02/28/2022 ? ?RP, OTR/L ? ?Acute Rehabilitation Services ? ?Office:  [phone] ? ? ?Dante Roudebush D Remmy Riffe ?02/28/2022, 2:12 PM ? ? ?

## 2022-02-28 NOTE — Progress Notes (Addendum)
?PROGRESS NOTE ? ?Kenneth Davila VOH:[id number] DOB: 11-Dec-1956 DOA: 02/19/2022 ?PCP: Nolene Ebbs, MD ? ? LOS: 9 days  ? ?Brief Narrative / Interim history: ?66 yo male with lumbar stenosis and claudication admitted for lumbar laminectomy done on 02/19/22.  He has hx of developed lethargy and hypoxia on 02/21/22.  ABG showed acute on chronic hypercapnia.  He was started on Bipap.  He was found to have elevated ammonia level of 57 also, and started on lactulose.  He developed agitation and confusion, and had difficulty keeping medical equipment on.  ? ?Events ?3/06 lumbar laminectomy, foley placed by urology due to retention ?3/08 start on Bipap ?3/10 hospitalist consulted ?3/12 PCCM consulted due to agitation, on Precedex ?3/15 back to hospitalist service ? ?Subjective / 24h Interval events: ?Doing well this morning, denies shortness of breath. Tells me that he is supposed to be on oxygen at home and CPAP but has not been using it as prescribed.  ? ?Assesement and Plan: ?Principal Problem: ?  Acute respiratory failure with hypoxia and hypercarbia (HCC) ?Active Problems: ?  GERD (gastroesophageal reflux disease) ?  Chronic pain syndrome ?  Tobacco dependence ?  Diabetes mellitus type 2, diet-controlled ([place]) ?  Morbid obesity ([place]) ?  Lumbar stenosis with neurogenic claudication ?  Hyperammonemia ([place]) ?  AKI (acute kidney injury) ([place]) ?  Normocytic anemia ?  Abnormal LFTs ?  Obesity hypoventilation syndrome ([place]) ?  Agitation ?  Aortic aneurysm ([place]) ?  Hilar lymphadenopathy ? ? ?Assessment and Plan: ?Principal problem ?Acute on chronic hypoxic, hypercapnic respiratory failure -likely from OSA/OHS exacerbated by opiate pain medication use after surgery. His sleep study from 11/26/20 showed severe sleep apnea with an AHI of 121 and SpO2 low of 51%. Continue BiPAP while he is at sleep and switch to nasal cannula oxygen during daytime. Needs to continue to use CPAP on discharge. Apparently his home CPAP has been  recalled and he is in the process if changing it. He has not been wearing his O2 during daytime as he does not appear to have portable O2. 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 Titrate FiO2 with goal O2 sat above 92%. Limit opiate as much as possible ? ?He appears stable for discharge to SNF when a bed becomes available  ? ?Active problems ?Acute metabolic encephalopathy 2nd to hypoxia / hypercapnia, agitation ?-resolved, no longer confused or agitated, he is back to baseline ? ?S/p lumbar laminectomy -Post op care per neurosurgery ?  ?Acute urine retention -Foley placed by urology on 3/06. Completed Keflex for 5 days per urology recommendations. Foley removed 3/14, now urinating on his own ?  ?Elevated ammonia level -Continue lactulose ?  ?Hilar lymphadenopathy -CTA showed "Mildly enlarged mediastinal and hilar nodes but they are less enlarged than previously suggesting the previous nodal enlargement was probably due to reactive hyperplasia".  Follow-up chest CT in 12 ?months may be considered to ensure stability. ? ?Aortic aneurysm (HCC) -CT Scan showed "Evaluation of the aorta demonstrates a borderline aneurysmal aortic root measuring 3.9 cm, ectatic ascending segment 3.7 cm, and the remainder within normal caliber limits. No aortic dissection or penetrating ulcer." Outpatient Follow up with Vascular and Repeat Imaging  ? ?Abnormal LFTs - Unclear etiology but could be reactive and is now trending down ? ?Normocytic anemia -stable ? ?AKI (acute kidney injury) ([place]) -resolved ? ?Obesity, class III -Complicates overall prognosis and care ? ? ?Scheduled Meds: ?  Chlorhexidine Gluconate Cloth  6 each Topical Daily  ? docusate sodium  100 mg Oral BID  ? heparin injection (subcutaneous)  5,000 Units Subcutaneous Q8H  ? ipratropium  0.5 mg Nebulization BID  ? lactulose  20 g Oral BID  ? levalbuterol  0.63 mg Nebulization BID  ? LORazepam  2 mg Intravenous Once  ?  mouth rinse  15 mL Mouth Rinse BID  ? pregabalin  150 mg Oral Daily  ? QUEtiapine  50 mg Oral QHS  ? sodium chloride flush  3 mL Intravenous Q12H  ? ?Continuous Infusions: ? sodium chloride    ? ?PRN Meds:.acetaminophen **OR** acetaminophen, cyclobenzaprine, haloperidol lactate, HYDROmorphone (DILAUDID) injection, ipratropium-albuterol, LORazepam, menthol-cetylpyridinium **OR** phenol, naLOXone (NARCAN)  injection, ondansetron **OR** ondansetron (ZOFRAN) IV, oxyCODONE, oxyCODONE, polyethylene glycol, sodium chloride flush ? ?Diet Orders (From admission, onward)  ? ?  Start     Ordered  ? 02/20/22 0757  Diet regular Room service appropriate? Yes with Assist; Fluid consistency: Thin  Diet effective now       ?Question Answer Comment  ?Room service appropriate? Yes with Assist   ?Fluid consistency: Thin   ?  ? 02/20/22 0756  ? ?  ?  ? ?  ? ? ?DVT prophylaxis: heparin injection 5,000 Units Start: 02/21/22 0915 ?SCD's Start: 02/19/22 1834 ? ? ?Lab Results  ?Component Value Date  ? PLT 204 02/28/2022  ? ? ?  Code Status: Full Code ? ?Family Communication: no family at bedside  ? ?Status is: Inpatient ? ?Remains inpatient appropriate because: waiting on SNF bed ? ?Level of care: Progressive ? ? ?Objective: ?Vitals:  ? 02/28/22 0400 02/28/22 0500 02/28/22 0600 02/28/22 0700  ?BP: (!) 144/69 131/68 (!) 142/65 117/89  ?Pulse: 98 92 88 88  ?Resp: '11 11 13 12  '$ ?Temp: (!) 97.3 ?F (36.3 ?C)     ?TempSrc: Axillary     ?SpO2: 96% 95% (!) 89% 95%  ?Weight:      ?Height:      ? ? ?Intake/Output Summary (Last 24 hours) at 02/28/2022 0729 ?Last data filed at 02/28/2022 0600 ?Gross per 24 hour  ?Intake 980 ml  ?Output --  ?Net 980 ml  ? ?Wt Readings from Last 3 Encounters:  ?02/27/22 (!) 163.2 kg  ?02/07/22 (!) 170.3 kg  ?11/26/20 (!) 164.2 kg  ? ? ?Examination: ? ?Constitutional: NAD ?Eyes: no scleral icterus ?ENMT: Mucous membranes are moist.  ?Neck: normal, supple ?Respiratory: clear to auscultation bilaterally, no wheezing, no  crackles.  ?Cardiovascular: Regular rate and rhythm, no murmurs / rubs / gallops. No LE edema.  ?Abdomen: non distended, no tenderness. Bowel sounds positive.  ?Musculoskeletal: no clubbing / cyanosis.  ?Skin: no rashes ?Neurologic: non focal  ? ? ?Data Reviewed: I have independently reviewed following labs and imaging studies ? ?CBC ?Recent Labs  ?Lab 02/24/22 ?0351 02/25/22 ?0143 02/26/22 ?0500 02/27/22 ?0456 02/28/22 ?0314  ?WBC 5.9 6.2 5.2 5.6 4.9  ?HGB 11.8* 11.1* 11.4* 11.5* 11.4*  ?HCT 38.4* 36.2* 37.2* 36.9* 37.5*  ?PLT 170 158 178 208 204  ?MCV 87.9 87.7 86.7 85.8 87.4  ?MCH 27.0 26.9 26.6 26.7 26.6  ?MCHC 30.7 30.7 30.6 31.2 30.4  ?RDW 13.0 12.9 13.0 12.9 13.0  ?LYMPHSABS 1.1 0.9 0.9 1.0 0.9  ?MONOABS 0.8 0.8 0.8 0.7 0.7  ?EOSABS 0.2 0.2 0.2 0.2 0.2  ?BASOSABS 0.0 0.0 0.0 0.0 0.1  ? ? ?Recent Labs  ?Lab 02/23/22 ?1638 02/23/22 ?1644 02/23/22 ?1730 02/23/22 ?1737 02/23/22 ?2022 02/24/22 ?0351 02/24/22 ?[place]  02/25/22 ?0143 02/25/22 ?1029 02/25/22 ?1441 02/26/22 ?0500 02/27/22 ?0456 02/28/22 ?0314  ?NA  --    < > 135  --   --  134*  --  135  --   --  138 136 137  ?K  --    < > 4.1  --   --  3.9  --  3.9  --   --  4.1 3.4* 4.0  ?CL  --   --  97*  --   --  96*  --  94*  --   --  95* 95* 95*  ?CO2  --   --  30  --   --  32  --  33*  --   --  34* 34* 32  ?GLUCOSE  --   --  108*  --   --  114*  --  108*  --   --  118* 126* 112*  ?BUN  --   --  21  --   --  18  --  14  --   --  '14 12 8  '$ ?CREATININE  --   --  1.29*  --   --  1.21  --  1.15  --   --  1.20 1.30* 1.14  ?CALCIUM  --   --  8.7*  --   --  8.7*  --  8.6*  --   --  8.9 8.7* 8.8*  ?AST  --    < > 124*  --   --  96*  --  65*  --   --  49* 44* 38  ?ALT  --    < > 60*  --   --  55*  --  50*  --   --  47* 51* 46*  ?ALKPHOS  --    < > 60  --   --  63  --  58  --   --  59 62 63  ?BILITOT  --    < > 0.6  --   --  0.5  --  0.9  --   --  0.7 0.5 0.1*  ?ALBUMIN  --   --  3.2*  --   --  3.1*  --  2.9*  --   --  2.8* 2.8* 3.0*  ?MG  --    < > 2.4  --   --  2.4  --  1.9   --   --  1.8 2.0 1.9  ?DDIMER  --   --   --   --   --   --   --   --  7.48*  --   --   --   --   ?PROCALCITON  --   --  0.43  --   --  0.34  --  0.59  --   --   --   --   --   ?LATICACIDVEN  --   --  0.9  --  0.8  --

## 2022-02-28 NOTE — NC FL2 (Signed)
?[place] MEDICAID FL2 LEVEL OF CARE SCREENING TOOL  ?  ? ?IDENTIFICATION  ?Patient Name: ?Kenneth Davila Birthdate: 11-23-1956 Sex: male Admission Date (Current Location): ?02/19/2022  ?[place] and [place] Number: ? [place] ?  Facility and Address:  ?[facility] [place] [facility], [place] [address] [place], [place] [address] ?     Provider Number: ?[id number]  ?Attending Physician Name and Address:  ?Judith Part, MD ? Relative Name and Phone Number:  ?Renzo Vincelette (son) [phone] ?   ?Current Level of Care: ?Hospital Recommended Level of Care: ?[place] Prior Approval Number: ?  ? ?Date Approved/Denied: ?  PASRR Number: ?[id number] A ? ?Discharge Plan: ?SNF ?  ? ?Current Diagnoses: ?Patient Active Problem List  ? Diagnosis Date Noted  ? Aortic aneurysm ([place]) 02/26/2022  ? Hilar lymphadenopathy 02/26/2022  ? Agitation 02/25/2022  ? Abnormal LFTs 02/24/2022  ? Obesity hypoventilation syndrome ([place]) 02/24/2022  ? Acute respiratory failure with hypoxia and hypercarbia ([place]) 02/23/2022  ? Hyperammonemia ([place]) 02/23/2022  ? AKI (acute kidney injury) ([place]) 02/23/2022  ? Normocytic anemia 02/23/2022  ? Lumbar stenosis with neurogenic claudication 02/19/2022  ? Snoring 11/26/2020  ? DVT (deep venous thrombosis) ([place]) 12/25/2019  ? Morbid obesity ([place]) 12/22/2019  ? Acute respiratory failure due to COVID-19 [facility]) 12/21/2019  ? Diabetes mellitus type 2, diet-controlled ([place]) 12/21/2019  ? Intractable chronic post-traumatic headache 01/08/2019  ? Abnormality of gait 12/11/2018  ? Metallic taste 26/71/2458  ? Status post hernia repair 02/21/2016  ? Prediabetes 02/21/2016  ? Incarcerated incisional hernia s/p lap repair w mesh 02/09/2016 02/09/2016  ? Bilateral inguinal hernia (BIH) s/p lap repair with mesh 02/09/2016 02/09/2016  ? Tobacco dependence 11/21/2015  ? Neck pain, chronic 09/19/2015  ? History of colonic polyps 09/19/2015  ? Colon polyps   ? Family history of colon cancer   ? Chronic  headache 04/05/2014  ? Right knee pain 09/10/2013  ? GERD (gastroesophageal reflux disease) 09/10/2013  ? Chronic pain syndrome 09/10/2013  ? History of depression 09/10/2013  ? Ventral hernia 09/10/2013  ? ? ?Orientation RESPIRATION BLADDER Height & Weight   ?  ?Self, Time, Situation, Place ? O2 (Needs Bipap ordered at SNF) Continent Weight: (!) 359 lb 12.7 oz (163.2 kg) ?Height:  '6\' 2"'$  (188 cm)  ?BEHAVIORAL SYMPTOMS/MOOD NEUROLOGICAL BOWEL NUTRITION STATUS  ?    Continent Diet (See DC summary)  ?AMBULATORY STATUS COMMUNICATION OF NEEDS Skin   ?Limited Assist Verbally Surgical wounds (Back incision) ?  ?  ?  ?    ?     ?     ? ? ?Personal Care Assistance Level of Assistance  ?Bathing, Feeding, Dressing Bathing Assistance: Maximum assistance ?Feeding assistance: Independent ?Dressing Assistance: Limited assistance ?   ? ?Functional Limitations Info  ?Sight, Hearing, Speech Sight Info: Adequate ?Hearing Info: Adequate ?Speech Info: Adequate  ? ? ?SPECIAL CARE FACTORS FREQUENCY  ?PT (By licensed PT), OT (By licensed OT)   ?  ?PT Frequency: 5x week ?OT Frequency: 5x week ?  ?  ?  ?   ? ? ?Contractures Contractures Info: Not present  ? ? ?Additional Factors Info  ?Code Status, Allergies Code Status Info: Full ?Allergies Info: Amlodipine ?  ?  ?  ?   ? ?Current Medications (02/28/2022):  This is the current hospital active medication list ?Current Facility-Administered Medications  ?Medication Dose Route Frequency Provider Last Rate Last Admin  ? 0.9 %  sodium chloride infusion  250 mL Intravenous Continuous Ostergard, Joyice Faster,  MD      ? acetaminophen (TYLENOL) tablet 650 mg  650 mg Oral Q4H PRN Judith Part, MD   650 mg at 02/23/22 1228  ? Or  ? acetaminophen (TYLENOL) suppository 650 mg  650 mg Rectal Q4H PRN Judith Part, MD      ? Chlorhexidine Gluconate Cloth 2 % PADS 6 each  6 each Topical Daily Judith Part, MD   6 each at 02/28/22 0913  ? cyclobenzaprine (FLEXERIL) tablet 10 mg  10 mg Oral  TID PRN Judith Part, MD   10 mg at 02/24/22 2101  ? docusate sodium (COLACE) capsule 100 mg  100 mg Oral BID Judith Part, MD   100 mg at 02/28/22 0912  ? haloperidol lactate (HALDOL) injection 2 mg  2 mg Intravenous Q6H PRN Raiford Noble Latif, DO      ? heparin injection 5,000 Units  5,000 Units Subcutaneous Q8H Judith Part, MD   5,000 Units at 02/28/22 0604  ? HYDROmorphone (DILAUDID) injection 1 mg  1 mg Intravenous Q3H PRN Judith Part, MD   1 mg at 02/24/22 2102  ? ipratropium (ATROVENT) nebulizer solution 0.5 mg  0.5 mg Nebulization Q6H PRN Judith Part, MD      ? lactulose (CHRONULAC) 10 GM/15ML solution 20 g  20 g Oral BID Raiford Noble [place], DO   20 g at 02/28/22 9628  ? levalbuterol (XOPENEX) nebulizer solution 0.63 mg  0.63 mg Nebulization Q6H PRN Judith Part, MD      ? LORazepam (ATIVAN) injection 1 mg  1 mg Intravenous Q6H PRN Raiford Noble [place], DO      ? LORazepam (ATIVAN) injection 2 mg  2 mg Intravenous Once Raiford Noble [place], DO      ? MEDLINE mouth rinse  15 mL Mouth Rinse BID Chesley Mires, MD   15 mL at 02/28/22 0913  ? menthol-cetylpyridinium (CEPACOL) lozenge 3 mg  1 lozenge Oral PRN Judith Part, MD      ? Or  ? phenol (CHLORASEPTIC) mouth spray 1 spray  1 spray Mouth/Throat PRN Judith Part, MD      ? naloxone [facility]) injection 0.4 mg  0.4 mg Intravenous PRN Sheikh, Omair Latif, DO      ? ondansetron [facility]) tablet 4 mg  4 mg Oral Q6H PRN Judith Part, MD      ? Or  ? ondansetron (ZOFRAN) injection 4 mg  4 mg Intravenous Q6H PRN Judith Part, MD      ? oxyCODONE (Oxy IR/ROXICODONE) immediate release tablet 10 mg  10 mg Oral Q4H PRN Judith Part, MD   10 mg at 02/28/22 0912  ? oxyCODONE (Oxy IR/ROXICODONE) immediate release tablet 5 mg  5 mg Oral Q4H PRN Judith Part, MD      ? polyethylene glycol (MIRALAX / GLYCOLAX) packet 17 g  17 g Oral Daily PRN Judith Part, MD      ? pregabalin (LYRICA)  capsule 150 mg  150 mg Oral Daily Judith Part, MD   150 mg at 02/28/22 0912  ? QUEtiapine (SEROQUEL) tablet 50 mg  50 mg Oral QHS Jacky Kindle, MD   50 mg at 02/27/22 2236  ? sodium chloride flush (NS) 0.9 % injection 3 mL  3 mL Intravenous Q12H Judith Part, MD   3 mL at 02/28/22 0913  ? sodium chloride flush (NS) 0.9 % injection 3 mL  3 mL Intravenous PRN  Judith Part, MD      ? ? ? ?Discharge Medications: ?Please see discharge summary for a list of discharge medications. ? ?Relevant Imaging Results: ? ?Relevant Lab Results: ? ? ?Additional Information ?SS# [phone]. Pfizer vaccine 02/23/20, 04/04/20 ? ?Lissa Morales Deshanna Kama, LCSW ? ? ? ? ?

## 2022-03-01 DIAGNOSIS — J9602 Acute respiratory failure with hypercapnia: Secondary | ICD-10-CM | POA: Diagnosis not present

## 2022-03-01 DIAGNOSIS — J9601 Acute respiratory failure with hypoxia: Secondary | ICD-10-CM | POA: Diagnosis not present

## 2022-03-01 NOTE — TOC Progression Note (Signed)
Transition of Care (TOC) - Progression Note  ? ? ?Patient Details  ?Name: Kenneth Davila ?MRN: [id number] ?Date of Birth: Nov 30, 1956 ? ?Transition of Care (TOC) CM/SW Contact  ?Vinie Sill, LCSW ?Phone Number: ?03/01/2022, 2:59 PM ? ?Clinical Narrative:    ? ?CSW met with patient. CSW introduced self and explained role. CSW informed patient of no bed offers at this time but [facility] was reviewing for potential placement. Patient expressed his preference is to return home. He states " I have been away from home already to long, and people will begin to notice and possible break into my home". Per patient he feels comfortable & safe going home. He called his son, Joyce Gross, and placed on speaker phone- patient shared he wants to return home- his son confirmed that he would be check in and assist the patient as needed. Patient is agreeable to [facility] PT/OT. CSW explained limited HH options with payor source- he states he has transportation and a friend that can drive him to outpatient therapy if unable to secure [facility]. Patient requested TOC follow up with NIV that is needed at home.  ? ?RNCM updated  ?TOC will continue to follow and assist with discharge planning.  ? ?Thurmond Butts, MSW, LCSW ?Clinical Social Worker ? ? ? ?Expected Discharge Plan: [place] ?Barriers to Discharge: Ship broker, Continued Medical Work up, SNF Pending bed offer (obesity) ? ?Expected Discharge Plan and Services ?Expected Discharge Plan: [place] ?In-house Referral: Clinical Social Work ?  ?  ?  ?                ?  ?  ?  ?  ?  ?  ?  ?  ?  ?  ? ? ?Social Determinants of Health (SDOH) Interventions ?  ? ?Readmission Risk Interventions ?No flowsheet data found. ? ?

## 2022-03-01 NOTE — Progress Notes (Signed)
?PROGRESS NOTE ? ?Kenneth Davila GLO:[id number] DOB: January 11, 1956 DOA: 02/19/2022 ?PCP: Nolene Ebbs, MD ? ? LOS: 10 days  ? ?Brief Narrative / Interim history: ?66 yo male with lumbar stenosis and claudication admitted for lumbar laminectomy done on 02/19/22.  He has hx of developed lethargy and hypoxia on 02/21/22.  ABG showed acute on chronic hypercapnia.  He was started on Bipap.  He was found to have elevated ammonia level of 57 also, and started on lactulose.  He developed agitation and confusion, and had difficulty keeping medical equipment on.  ? ?Events ?3/06 lumbar laminectomy, foley placed by urology due to retention ?3/08 start on Bipap ?3/10 hospitalist consulted ?3/12 PCCM consulted due to agitation, on Precedex ?3/15 back to hospitalist service ? ?Subjective / 24h Interval events: ?No complaints, denies any shortness of breath. ? ?Assesement and Plan: ?Principal Problem: ?  Acute respiratory failure with hypoxia and hypercarbia (HCC) ?Active Problems: ?  GERD (gastroesophageal reflux disease) ?  Chronic pain syndrome ?  Tobacco dependence ?  Diabetes mellitus type 2, diet-controlled ([place]) ?  Morbid obesity ([place]) ?  Lumbar stenosis with neurogenic claudication ?  Hyperammonemia ([place]) ?  AKI (acute kidney injury) ([place]) ?  Normocytic anemia ?  Abnormal LFTs ?  Obesity hypoventilation syndrome ([place]) ?  Agitation ?  Aortic aneurysm ([place]) ?  Hilar lymphadenopathy ? ? ?Assessment and Plan: ?Principal problem ?Acute on chronic hypoxic, hypercapnic respiratory failure -likely from OSA/OHS exacerbated by opiate pain medication use after surgery. His sleep study from 11/26/20 showed severe sleep apnea with an AHI of 121 and SpO2 low of 51%. Continue BiPAP while he is at sleep and switch to nasal cannula oxygen during daytime. Needs to continue to use CPAP on discharge. Apparently his home CPAP has been recalled and he is in the process if changing it. He has not been wearing his O2 during daytime as he does not  appear to have portable O2. 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 Titrate FiO2 with goal O2 sat above 92%. Limit opiate as much as possible ? ?He appears stable for discharge to SNF when a bed becomes available  ? ?Active problems ?Acute metabolic encephalopathy 2nd to hypoxia / hypercapnia, agitation ?-resolved, no longer confused or agitated, he is back to baseline ? ?S/p lumbar laminectomy -Post op care per neurosurgery ?  ?Acute urine retention -Foley placed by urology on 3/06. Completed Keflex for 5 days per urology recommendations. Foley removed 3/14, now urinating on his own ?  ?Elevated ammonia level -Continue lactulose, would not need on discharge ?  ?Hilar lymphadenopathy -CTA showed "Mildly enlarged mediastinal and hilar nodes but they are less enlarged than previously suggesting the previous nodal enlargement was probably due to reactive hyperplasia".  Follow-up chest CT in 12 ?months may be considered to ensure stability. ? ?Aortic aneurysm (HCC) -CT Scan showed "Evaluation of the aorta demonstrates a borderline aneurysmal aortic root measuring 3.9 cm, ectatic ascending segment 3.7 cm, and the remainder within normal caliber limits. No aortic dissection or penetrating ulcer." Outpatient Follow up with Vascular and Repeat Imaging  ? ?Abnormal LFTs - Unclear etiology but could be reactive and is now trending down ? ?Normocytic anemia -stable ? ?AKI (acute kidney injury) ([place]) -resolved ? ?Obesity, class III -Complicates overall prognosis and care ? ? ?Scheduled Meds: ? Chlorhexidine Gluconate Cloth  6 each Topical Daily  ? docusate sodium  100 mg Oral BID  ?  heparin injection (subcutaneous)  5,000 Units Subcutaneous Q8H  ? lactulose  20 g Oral BID  ? LORazepam  2 mg Intravenous Once  ? mouth rinse  15 mL Mouth Rinse BID  ? pregabalin  150 mg Oral Daily  ? QUEtiapine  50 mg Oral QHS  ? sodium chloride flush  3 mL Intravenous Q12H  ? ?Continuous  Infusions: ? sodium chloride    ? ?PRN Meds:.acetaminophen **OR** acetaminophen, cyclobenzaprine, haloperidol lactate, HYDROmorphone (DILAUDID) injection, ipratropium, levalbuterol, LORazepam, menthol-cetylpyridinium **OR** phenol, naLOXone (NARCAN)  injection, ondansetron **OR** ondansetron (ZOFRAN) IV, oxyCODONE, oxyCODONE, polyethylene glycol, sodium chloride flush ? ?Diet Orders (From admission, onward)  ? ?  Start     Ordered  ? 02/20/22 0757  Diet regular Room service appropriate? Yes with Assist; Fluid consistency: Thin  Diet effective now       ?Question Answer Comment  ?Room service appropriate? Yes with Assist   ?Fluid consistency: Thin   ?  ? 02/20/22 0756  ? ?  ?  ? ?  ? ? ?DVT prophylaxis: heparin injection 5,000 Units Start: 02/21/22 0915 ?SCD's Start: 02/19/22 1834 ? ? ?Lab Results  ?Component Value Date  ? PLT 204 02/28/2022  ? ? ?  Code Status: Full Code ? ?Family Communication: no family at bedside  ? ?Status is: Inpatient ? ?Remains inpatient appropriate because: waiting on SNF bed ? ?Level of care: Progressive ? ? ?Objective: ?Vitals:  ? 02/28/22 2335 03/01/22 0320 03/01/22 0500 03/01/22 0737  ?BP: (!) 144/72 (!) 141/81  124/80  ?Pulse: 98 83 87 85  ?Resp: '15 15 14 14  '$ ?Temp: 98.8 ?F (37.1 ?C) 97.8 ?F (36.6 ?C)  98.1 ?F (36.7 ?C)  ?TempSrc: Oral Axillary  Oral  ?SpO2: 95% 95% 97% 92%  ?Weight:      ?Height:      ? ? ?Intake/Output Summary (Last 24 hours) at 03/01/2022 0938 ?Last data filed at 03/01/2022 0330 ?Gross per 24 hour  ?Intake 450 ml  ?Output 975 ml  ?Net -525 ml  ? ? ?Wt Readings from Last 3 Encounters:  ?02/27/22 (!) 163.2 kg  ?02/07/22 (!) 170.3 kg  ?11/26/20 (!) 164.2 kg  ? ? ?Examination: ? ?Constitutional: NAD ?Respiratory: CTA ?Cardiovascular: RRR  ? ? ?Data Reviewed: I have independently reviewed following labs and imaging studies ? ?CBC ?Recent Labs  ?Lab 02/24/22 ?0351 02/25/22 ?0143 02/26/22 ?0500 02/27/22 ?0456 02/28/22 ?0314  ?WBC 5.9 6.2 5.2 5.6 4.9  ?HGB 11.8* 11.1* 11.4*  11.5* 11.4*  ?HCT 38.4* 36.2* 37.2* 36.9* 37.5*  ?PLT 170 158 178 208 204  ?MCV 87.9 87.7 86.7 85.8 87.4  ?MCH 27.0 26.9 26.6 26.7 26.6  ?MCHC 30.7 30.7 30.6 31.2 30.4  ?RDW 13.0 12.9 13.0 12.9 13.0  ?LYMPHSABS 1.1 0.9 0.9 1.0 0.9  ?MONOABS 0.8 0.8 0.8 0.7 0.7  ?EOSABS 0.2 0.2 0.2 0.2 0.2  ?BASOSABS 0.0 0.0 0.0 0.0 0.1  ? ? ? ?Recent Labs  ?Lab 02/23/22 ?1638 02/23/22 ?1644 02/23/22 ?1730 02/23/22 ?1737 02/23/22 ?2022 02/24/22 ?0351 02/24/22 ?1000 02/25/22 ?0143 02/25/22 ?1029 02/25/22 ?1441 02/26/22 ?0500 02/27/22 ?0456 02/28/22 ?0314  ?NA  --    < > 135  --   --  134*  --  135  --   --  138 136 137  ?K  --    < > 4.1  --   --  3.9  --  3.9  --   --  4.1 3.4* 4.0  ?CL  --   --  97*  --   --  96*  --  94*  --   --  95* 95* 95*  ?CO2  --   --  30  --   --  32  --  33*  --   --  34* 34* 32  ?GLUCOSE  --   --  108*  --   --  114*  --  108*  --   --  118* 126* 112*  ?BUN  --   --  21  --   --  18  --  14  --   --  '14 12 8  '$ ?CREATININE  --   --  1.29*  --   --  1.21  --  1.15  --   --  1.20 1.30* 1.14  ?CALCIUM  --   --  8.7*  --   --  8.7*  --  8.6*  --   --  8.9 8.7* 8.8*  ?AST  --    < > 124*  --   --  96*  --  65*  --   --  49* 44* 38  ?ALT  --    < > 60*  --   --  55*  --  50*  --   --  47* 51* 46*  ?ALKPHOS  --    < > 60  --   --  63  --  58  --   --  59 62 63  ?BILITOT  --    < > 0.6  --   --  0.5  --  0.9  --   --  0.7 0.5 0.1*  ?ALBUMIN  --   --  3.2*  --   --  3.1*  --  2.9*  --   --  2.8* 2.8* 3.0*  ?MG  --    < > 2.4  --   --  2.4  --  1.9  --   --  1.8 2.0 1.9  ?DDIMER  --   --   --   --   --   --   --   --  7.48*  --   --   --   --   ?PROCALCITON  --   --  0.43  --   --  0.34  --  0.59  --   --   --   --   --   ?LATICACIDVEN  --   --  0.9  --  0.8  --   --   --   --   --   --   --   --   ?TSH  --   --   --  1.467  --   --   --   --   --   --   --   --   --   ?AMMONIA 57*  --   --   --   --   --  46*  --   --  25  --   --   --   ?BNP  --   --   --   --   --   --   --   --  17.4  --   --   --   --   ? < > =  values in this interval not displayed.  ? ? ? ?------------------------------------------------------------------------------------------------------------------ ?No results for input(s): CHOL, HDL, LDLCALC, TRIG,

## 2022-03-01 NOTE — Progress Notes (Addendum)
Physical Therapy Treatment ?Patient Details ?Name: JOANDY BURGET ?MRN: [id number] ?DOB: 09-23-1956 ?Today's Date: 03/01/2022 ? ? ?History of Present Illness Pt is 66 yo male who was suffering from claudication pain with progressive foot weakness and incontinence. Presented for TLIF L4-5, L5-S1. PMH: GERD, HTN, asth,a. arthritis, anxiety and depression. ? ?  ?PT Comments  ? ? Pt was able to transfer sit to stand and ambulate in the hallway with supervision for line management. His activity tolerance is improving but still limited, requiring increased supplemental O2 during activity to maintain O2 saturation. Upon standing up from the commode he had an instance of shooting pain down his left leg that resolved with sitting back down. Pain did not occur again during the session During treatment session patient showed deficits in endurance and activity tolerance as seen by requiring 6 L of supplemental O2 to ambulate and he still experiences dyspnea and drop in SPaO2. Balance remains tenuous, requires UE support for all standing activities. Recommending therapy services at skilled nursing facility to address the previously stated deficits. Will continue to follow acutely to maximize functional mobility, independence, and safety. ?  ?Recommendations for follow up therapy are one component of a multi-disciplinary discharge planning process, led by the attending physician.  Recommendations may be updated based on patient status, additional functional criteria and insurance authorization. ? ?Follow Up Recommendations ? Skilled nursing-short term rehab (<3 hours/day) ?  ?  ?Assistance Recommended at Discharge Frequent or constant Supervision/Assistance  ?Patient can return home with the following Assistance with cooking/housework;Assist for transportation;Help with stairs or ramp for entrance;Direct supervision/assist for medications management;A lot of help with walking and/or transfers;A lot of help with  bathing/dressing/bathroom ?  ?Equipment Recommendations ? None recommended by PT  ?  ?Recommendations for Other Services   ? ? ?  ?Precautions / Restrictions Precautions ?Precautions: Back ?Precaution Comments: pt was able to recall 3/3 back precautions. ?Restrictions ?Weight Bearing Restrictions: No  ?  ? ?Mobility ? Bed Mobility ?  ?  ?  ?  ?  ?  ?  ?General bed mobility comments: Pt was up in the recliner upon the start of the session. ?  ? ?Transfers ?Overall transfer level: Needs assistance ?Equipment used: Rolling walker (2 wheels) ?Transfers: Sit to/from Stand ?Sit to Stand: Supervision ?  ?Step pivot transfers: Min assist ?  ?  ?  ?General transfer comment: Pt was supervision with sit to stand transfer for line managment and safety. ?  ? ?Ambulation/Gait ?Ambulation/Gait assistance: Supervision ?Gait Distance (Feet): 75 Feet (x2 with seated rest break,) ?Assistive device: Rolling walker (2 wheels) ?Gait Pattern/deviations: Step-through pattern, Decreased stride length, Drifts right/left ?  ?  ?  ?General Gait Details: Pt requried supervision with ambuation and help with line managment and supplemental O2 tank. Pt reported fatigue in his legs upon completion of the walk. ? ? ?Stairs ?  ?  ?  ?  ?  ? ? ?Wheelchair Mobility ?  ? ?Modified Rankin (Stroke Patients Only) ?  ? ? ?  ?Balance Overall balance assessment: Modified Independent ?  ?Sitting balance-Leahy Scale: Good ?  ?  ?  ?Standing balance-Leahy Scale: Fair ?Standing balance comment: Pt was dependent on walker for balance but was supervision for standing and functional tasks. ?  ?  ?  ?  ?  ?  ?  ?  ?  ?  ?  ?  ? ?  ?Cognition Arousal/Alertness: Awake/alert ?Behavior During Therapy: [facility] for tasks assessed/performed ?Overall Cognitive Status: Within Functional  Limits for tasks assessed ?  ?  ?  ?  ?  ?  ?  ?  ?  ?  ?  ?  ?  ?  ?  ?  ?  ?  ?  ? ?  ?Exercises   ? ?  ?General Comments General comments (skin integrity, edema, etc.): Pt's SPaO2 stayed in the  90s throughout the duration of the walk. Pt was on 6L of supplemental O2 during walking in the hallway. ?  ?  ? ?Pertinent Vitals/Pain Pain Assessment ?Pain Assessment: No/denies pain  ? ? ?Home Living   ?  ?  ?  ?  ?  ?  ?  ?  ?  ?   ?  ?Prior Function    ?  ?  ?   ? ?PT Goals (current goals can now be found in the care plan section)   ? ?  ?Frequency ? ? ? Min 5X/week ? ? ? ?  ?PT Plan Current plan remains appropriate  ? ? ?Co-evaluation   ?  ?  ?  ?  ? ?  ?AM-PAC PT "6 Clicks" Mobility   ?Outcome Measure ? Help needed turning from your back to your side while in a flat bed without using bedrails?: A Little ?Help needed moving from lying on your back to sitting on the side of a flat bed without using bedrails?: A Little ?Help needed moving to and from a bed to a chair (including a wheelchair)?: A Little ?Help needed standing up from a chair using your arms (e.g., wheelchair or bedside chair)?: A Little ?Help needed to walk in hospital room?: A Little ?Help needed climbing 3-5 steps with a railing? : Total ?6 Click Score: 16 ? ?  ?End of Session Equipment Utilized During Treatment: Oxygen;Gait belt ?Activity Tolerance: Patient tolerated treatment well ?Patient left: with call bell/phone within reach;in chair ?Nurse Communication: Mobility status ?PT Visit Diagnosis: Unsteadiness on feet (R26.81);Muscle weakness (generalized) (M62.81);Difficulty in walking, not elsewhere classified (R26.2);Pain ?  ? ? ?Time: 1440-1520 ?PT Time Calculation (min) (ACUTE ONLY): 40 min ? ?Charges:  $Gait Training: 8-22 mins ?$Therapeutic Activity: 23-37 mins          ?          ? ?Quenton Fetter, SPT ? ? ? ?Quenton Fetter ?03/01/2022, 5:34 PM ? ?

## 2022-03-01 NOTE — Progress Notes (Signed)
Neurosurgery Service ?Progress Note ? ?Subjective: No acute events overnight, no new complaints ? ?Objective: ?Vitals:  ? 02/28/22 1910 02/28/22 2335 03/01/22 0320 03/01/22 0500  ?BP: 136/73 (!) 144/72 (!) 141/81   ?Pulse: 85 98 83 87  ?Resp: '14 15 15 14  '$ ?Temp: 97.9 ?F (36.6 ?C) 98.8 ?F (37.1 ?C) 97.8 ?F (36.6 ?C)   ?TempSrc: Axillary Oral Axillary   ?SpO2: 97% 95% 95% 97%  ?Weight:      ?Height:      ? ? ?Physical Exam: ?Aox3, Strength 5/5 ?SILTx4 except bilateral stocking numbness  ? ?Assessment & Plan: ?66 y.o. man s/p 2 level MIS decompression and TLIF, recovering well. ? ?-medicine, PCCM recs for acute on chronic hypercarbic respiratory failure, AKI, acute metabolic encephalopathy ?-SNF discharge pending above ?-activity as tolerated, no brace needed ?-SCDs/TEDs, SQH ? ?Kenneth Davila  ?03/01/22 ?5:59 AM ? ?

## 2022-03-01 NOTE — TOC Progression Note (Signed)
Transition of Care (TOC) - Progression Note  ? ? ?Patient Details  ?Name: Kenneth Davila ?MRN: [id number] ?Date of Birth: 1956/01/18 ? ?Transition of Care (TOC) CM/SW Contact  ?Vinie Sill, LCSW ?Phone Number: ?03/01/2022, 10:00 AM ? ?Clinical Narrative:    ? ?[facility] has no bed offers at this time. Admissions for [facility] as been informed patient's agitation and confusion has been resolved. They will review and then follow up with CSW. ? ?TOC will continue to follow and assist with discharge planning. ? ?Thurmond Butts, MSW, LCSW ?Clinical Social Worker ? ? ? ?Expected Discharge Plan: [place] ?Barriers to Discharge: Ship broker, Continued Medical Work up, SNF Pending bed offer (obesity) ? ?Expected Discharge Plan and Services ?Expected Discharge Plan: [place] ?In-house Referral: Clinical Social Work ?  ?  ?  ?                ?  ?  ?  ?  ?  ?  ?  ?  ?  ?  ? ? ?Social Determinants of Health (SDOH) Interventions ?  ? ?Readmission Risk Interventions ?No flowsheet data found. ? ?

## 2022-03-02 DIAGNOSIS — J9601 Acute respiratory failure with hypoxia: Secondary | ICD-10-CM | POA: Diagnosis not present

## 2022-03-02 DIAGNOSIS — J9602 Acute respiratory failure with hypercapnia: Secondary | ICD-10-CM | POA: Diagnosis not present

## 2022-03-02 MED ORDER — OXYCODONE HCL 5 MG PO TABS: 5.0000 mg | ORAL_TABLET | ORAL | 0 refills | Status: DC | PRN

## 2022-03-02 NOTE — Progress Notes (Signed)
SATURATION QUALIFICATIONS: (This note is used to comply with regulatory documentation for home oxygen) ? ?Patient Saturations on Room Air at Rest = 92% ? ?Patient Saturations on Room Air while Ambulating = 80% ? ?Patient Saturations on 5 Liters of oxygen while Ambulating = 91% ? ?Please briefly explain why patient needs home oxygen:  Patient hypoxic ambulating on room air.  ? ?Magda Kiel, PT ?Acute Rehabilitation Services ?JGGEZ:[phone] ?Office:[phone] ?03/02/2022 ? ?

## 2022-03-02 NOTE — Progress Notes (Signed)
?PROGRESS NOTE ? ?Kenneth Davila ZOX:[id number] DOB: Nov 06, 1956 DOA: 02/19/2022 ?PCP: Nolene Ebbs, MD ? ? LOS: 11 days  ? ?Brief Narrative / Interim history: ?66 yo male with lumbar stenosis and claudication admitted for lumbar laminectomy done on 02/19/22.  He has hx of developed lethargy and hypoxia on 02/21/22.  ABG showed acute on chronic hypercapnia.  He was started on Bipap.  He was found to have elevated ammonia level of 57 also, and started on lactulose.  He developed agitation and confusion, and had difficulty keeping medical equipment on.  ? ?Events ?3/06 lumbar laminectomy, foley placed by urology due to retention ?3/08 start on Bipap ?3/10 hospitalist consulted ?3/12 PCCM consulted due to agitation, on Precedex ?3/15 back to hospitalist service ? ?Subjective / 24h Interval events: ?No complaints, no shortness of breath. Wants to go home, not SNF ? ?Assesement and Plan: ?Principal Problem: ?  Acute respiratory failure with hypoxia and hypercarbia (HCC) ?Active Problems: ?  GERD (gastroesophageal reflux disease) ?  Chronic pain syndrome ?  Tobacco dependence ?  Diabetes mellitus type 2, diet-controlled ([place]) ?  Morbid obesity ([place]) ?  Lumbar stenosis with neurogenic claudication ?  Hyperammonemia ([place]) ?  AKI (acute kidney injury) ([place]) ?  Normocytic anemia ?  Abnormal LFTs ?  Obesity hypoventilation syndrome ([place]) ?  Agitation ?  Aortic aneurysm ([place]) ?  Hilar lymphadenopathy ? ? ?Assessment and Plan: ?Principal problem ?Acute on chronic hypoxic, hypercapnic respiratory failure -likely from OSA/OHS exacerbated by opiate pain medication use after surgery. His sleep study from 11/26/20 showed severe sleep apnea with an AHI of 121 and SpO2 low of 51%. Continue BiPAP while he is at sleep and switch to nasal cannula oxygen during daytime. Needs to continue to use CPAP on discharge. Apparently his home CPAP has been recalled and he is in the process if changing it, advised patient to do that ASAP. Limit opiates.  Advised compliance with home O2, he admits to not using it regularly ? ?Stable for discharge from medicine standpoint. ? ?Active problems ?Acute metabolic encephalopathy 2nd to hypoxia / hypercapnia, agitation ?-resolved, no longer confused or agitated, he is back to baseline ? ?S/p lumbar laminectomy -Post op care per neurosurgery ?  ?Acute urine retention -Foley placed by urology on 3/06. Completed Keflex for 5 days per urology recommendations. Foley removed 3/14, now urinating on his own ?  ?Elevated ammonia level -resolved. Does not need lactulose on dc ?  ?Hilar lymphadenopathy -CTA showed "Mildly enlarged mediastinal and hilar nodes but they are less enlarged than previously suggesting the previous nodal enlargement was probably due to reactive hyperplasia".  Follow-up chest CT in 12 ?months may be considered to ensure stability. ? ?Aortic aneurysm (HCC) -CT Scan showed "Evaluation of the aorta demonstrates a borderline aneurysmal aortic root measuring 3.9 cm, ectatic ascending segment 3.7 cm, and the remainder within normal caliber limits. No aortic dissection or penetrating ulcer." Outpatient Follow up with Vascular and Repeat Imaging  ? ?Abnormal LFTs - Unclear etiology but could be reactive and is now trending down ? ?Normocytic anemia -stable ? ?AKI (acute kidney injury) ([place]) -resolved ? ?Obesity, class III -Complicates overall prognosis and care ? ? ?Scheduled Meds: ? Chlorhexidine Gluconate Cloth  6 each Topical Daily  ? docusate sodium  100 mg Oral BID  ? heparin injection (subcutaneous)  5,000 Units Subcutaneous Q8H  ? lactulose  20 g Oral BID  ? LORazepam  2 mg Intravenous Once  ? mouth rinse  15 mL Mouth Rinse  BID  ? pregabalin  150 mg Oral Daily  ? QUEtiapine  50 mg Oral QHS  ? sodium chloride flush  3 mL Intravenous Q12H  ? ?Continuous Infusions: ? sodium chloride    ? ?PRN Meds:.acetaminophen **OR** acetaminophen, cyclobenzaprine, haloperidol lactate, HYDROmorphone (DILAUDID) injection,  ipratropium, levalbuterol, LORazepam, menthol-cetylpyridinium **OR** phenol, naLOXone (NARCAN)  injection, ondansetron **OR** ondansetron (ZOFRAN) IV, oxyCODONE, oxyCODONE, polyethylene glycol, sodium chloride flush ? ?Diet Orders (From admission, onward)  ? ?  Start     Ordered  ? 02/20/22 0757  Diet regular Room service appropriate? Yes with Assist; Fluid consistency: Thin  Diet effective now       ?Question Answer Comment  ?Room service appropriate? Yes with Assist   ?Fluid consistency: Thin   ?  ? 02/20/22 0756  ? ?  ?  ? ?  ? ? ?DVT prophylaxis: heparin injection 5,000 Units Start: 02/21/22 0915 ?SCD's Start: 02/19/22 1834 ? ? ?Lab Results  ?Component Value Date  ? PLT 204 02/28/2022  ? ? ?  Code Status: Full Code ? ?Family Communication: no family at bedside  ? ?Status is: Inpatient ? ?Objective: ?Vitals:  ? 03/01/22 2305 03/02/22 0254 03/02/22 0305 03/02/22 0742  ?BP: (!) 146/87  135/73 (!) 145/79  ?Pulse: 97 85 88 88  ?Resp: '14 12 15 13  '$ ?Temp: 98.4 ?F (36.9 ?C)  98.6 ?F (37 ?C) 97.9 ?F (36.6 ?C)  ?TempSrc: Axillary  Oral Oral  ?SpO2: 97% 97% 99% 98%  ?Weight:      ?Height:      ? ? ?Intake/Output Summary (Last 24 hours) at 03/02/2022 0841 ?Last data filed at 03/02/2022 0548 ?Gross per 24 hour  ?Intake 480 ml  ?Output 300 ml  ?Net 180 ml  ? ? ?Wt Readings from Last 3 Encounters:  ?02/27/22 (!) 163.2 kg  ?02/07/22 (!) 170.3 kg  ?11/26/20 (!) 164.2 kg  ? ? ?Examination: ? ?Constitutional: NAD ?Respiratory: CTA ?Cardiovascular: RRR ? ? ?Data Reviewed: I have independently reviewed following labs and imaging studies ? ?CBC ?Recent Labs  ?Lab 02/24/22 ?0351 02/25/22 ?0143 02/26/22 ?0500 02/27/22 ?0456 02/28/22 ?0314  ?WBC 5.9 6.2 5.2 5.6 4.9  ?HGB 11.8* 11.1* 11.4* 11.5* 11.4*  ?HCT 38.4* 36.2* 37.2* 36.9* 37.5*  ?PLT 170 158 178 208 204  ?MCV 87.9 87.7 86.7 85.8 87.4  ?MCH 27.0 26.9 26.6 26.7 26.6  ?MCHC 30.7 30.7 30.6 31.2 30.4  ?RDW 13.0 12.9 13.0 12.9 13.0  ?LYMPHSABS 1.1 0.9 0.9 1.0 0.9  ?MONOABS 0.8 0.8 0.8  0.7 0.7  ?EOSABS 0.2 0.2 0.2 0.2 0.2  ?BASOSABS 0.0 0.0 0.0 0.0 0.1  ? ? ? ?Recent Labs  ?Lab 02/23/22 ?1638 02/23/22 ?1644 02/23/22 ?1730 02/23/22 ?1737 02/23/22 ?2022 02/24/22 ?0351 02/24/22 ?1000 02/25/22 ?0143 02/25/22 ?1029 02/25/22 ?1441 02/26/22 ?0500 02/27/22 ?0456 02/28/22 ?0314  ?NA  --    < > 135  --   --  134*  --  135  --   --  138 136 137  ?K  --    < > 4.1  --   --  3.9  --  3.9  --   --  4.1 3.4* 4.0  ?CL  --   --  97*  --   --  96*  --  94*  --   --  95* 95* 95*  ?CO2  --   --  30  --   --  32  --  33*  --   --  34* 34* 32  ?  GLUCOSE  --   --  108*  --   --  114*  --  108*  --   --  118* 126* 112*  ?BUN  --   --  21  --   --  18  --  14  --   --  '14 12 8  '$ ?CREATININE  --   --  1.29*  --   --  1.21  --  1.15  --   --  1.20 1.30* 1.14  ?CALCIUM  --   --  8.7*  --   --  8.7*  --  8.6*  --   --  8.9 8.7* 8.8*  ?AST  --    < > 124*  --   --  96*  --  65*  --   --  49* 44* 38  ?ALT  --    < > 60*  --   --  55*  --  50*  --   --  47* 51* 46*  ?ALKPHOS  --    < > 60  --   --  63  --  58  --   --  59 62 63  ?BILITOT  --    < > 0.6  --   --  0.5  --  0.9  --   --  0.7 0.5 0.1*  ?ALBUMIN  --   --  3.2*  --   --  3.1*  --  2.9*  --   --  2.8* 2.8* 3.0*  ?MG  --    < > 2.4  --   --  2.4  --  1.9  --   --  1.8 2.0 1.9  ?DDIMER  --   --   --   --   --   --   --   --  7.48*  --   --   --   --   ?PROCALCITON  --   --  0.43  --   --  0.34  --  0.59  --   --   --   --   --   ?LATICACIDVEN  --   --  0.9  --  0.8  --   --   --   --   --   --   --   --   ?TSH  --   --   --  1.467  --   --   --   --   --   --   --   --   --   ?AMMONIA 57*  --   --   --   --   --  46*  --   --  25  --   --   --   ?BNP  --   --   --   --   --   --   --   --  17.4  --   --   --   --   ? < > = values in this interval not displayed.  ? ? ? ?------------------------------------------------------------------------------------------------------------------ ?No results for input(s): CHOL, HDL, LDLCALC, TRIG, CHOLHDL, LDLDIRECT in the last 72  hours. ? ?Lab Results  ?Component Value Date  ? HGBA1C 6.4 (H) 12/22/2019  ? ?------------------------------------------------------------------------------------------------------------------ ?No results for input

## 2022-03-02 NOTE — Progress Notes (Signed)
Neurosurgery Service ?Progress Note ? ?Subjective: No acute events overnight, no new complaints ? ?Objective: ?Vitals:  ? 03/01/22 2251 03/01/22 2305 03/02/22 0254 03/02/22 0305  ?BP:  (!) 146/87  135/73  ?Pulse: 93 97 85 88  ?Resp: '12 14 12 15  '$ ?Temp:  98.4 ?F (36.9 ?C)  98.6 ?F (37 ?C)  ?TempSrc:  Axillary  Oral  ?SpO2: 97% 97% 97% 99%  ?Weight:      ?Height:      ? ? ?Physical Exam: ?Aox3, Strength 5/5 ?SILTx4 except bilateral stocking numbness  ? ?Assessment & Plan: ?66 y.o. man s/p 2 level MIS decompression and TLIF, recovering well. ? ?-medicine recs ?-SNF discharge pending bed availability ?-activity as tolerated, no brace needed ?-SCDs/TEDs, SQH ? ?Kenneth Davila  ?03/02/22 ?6:57 AM ? ?

## 2022-03-02 NOTE — Progress Notes (Signed)
Physical Therapy Treatment ?Patient Details ?Name: Kenneth Davila ?MRN: [id number] ?DOB: [birth year] ?Today's Date: 03/02/2022 ? ? ?History of Present Illness Pt is 66 yo male who was suffering from claudication pain with progressive foot weakness and incontinence. Presented for TLIF L4-5, L5-S1. PMH: GERD, HTN, asth,a. arthritis, anxiety and depression. ? ?  ?PT Comments  ? ? Pt ambulated with supervision for lines and O2 management. He dropped to 85 SPaO2 on 6L of O2 indicating activity tolerance deficits and need for continued therapy. He performed side stepping exercise with light US of the wall for balance. He required assistance with bathroom hygiene after having a bowl movement. Pt indicates that he wishes to go home and he will be able to have assistance at home when he needs assistance. During treatment session patient showed deficits in endurance and activity tolerance. Continuing to recommend therapy services at skilled nursing facility to address the previously stated deficits. As pt may be hard to place at a skilled nursing facility pt will be able to return home with frequent intermittent assistance and appropriate supplemental O2 needs.  Will continue to follow acutely to maximize functional mobility, independence, and safety. ?  ?Recommendations for follow up therapy are one component of a multi-disciplinary discharge planning process, led by the attending physician.  Recommendations may be updated based on patient status, additional functional criteria and insurance authorization. ? ?Follow Up Recommendations ? Skilled nursing-short term rehab (<3 hours/day) ?  ?  ?Assistance Recommended at Discharge Intermittent Supervision/Assistance  ?Patient can return home with the following Assistance with cooking/housework;Assist for transportation;Help with stairs or ramp for entrance;Direct supervision/assist for medications management;A lot of help with bathing/dressing/bathroom;A little help with walking  and/or transfers ?  ?Equipment Recommendations ? None recommended by PT  ?  ?Recommendations for Other Services   ? ? ?  ?Precautions / Restrictions Precautions ?Precautions: Back ?Precaution Booklet Issued: Yes (comment) ?Precaution Comments: pt was able to recall 3/3 back precautions. ?Restrictions ?Weight Bearing Restrictions: No  ?  ? ?Mobility ? Bed Mobility ?Overal bed mobility: Needs Assistance ?Bed Mobility: Sidelying to Sit, Sit to Sidelying, Rolling ?Rolling: Modified independent (Device/Increase time) ?Sidelying to sit: Modified independent (Device/Increase time), HOB elevated ?  ?Sit to supine: Supervision ?  ?  ?  ? ?Transfers ?  ?Equipment used: Rolling walker (2 wheels) ?Transfers: Sit to/from Stand ?Sit to Stand: Modified independent (Device/Increase time) ?  ?  ?  ?  ?  ?General transfer comment: Pt was supervision with sit to stand transfer for line managment and safety and O2 management. ?  ? ?Ambulation/Gait ?Ambulation/Gait assistance: Supervision ?Gait Distance (Feet): 75 Feet ?Assistive device: Rolling walker (2 wheels) ?Gait Pattern/deviations: Step-through pattern, Decreased stride length, Drifts right/left ?  ?  ?  ?General Gait Details: Pt requried supervision with ambuation and help with line managment and supplemental O2 tank. Pt reports less fatigue in his legs with walking. ? ? ?Stairs ?  ?  ?  ?  ?  ? ? ?Wheelchair Mobility ?  ? ?Modified Rankin (Stroke Patients Only) ?  ? ? ?  ?Balance Overall balance assessment: Needs assistance ?Sitting-balance support: Feet supported, Bilateral upper extremity supported ?Sitting balance-Leahy Scale: Good ?Sitting balance - Comments: Pt is independent with sitting balance. ?  ?Standing balance support: Bilateral upper extremity supported, During functional activity, Reliant on assistive device for balance ?Standing balance-Leahy Scale: Fair ?Standing balance comment: Pt was dependent on walker for balance but was supervision for standing and  functional tasks. ?  ?  ?  ?  ?  ?  ?  High level balance activites: Side stepping ?High Level Balance Comments: Pt was able to side step 15 ft right and 15 ft left with light use of the wall for balance. ?  ?  ?  ?  ? ?  ?Cognition Arousal/Alertness: Awake/alert ?Behavior During Therapy: [facility] for tasks assessed/performed ?Overall Cognitive Status: Within Functional Limits for tasks assessed ?  ?  ?  ?  ?  ?  ?  ?  ?  ?  ?  ?  ?  ?  ?  ?  ?  ?  ?  ? ?  ?Exercises   ? ?  ?General Comments General comments (skin integrity, edema, etc.): Pt's SPaO2 dropped to 85 with ambulation on 6L of supplemental O2 during ambulation. ?  ?  ? ?Pertinent Vitals/Pain Pain Assessment ?Pain Assessment: Faces ?Faces Pain Scale: Hurts a little bit ?Pain Location: Pt has no pain at rest but reports pain with certain movements. ?Pain Descriptors / Indicators: Guarding  ? ? ?Home Living   ?  ?  ?  ?  ?  ?  ?  ?  ?  ?   ?  ?Prior Function    ?  ?  ?   ? ?PT Goals (current goals can now be found in the care plan section)   ? ?  ?Frequency ? ? ? Min 5X/week ? ? ? ?  ?PT Plan Current plan remains appropriate  ? ? ?Co-evaluation   ?  ?  ?  ?  ? ?  ?AM-PAC PT "6 Clicks" Mobility   ?Outcome Measure ? Help needed turning from your back to your side while in a flat bed without using bedrails?: A Little ?Help needed moving from lying on your back to sitting on the side of a flat bed without using bedrails?: A Little ?Help needed moving to and from a bed to a chair (including a wheelchair)?: A Little ?Help needed standing up from a chair using your arms (e.g., wheelchair or bedside chair)?: A Little ?Help needed to walk in hospital room?: A Little ?Help needed climbing 3-5 steps with a railing? : Total ?6 Click Score: 16 ? ?  ?End of Session Equipment Utilized During Treatment: Oxygen;Gait belt ?Activity Tolerance: Patient tolerated treatment well ?Patient left: in bed;with nursing/sitter in room;with family/visitor present ?Nurse Communication: Mobility  status;Other (comment) (O2 Stats) ?PT Visit Diagnosis: Unsteadiness on feet (R26.81);Muscle weakness (generalized) (M62.81);Difficulty in walking, not elsewhere classified (R26.2);Pain ?  ? ? ?Time: 1507-1600 ?PT Time Calculation (min) (ACUTE ONLY): 53 min ? ?Charges:  $Gait Training: 23-37 mins ?$Therapeutic Activity: 23-37 mins          ?          ? ?Quenton Fetter, SPT ? ? ? ?Quenton Fetter ?03/02/2022, 4:34 PM ? ?

## 2022-03-02 NOTE — TOC Progression Note (Addendum)
Transition of Care (TOC) - Progression Note  ? ? ?Patient Details  ?Name: ROCIO WOLAK ?MRN: [id number] ?Date of Birth: [birth year] ? ?Transition of Care (TOC) CM/SW Contact  ?Angelita Ingles, RN ?Phone Number:[phone] ? ?03/02/2022, 1:14 PM ? ?Clinical Narrative:    ?TOC following for HH needs. CM unable to obtain [facility] agency. Patient made aware and is agreeable to outpatient therapy. Patient states that he does have transportation. MD will need to enter ambulatory referral for outpatient therapy orders. Patient states that he is ready to go home. CM has advised the patient that MD will be the one to make that decision.  ? ?1320 NIV has been approved and ready for delivery per [employer] [facility].  ? ?83 Per nurse Dr. Zada Finders is aware and will enter ambulatory referral orders. CM has also been made aware that patients O2 sats have been dropping while ambulating. Patient reported to his nurse that he has O2 at home. Cm at bedside and patient states that he has O2 per [employer] but then patient says he has his brothers tanks. CM made patient and nurse aware that patient would need to qualify for home O2 and obtain new script. [employer] has been made aware that patient will most likely need O2 but [employer] can not process the referral until they receive qualification screen and updated order.  ?Once patient qualifies for home O2 and you have orders please call [employer] [facility] at [phone]. Home O2 can be delivered. The onsite delivery team is in the building until about 5:30pm.  O2 can be delivered to the home at later times.  ? ?Expected Discharge Plan: [place] ?Barriers to Discharge: Ship broker, Continued Medical Work up, SNF Pending bed offer (obesity) ? ?Expected Discharge Plan and Services ?Expected Discharge Plan: [place] ?In-house Referral: Clinical Social Work ?  ?  ?  ?                ?  ?  ?  ?  ?  ?  ?  ?  ?  ?  ? ? ?Social Determinants of Health (SDOH) Interventions ?   ? ?Readmission Risk Interventions ?No flowsheet data found. ? ?

## 2022-03-02 NOTE — Progress Notes (Signed)
Occupational Therapy Treatment ?Patient Details ?Name: Kenneth Davila ?MRN: [id number] ?DOB: 18-Feb-1956 ?Today's Date: 03/02/2022 ? ? ?History of present illness Pt is 66 yo male who was suffering from claudication pain with progressive foot weakness and incontinence. Presented for TLIF L4-5, L5-S1. PMH: GERD, HTN, asth,a. arthritis, anxiety and depression. ?  ?OT comments ? Patient with continued progress toward patient focused goals.  Continues with discomfort to low back and L foot, but it is not impacting his ability to participate.  Overall improved bed mobility, only needing up to Min A for lying down, and generalized supervision for basic stand pivot transfers.  Patient able to stand for urinal use, but declines out of bed to recliner at this time.  OT will continue efforts in the acute setting with SNF being recommended for post acute rehab prior to home.  HH therapies could be considered, if he progresses, but unfortunately he does not have anyone to assist him at home.    ? ?Recommendations for follow up therapy are one component of a multi-disciplinary discharge planning process, led by the attending physician.  Recommendations may be updated based on patient status, additional functional criteria and insurance authorization. ?   ?Follow Up Recommendations ? Skilled nursing-short term rehab (<3 hours/day)  ?  ?Assistance Recommended at Discharge Frequent or constant Supervision/Assistance  ?Patient can return home with the following ? A little help with walking and/or transfers;A lot of help with bathing/dressing/bathroom;Assist for transportation ?  ?Equipment Recommendations ?    ?  ?Recommendations for Other Services   ? ?  ?Precautions / Restrictions Precautions ?Precautions: Back ?Restrictions ?Weight Bearing Restrictions: No  ? ? ?  ? ?Mobility Bed Mobility ?Overal bed mobility: Needs Assistance ?Bed Mobility: Sidelying to Sit, Sit to Sidelying ?  ?Sidelying to sit: Modified independent  (Device/Increase time), HOB elevated ?  ?  ?Sit to sidelying: Min assist ?General bed mobility comments: assist with getting his feet back on the bed ?  ? ?Transfers ?Overall transfer level: Needs assistance ?Equipment used: Rolling walker (2 wheels) ?Transfers: Sit to/from Stand ?Sit to Stand: Supervision ?  ?  ?  ?  ?  ?  ?  ?  ?Balance Overall balance assessment: Needs assistance ?Sitting-balance support: Feet supported, Bilateral upper extremity supported ?Sitting balance-Leahy Scale: Good ?  ?  ?  ?  ?  ?  ?  ?  ?  ?  ?  ?  ?  ?  ?  ?  ?   ? ?ADL either performed or assessed with clinical judgement  ? ?ADL   ?  ?  ?  ?  ?  ?  ?  ?  ?Upper Body Dressing : Set up;Sitting ?  ?Lower Body Dressing: Moderate assistance;Sit to/from stand ?Lower Body Dressing Details (indicate cue type and reason): difficulty reaching feet ?  ?  ?  ?  ?  ?  ?  ?  ?  ? ?Extremity/Trunk Assessment Upper Extremity Assessment ?Upper Extremity Assessment: Overall WFL for tasks assessed ?  ?Lower Extremity Assessment ?Lower Extremity Assessment: Defer to PT evaluation ?  ?Cervical / Trunk Assessment ?Cervical / Trunk Assessment: Back Surgery ?  ? ?   ?  ?  ?   ?  ?   ?  ? ?Cognition Arousal/Alertness: Awake/alert ?Behavior During Therapy: [facility] for tasks assessed/performed ?Overall Cognitive Status: Within Functional Limits for tasks assessed ?  ?  ?  ?  ?  ?  ?  ?  ?  ?  ?  ?  ?  ?  ?  ?  ?  ?  ?  ?   ?   ? ?  ?   ? ? ?  ?    ? ? ?  Pertinent Vitals/ Pain       Pain Assessment ?Faces Pain Scale: Hurts a little bit ?Pain Location: low back, left ankle/foot ?Pain Descriptors / Indicators: Guarding ?Pain Intervention(s): Monitored during session ? ?   ?  ?  ?  ?  ?  ?  ?  ?  ?  ?  ?  ?  ?  ?  ?  ?  ?  ?  ? ?  ?    ?  ?  ?  ?   ? ?Frequency ? Min 2X/week  ? ? ? ? ?  ?Progress Toward Goals ? ?OT Goals(current goals can now be found in the care plan section) ? Progress towards OT goals: Progressing toward goals ? ?Acute Rehab OT Goals ?OT Goal  Formulation: With patient ?Time For Goal Achievement: 03/06/22 ?Potential to Achieve Goals: Good  ?Plan Discharge plan remains appropriate;Frequency remains appropriate   ? ?Co-evaluation ? ? ?   ?  ?  ?  ?  ? ?  ?AM-PAC OT "6 Clicks" Daily Activity     ?Outcome Measure ? ? Help from another person eating meals?: None ?Help from another person taking care of personal grooming?: None ?Help from another person toileting, which includes using toliet, bedpan, or urinal?: A Little ?Help from another person bathing (including washing, rinsing, drying)?: A Lot ?Help from another person to put on and taking off regular upper body clothing?: A Little ?Help from another person to put on and taking off regular lower body clothing?: A Lot ?6 Click Score: 18 ? ?  ?End of Session Equipment Utilized During Treatment: Rolling walker (2 wheels);Oxygen ? ?OT Visit Diagnosis: Unsteadiness on feet (R26.81);Other abnormalities of gait and mobility (R26.89);Muscle weakness (generalized) (M62.81) ?  ?Activity Tolerance Patient tolerated treatment well ?  ?Patient Left in bed;with call bell/phone within reach ?  ?Nurse Communication Mobility status ?  ? ?   ? ?Time: 5009-3818 ?OT Time Calculation (min): 19 min ? ?Charges: OT General Charges ?$OT Visit: 1 Visit ?OT Treatments ?$Self Care/Home Management : 8-22 mins ? ?03/02/2022 ? ?RP, OTR/L ? ?Acute Rehabilitation Services ? ?Office:  [phone] ? ? ?Paloma Grange D Levell Tavano ?03/02/2022, 9:06 AM ?

## 2022-03-07 NOTE — Op Note (Signed)
Procedure:  Cystoscopy with complex Foley placement ?  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  ?Description: The patient's genitals were prepped in usual fashion.  I attempted to place a 17 [place] coud? catheter, but met resistance in the mid urethra.  I then advanced a 55 French flexible cystoscope into the urethra where a thin mid urethral stricture was identified.  The flexible cystoscope was navigated beyond the area of stricture and into the bladder.  A complete bladder survey revealed no intravesical lesions.  I then advanced a Glidewire through the cystoscope and into the bladder.  The flexible cystoscope was then removed, leaving the Glidewire in place.  A 16 French council tip catheter was then advanced over the wire and into good position within the bladder with return of clear-yellow urine.  10 mL sterile water was then used to inflate the Foley catheter balloon.  The Foley catheter was then placed to gravity drainage.  Dr. Joaquim Nam then proceeded with his portion of the case.  ?
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
## 2022-03-29 ENCOUNTER — Encounter: Payer: Self-pay | Admitting: Student

## 2022-03-29 ENCOUNTER — Other Ambulatory Visit (HOSPITAL_COMMUNITY): Payer: Self-pay

## 2022-03-29 ENCOUNTER — Ambulatory Visit: Payer: Medicaid Other | Admitting: Student

## 2022-03-29 ENCOUNTER — Telehealth: Payer: Self-pay | Admitting: Student

## 2022-03-29 VITALS — BP 136/72 | HR 84 | Temp 98.1°F | Ht 74.0 in | Wt 357.8 lb

## 2022-03-29 DIAGNOSIS — J9612 Chronic respiratory failure with hypercapnia: Secondary | ICD-10-CM | POA: Diagnosis not present

## 2022-03-29 DIAGNOSIS — J432 Centrilobular emphysema: Secondary | ICD-10-CM | POA: Diagnosis not present

## 2022-03-29 DIAGNOSIS — J9611 Chronic respiratory failure with hypoxia: Secondary | ICD-10-CM | POA: Diagnosis not present

## 2022-03-29 MED ORDER — TRELEGY ELLIPTA 100-62.5-25 MCG/ACT IN AEPB
1.0000 | INHALATION_SPRAY | Freq: Every day | RESPIRATORY_TRACT | 0 refills | Status: DC
Start: 2022-03-29 — End: 2022-05-10

## 2022-03-29 MED ORDER — TRELEGY ELLIPTA 100-62.5-25 MCG/ACT IN AEPB: 1.0000 | INHALATION_SPRAY | Freq: Every day | RESPIRATORY_TRACT | 11 refills | Status: DC

## 2022-03-29 NOTE — Addendum Note (Signed)
Addended by: Suzzanne Cloud E on: 03/29/2022 11:15 AM ? ? Modules accepted: Orders ? ?

## 2022-03-29 NOTE — Patient Instructions (Addendum)
-   We will request a download from [employer] and labs at next visit ?- PFTs (breathing tests) next visit ?- try to have your PAP device ready at your couch or bed whenever you might fall asleep watching TV ?- trim beard/moustache to improve mask seal, can also try mask liner or ask [employer] about under the nose full face mask. ?- see you in 6 weeks! ? ?

## 2022-03-29 NOTE — Telephone Encounter (Signed)
What is cheapest laba/ICS in case triple therapy  with trelegy ends up being unaffordable? ?

## 2022-03-29 NOTE — Addendum Note (Signed)
Addended by: Maryjane Hurter on: 03/29/2022 11:12 AM ? ? Modules accepted: Orders ? ?
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
## 2022-05-10 ENCOUNTER — Ambulatory Visit (INDEPENDENT_AMBULATORY_CARE_PROVIDER_SITE_OTHER): Payer: Medicaid Other | Admitting: Student

## 2022-05-10 ENCOUNTER — Ambulatory Visit: Payer: Medicaid Other | Admitting: Student

## 2022-05-10 ENCOUNTER — Encounter: Payer: Self-pay | Admitting: Student

## 2022-05-10 VITALS — BP 118/58 | HR 83 | Temp 98.2°F | Ht 73.5 in | Wt 367.0 lb

## 2022-05-10 DIAGNOSIS — Z87891 Personal history of nicotine dependence: Secondary | ICD-10-CM

## 2022-05-10 DIAGNOSIS — U071 COVID-19: Secondary | ICD-10-CM | POA: Diagnosis not present

## 2022-05-10 DIAGNOSIS — J9612 Chronic respiratory failure with hypercapnia: Secondary | ICD-10-CM | POA: Diagnosis not present

## 2022-05-10 LAB — PULMONARY FUNCTION TEST
DL/VA % pred: 101 %
DL/VA: 4.17 ml/min/mmHg/L
DLCO cor % pred: 62 %
DLCO cor: 18.8 ml/min/mmHg
DLCO unc % pred: 62 %
DLCO unc: 18.8 ml/min/mmHg
FEF 25-75 Post: 2.85 L/sec
FEF 25-75 Pre: 2.09 L/sec
FEF2575-%Change-Post: 36 %
FEF2575-%Pred-Post: 91 %
FEF2575-%Pred-Pre: 67 %
FEV1-%Change-Post: 5 %
FEV1-%Pred-Post: 74 %
FEV1-%Pred-Pre: 70 %
FEV1-Post: 2.59 L
FEV1-Pre: 2.44 L
FEV1FVC-%Change-Post: 2 %
FEV1FVC-%Pred-Pre: 100 %
FEV6-%Change-Post: 2 %
FEV6-%Pred-Post: 74 %
FEV6-%Pred-Pre: 72 %
FEV6-Post: 3.23 L
FEV6-Pre: 3.14 L
FEV6FVC-%Pred-Post: 103 %
FEV6FVC-%Pred-Pre: 103 %
FVC-%Change-Post: 2 %
FVC-%Pred-Post: 71 %
FVC-%Pred-Pre: 69 %
FVC-Post: 3.23 L
FVC-Pre: 3.14 L
Post FEV1/FVC ratio: 80 %
Post FEV6/FVC ratio: 100 %
Pre FEV1/FVC ratio: 78 %
Pre FEV6/FVC Ratio: 100 %
RV % pred: 84 %
RV: 2.11 L
TLC % pred: 68 %
TLC: 5.25 L

## 2022-05-10 LAB — BASIC METABOLIC PANEL
BUN: 15 mg/dL (ref 6–23)
CO2: 31 mEq/L (ref 19–32)
Calcium: 9.5 mg/dL (ref 8.4–10.5)
Chloride: 103 mEq/L (ref 96–112)
Creatinine, Ser: 1.04 mg/dL (ref 0.40–1.50)
GFR: 76.28 mL/min (ref >60.00)
Glucose, Bld: 99 mg/dL (ref 70–99)
Potassium: 4 mEq/L (ref 3.5–5.1)
Sodium: 141 mEq/L (ref 135–145)

## 2022-05-10 MED ORDER — TRELEGY ELLIPTA 100-62.5-25 MCG/ACT IN AEPB: 1.0000 | INHALATION_SPRAY | Freq: Every day | RESPIRATORY_TRACT | 0 refills | Status: DC

## 2022-05-10 NOTE — Progress Notes (Signed)
PFT done today. 

## 2022-05-10 NOTE — Patient Instructions (Addendum)
-   We will request a download from [employer] and labs today - stay on trelegy 1 puff once daily rinse mouth after use - pulmonary rehab referral placed today for prior covid-19 infection - try to have your PAP device ready at your couch or bed whenever you might fall asleep watching TV - trim beard/moustache to improve mask seal, can also try mask liner or ask [employer] about under the nose full face mask. - see you in 6 weeks!

## 2022-06-15 IMAGING — CT CT ANGIO CHEST
2 of 6 series · 17 of 36 positions shown · IV contrast (agent unspecified)
Comparison: Portable chest yesterday, portable chest 02/23/2022,
CTA chest 12/22/2019

CLINICAL DATA: Positive D-dimer, suspected pulmonary embolism.

EXAM:
CT ANGIOGRAPHY CHEST WITH CONTRAST
TECHNIQUE: Multidetector CT imaging of the chest was performed using the
standard protocol during bolus administration of intravenous
contrast. Multiplanar CT image reconstructions and MIPs were
obtained to evaluate the vascular anatomy.

[Series 7: pe thins · axial · 0.98mm/px · z∈[-370,-89]mm · 16 of 211 slices shown]
[im 12/211  lung]
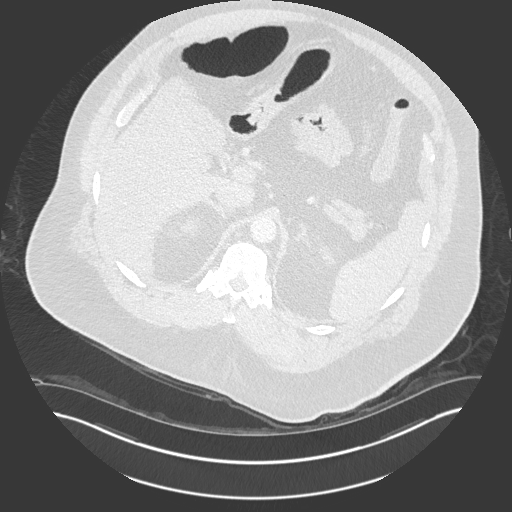
[im 24/211  mediastinal]
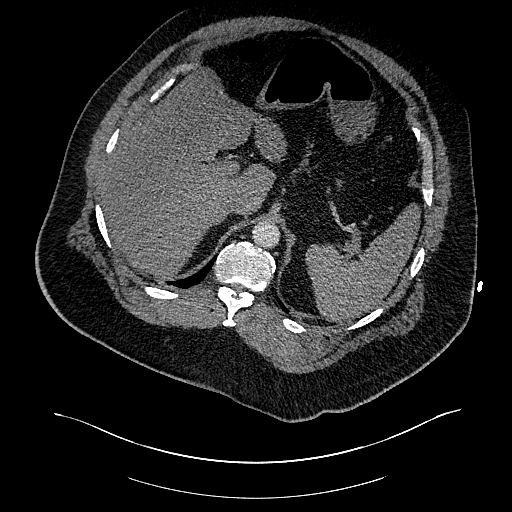
[im 36/211  lung]
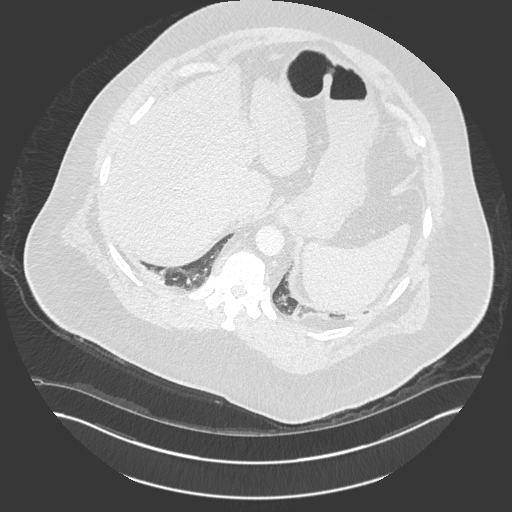
[im 47/211  mediastinal]
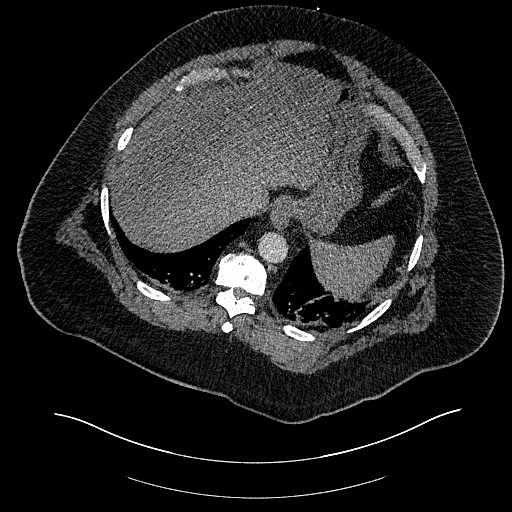
[im 59/211  lung]
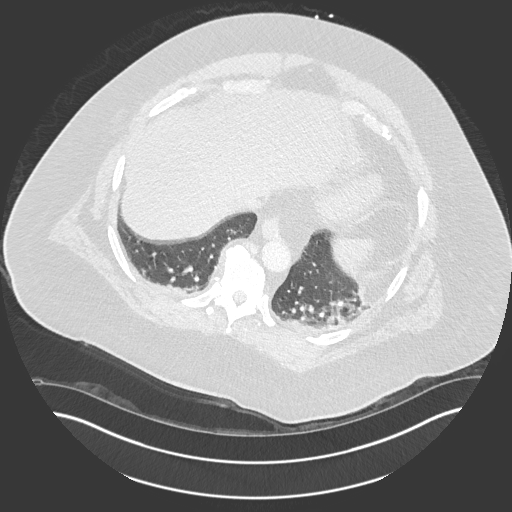
[im 71/211  mediastinal]
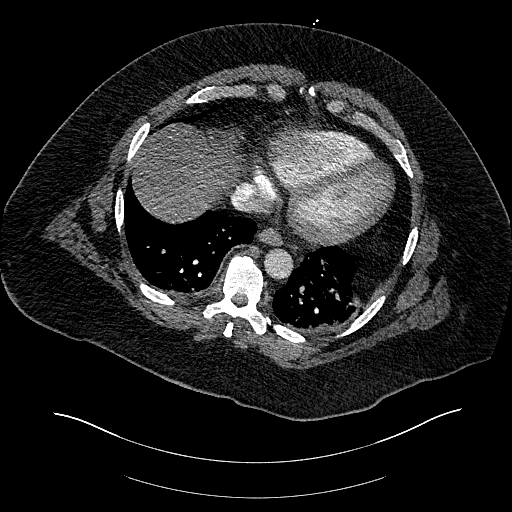
[im 82/211  lung]
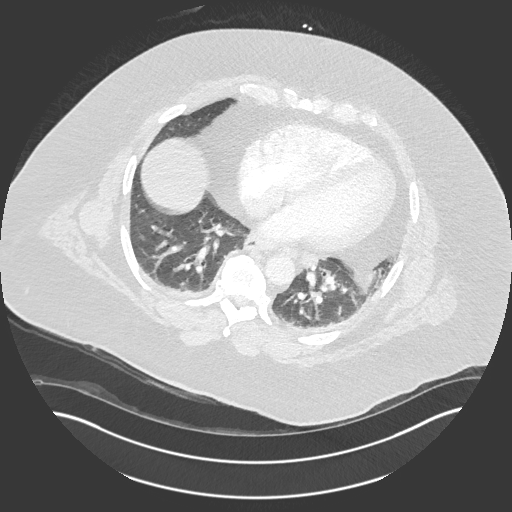
[im 94/211  mediastinal]
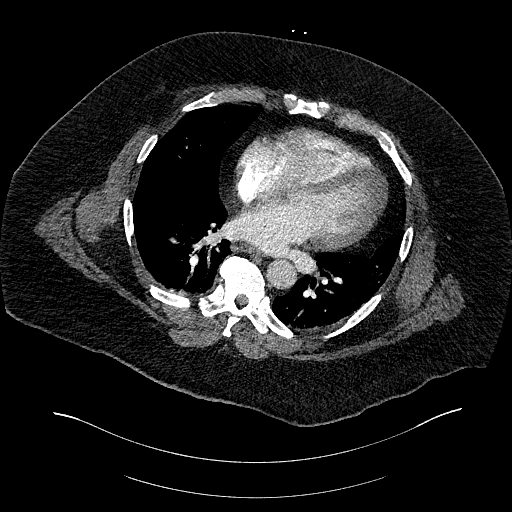
[im 117/211  lung]
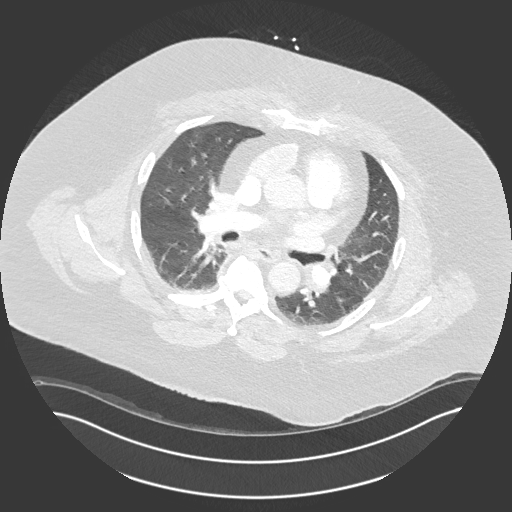
[im 129/211  mediastinal]
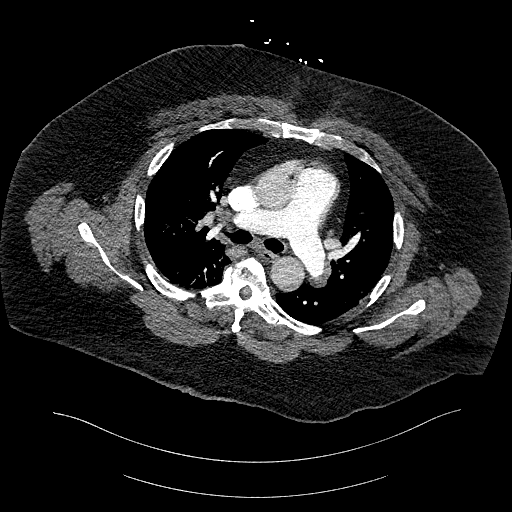
[im 141/211  lung]
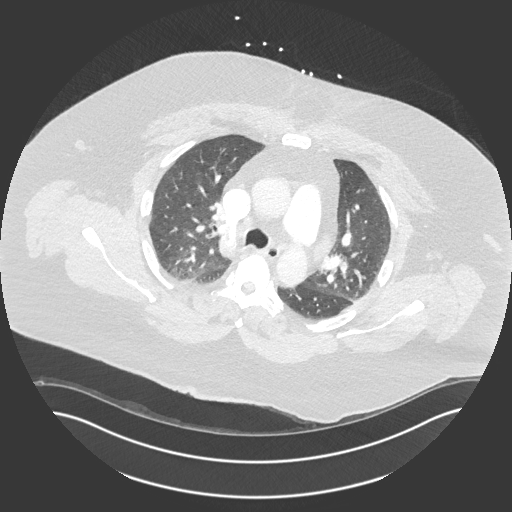
[im 152/211  mediastinal]
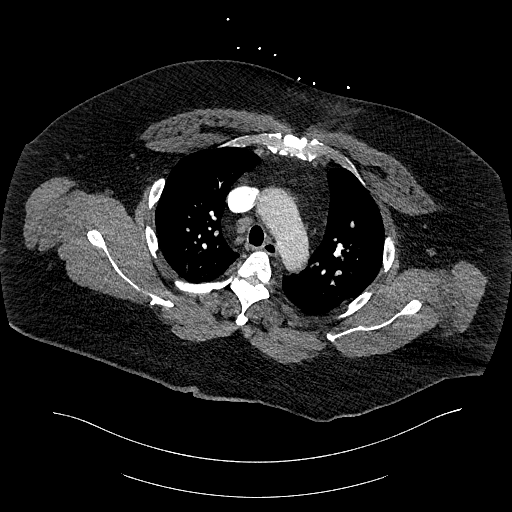
[im 164/211  lung]
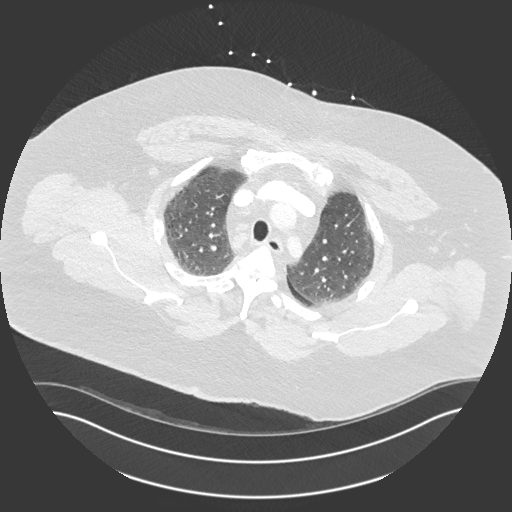
[im 176/211  mediastinal]
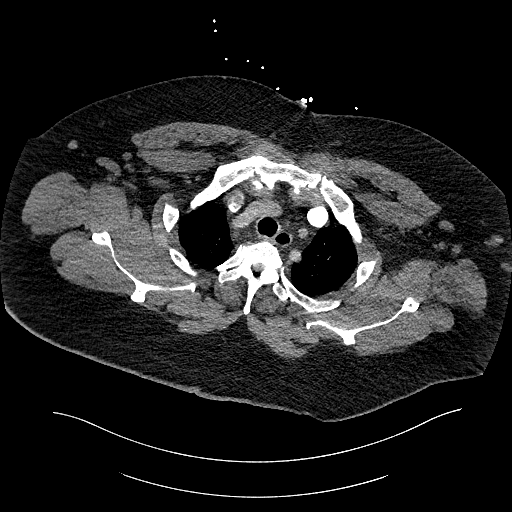
[im 187/211  lung]
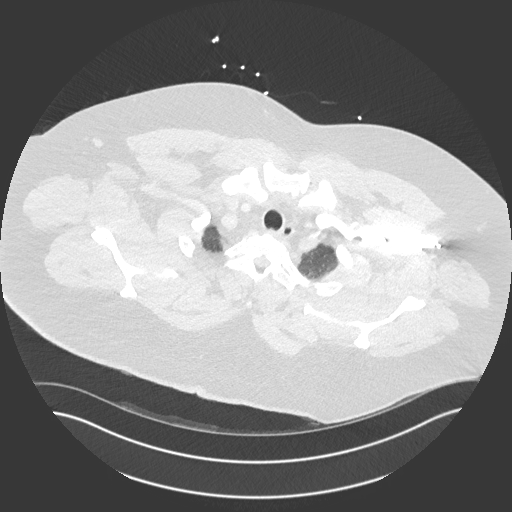
[im 199/211  mediastinal]
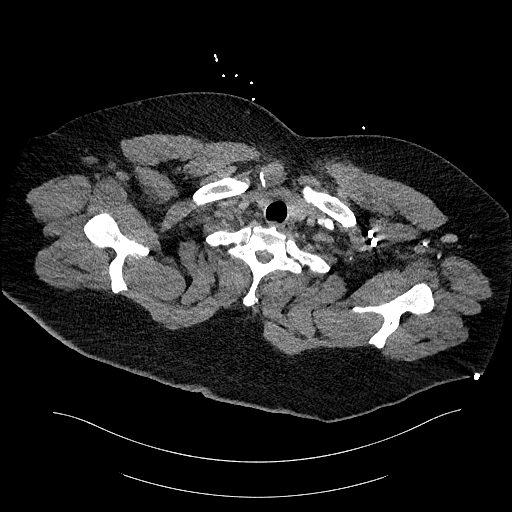

[Series 8: pe 2mm cor · coronal · 0.62mm/px · 1 of 133 slices shown]
[im 67/133  mediastinal]
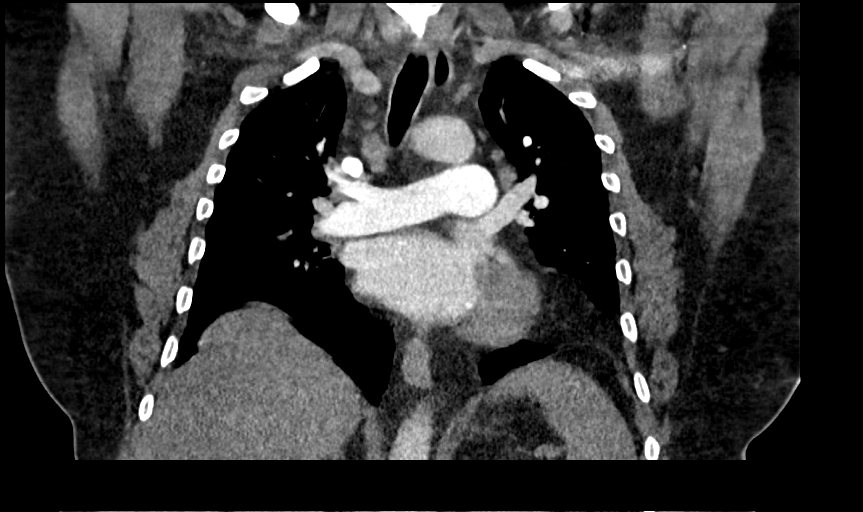

[17 of 36 positions shown; findings below may reference images not displayed]

RADIATION DOSE REDUCTION: This exam was performed according to the
departmental dose-optimization program which includes automated
exposure control, adjustment of the mA and/or kV according to
patient size and/or use of iterative reconstruction technique.

CONTRAST:  65mL OMNIPAQUE IOHEXOL 350 MG/ML SOLN
FINDINGS: Cardiovascular: The pulmonary trunk is mildly prominent at 3.4 cm
consistent with arterial hypertension. Previous measurement also
cm. No arterial embolus is seen through the segmental divisions but
due to bolus timing the subsegmental arterial bed is unopacified and
not evaluated.

There is mild cardiomegaly without significant venous distention and
no pericardial fluid is seen.

There is no visible coronary artery calcification. Evaluation of the
aorta demonstrates a borderline aneurysmal aortic root measuring
cm, ectatic ascending segment 3.7 cm, and the remainder within
normal caliber limits. No aortic dissection or penetrating ulcer.

Great vessels opacify normally without significant plaques.

Mediastinum/Nodes: There are mildly prominent right paratracheal
space nodes up to 1.2 cm in short axis, subcarinal lymph nodes up to
1.3 cm in short axis to the right, and slight prominence of a few
left and right hilar lymph nodes up to 1.1 cm in short axis. These
lymph nodes were larger on the previous study. Active clinical
significance is questionable.

Lungs/Pleura: There are bilateral symmetric trace pleural effusions.
There previously were multifocal patchy as well as confluent
ground-glass opacities which are no longer seen.

There is posterior atelectasis in the lower lobes. Central bronchial
thickening in the lower lobes is noted.

Early centrilobular and paraseptal emphysematous changes in the lung
apices are present. No infiltrate or nodule is seen.

Upper Abdomen: No acute abnormality.  Moderate hepatic steatosis.

Musculoskeletal: There are bridging osteophytes of the thoracic
spine of OSCAR NEY MORELLI eccentric to the right anterolaterally. The ribcage
is intact. No focal bone lesion is seen

Review of the MIP images confirms the above findings.
IMPRESSION: 1. Mildly prominent pulmonary trunk. No arterial embolus is seen
through the segmental divisions. The subsegmental arteries are
unopacified and not evaluated.
2. Mild cardiomegaly without venous distention or edema. Trace
pleural effusions.
3. Trace aortic atherosclerosis, borderline aneurysmal aortic root
and mildly ectatic ascending segment. Follow-up as indicated.
4. Mildly enlarged mediastinal and hilar nodes but they are less
enlarged than previously suggesting the previous nodal enlargement
was probably due to reactive hyperplasia. Follow-up chest CT in 12
months may be considered to ensure stability.
5. Evidence for lower lobe bronchitis with early emphysematous
change in the apices.
6. Fatty liver.

## 2022-06-22 ENCOUNTER — Telehealth: Payer: Self-pay | Admitting: Student

## 2022-06-22 NOTE — Telephone Encounter (Signed)
Had planned on seeing him at end of august or beginning of September. Not sure that he needs to come back this soon. Happy to see him if he wishes to keep this appointment but otherwise can we reschedule for end of august/sept?  Thanks!

## 2022-06-22 NOTE — Telephone Encounter (Signed)
I called and spoke with the pt  He states prefers to keep the appt that he is scheduled for on Monday 06/25/22 to discuss meds  Nothing further needed
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
## 2022-06-25 ENCOUNTER — Encounter: Payer: Self-pay | Admitting: Student

## 2022-06-25 ENCOUNTER — Ambulatory Visit: Payer: Medicaid Other | Admitting: Student

## 2022-06-25 VITALS — BP 138/76 | HR 82 | Temp 98.7°F | Ht 73.0 in | Wt 372.0 lb

## 2022-06-25 DIAGNOSIS — G4733 Obstructive sleep apnea (adult) (pediatric): Secondary | ICD-10-CM

## 2022-06-25 DIAGNOSIS — J432 Centrilobular emphysema: Secondary | ICD-10-CM

## 2022-06-25 DIAGNOSIS — U071 COVID-19: Secondary | ICD-10-CM

## 2022-06-25 MED ORDER — STIOLTO RESPIMAT 2.5-2.5 MCG/ACT IN AERS: 2.0000 | INHALATION_SPRAY | Freq: Every day | RESPIRATORY_TRACT | 0 refills | Status: DC

## 2022-06-25 MED ORDER — NYSTATIN 100000 UNIT/ML MT SUSP: 5.0000 mL | Freq: Four times a day (QID) | OROMUCOSAL | 0 refills | Status: AC

## 2022-06-25 MED ORDER — STIOLTO RESPIMAT 2.5-2.5 MCG/ACT IN AERS
2.0000 | INHALATION_SPRAY | Freq: Every day | RESPIRATORY_TRACT | 11 refills | Status: DC
Start: 2022-06-25 — End: 2022-09-27

## 2022-06-25 NOTE — Patient Instructions (Addendum)
-   We will request a download from [facility] - nystatin swish/swallow until tooth pain cleared up, 2 weeks at most. See dentist if not improving - start stiolto 2 puffs daily - let's hold off on trelegy for now - pulmonary rehab referral placed today for prior covid-19 infection - try to have your PAP device ready at your couch or bed whenever you might fall asleep watching TV - can trim beard/moustache to improve mask seal, can also try mask liner or ask [employer] about under the nose full face mask. - see you in 3 months!

## 2022-06-28 ENCOUNTER — Telehealth (HOSPITAL_COMMUNITY): Payer: Self-pay

## 2022-06-28 NOTE — Telephone Encounter (Signed)
Called pt and advised him that his insurance will not cover his pulmonary rehab for his diagnosis. I advised pt to call and see if he is able to do anything to see if they will cover it. Pt understood. Closed referral.

## 2022-07-24 ENCOUNTER — Other Ambulatory Visit: Payer: Self-pay

## 2022-07-24 DIAGNOSIS — Z122 Encounter for screening for malignant neoplasm of respiratory organs: Secondary | ICD-10-CM

## 2022-07-24 DIAGNOSIS — Z87891 Personal history of nicotine dependence: Secondary | ICD-10-CM

## 2022-08-08 ENCOUNTER — Encounter: Payer: Self-pay | Admitting: Acute Care

## 2022-08-08 ENCOUNTER — Ambulatory Visit (INDEPENDENT_AMBULATORY_CARE_PROVIDER_SITE_OTHER): Payer: Medicaid Other | Admitting: Acute Care

## 2022-08-08 DIAGNOSIS — Z87891 Personal history of nicotine dependence: Secondary | ICD-10-CM

## 2022-08-08 NOTE — Patient Instructions (Signed)
Thank you for participating in the [facility]. It was our pleasure to meet you today. We will call you with the results of your scan within the next few days. Your scan will be assigned a Lung RADS category score by the physicians reading the scans.  This Lung RADS score determines follow up scanning.  See below for description of categories, and follow up screening recommendations. We will be in touch to schedule your follow up screening annually or based on recommendations of our providers. We will fax a copy of your scan results to your Primary Care Physician, or the physician who referred you to the program, to ensure they have the results. Please call the office if you have any questions or concerns regarding your scanning experience or results.  Our office number is [phone]. Please speak with Denise Phelps, RN. , or  Denise Buckner RN, They are  our Lung Cancer Screening RN.'s If They are unavailable when you call, Please leave a message on the voice mail. We will return your call at our earliest convenience.This voice mail is monitored several times a day.  Remember, if your scan is normal, we will scan you annually as long as you continue to meet the criteria for the program. (Age 55-77, Current smoker or smoker who has quit within the last 15 years). If you are a smoker, remember, quitting is the single most powerful action that you can take to decrease your risk of lung cancer and other pulmonary, breathing related problems. We know quitting is hard, and we are here to help.  Please let us know if there is anything we can do to help you meet your goal of quitting. If you are a former smoker, congratulations. We are proud of you! Remain smoke free! Remember you can refer friends or family members through the number above.  We will screen them to make sure they meet criteria for the program. Thank you for helping us take better care of you by  participating in Lung Screening.  You can receive free nicotine replacement therapy ( patches, gum or mints) by calling 1-800-QUIT NOW. Please call so we can get you on the path to becoming  a non-smoker. I know it is hard, but you can do this!  Lung RADS Categories:  Lung RADS 1: no nodules or definitely non-concerning nodules.  Recommendation is for a repeat annual scan in 12 months.  Lung RADS 2:  nodules that are non-concerning in appearance and behavior with a very low likelihood of becoming an active cancer. Recommendation is for a repeat annual scan in 12 months.  Lung RADS 3: nodules that are probably non-concerning , includes nodules with a low likelihood of becoming an active cancer.  Recommendation is for a 6-month repeat screening scan. Often noted after an upper respiratory illness. We will be in touch to make sure you have no questions, and to schedule your 6-month scan.  Lung RADS 4 A: nodules with concerning findings, recommendation is most often for a follow up scan in 3 months or additional testing based on our provider's assessment of the scan. We will be in touch to make sure you have no questions and to schedule the recommended 3 month follow up scan.  Lung RADS 4 B:  indicates findings that are concerning. We will be in touch with you to schedule additional diagnostic testing based on our provider's  assessment of the scan.  Other options for assistance in smoking cessation (   As covered by your insurance benefits)  Hypnosis for smoking cessation  [employer] [phone]  Acupuncture for smoking cessation  [facility] [phone]   

## 2022-08-08 NOTE — Progress Notes (Signed)
Virtual Visit via Telephone Note  I connected with Kenneth Davila on 08/08/22 at 10:30 AM EDT by telephone and verified that I am speaking with the correct person using two identifiers.  Location: Patient:  At home Provider:  [address], [place], [place], [address]    I discussed the limitations, risks, security and privacy concerns of performing an evaluation and management service by telephone and the availability of in person appointments. I also discussed with the patient that there may be a patient responsible charge related to this service. The patient expressed understanding and agreed to proceed.    Shared Decision Making Visit Lung Cancer Screening Program [phone])   Eligibility: Age 66 y.o. Pack Years Smoking History Calculation 44 pack year smoking history (# packs/per year x # years smoked) Recent History of coughing up blood  no Unexplained weight loss? no ( >Than 15 pounds within the last 6 months ) Prior History Lung / other cancer no (Diagnosis within the last 5 years already requiring surveillance chest CT Scans). Smoking Status Former Smoker Former Smokers: Years since quit: 3 years  Quit Date: 08/2018  Visit Components: Discussion included one or more decision making aids. yes Discussion included risk/benefits of screening. yes Discussion included potential follow up diagnostic testing for abnormal scans. yes Discussion included meaning and risk of over diagnosis. yes Discussion included meaning and risk of False Positives. yes Discussion included meaning of total radiation exposure. yes  Counseling Included: Importance of adherence to annual lung cancer LDCT screening. yes Impact of comorbidities on ability to participate in the program. yes Ability and willingness to under diagnostic treatment. yes  Smoking Cessation Counseling: Current Smokers:  Discussed importance of smoking cessation. yes Information about tobacco cessation classes and  interventions provided to patient. yes Patient provided with "ticket" for LDCT Scan. yes Symptomatic Patient. no  Counseling NA Diagnosis Code: Tobacco Use Z72.0 Asymptomatic Patient yes  Counseling (Intermediate counseling: > three minutes counseling) [id number] Former Smokers:  Discussed the importance of maintaining cigarette abstinence. yes Diagnosis Code: Personal History of Nicotine Dependence. X10.626 Information about tobacco cessation classes and interventions provided to patient. Yes Patient provided with "ticket" for LDCT Scan. yes Written Order for Lung Cancer Screening with LDCT placed in Epic. Yes (CT Chest Lung Cancer Screening Low Dose W/O CM) [id number] Z12.2-Screening of respiratory organs Z87.891-Personal history of nicotine dependence  I spent 25 minutes of face to face time/virtual visit time  with  Kenneth Davila discussing the risks and benefits of lung cancer screening. We took the time to pause the power point at intervals to allow for questions to be asked and answered to ensure understanding. We discussed that he had taken the single most powerful action possible to decrease his risk of developing lung cancer when he quit smoking. I counseled him to remain smoke free, and to contact me if he ever had the desire to smoke again so that I can provide resources and tools to help support the effort to remain smoke free. We discussed the time and location of the scan, and that either  Doroteo Glassman RN, Joella Prince, RN or I  or I will call / send a letter with the results within  24-72 hours of receiving them. He has the office contact information in the event he needs to speak with me,  he verbalized understanding of all of the above and had no further questions upon leaving the office.     I explained to the patient that there  has been a high incidence of coronary artery disease noted on these exams. I explained that this is a non-gated exam therefore degree or severity cannot be  determined. This patient is not on statin therapy. I have asked the patient to follow-up with their PCP regarding any incidental finding of coronary artery disease and management with diet or medication as they feel is clinically indicated. The patient verbalized understanding of the above and had no further questions.     Magdalen Spatz, NP 08/08/2022

## 2022-08-13 ENCOUNTER — Ambulatory Visit
Admission: RE | Admit: 2022-08-13 | Discharge: 2022-08-13 | Disposition: A | Discharge status: Home / Self Care | Payer: Medicaid Other | Source: Ambulatory Visit | Attending: Acute Care | Admitting: Acute Care

## 2022-08-13 DIAGNOSIS — Z122 Encounter for screening for malignant neoplasm of respiratory organs: Secondary | ICD-10-CM

## 2022-08-13 DIAGNOSIS — Z87891 Personal history of nicotine dependence: Secondary | ICD-10-CM

## 2022-08-14 ENCOUNTER — Other Ambulatory Visit: Payer: Self-pay

## 2022-08-14 DIAGNOSIS — Z122 Encounter for screening for malignant neoplasm of respiratory organs: Secondary | ICD-10-CM

## 2022-08-14 DIAGNOSIS — Z87891 Personal history of nicotine dependence: Secondary | ICD-10-CM

## 2022-09-25 ENCOUNTER — Ambulatory Visit: Payer: Medicaid Other | Admitting: Student
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
## 2022-09-27 ENCOUNTER — Ambulatory Visit (INDEPENDENT_AMBULATORY_CARE_PROVIDER_SITE_OTHER): Payer: Medicaid Other | Admitting: Student

## 2022-09-27 ENCOUNTER — Encounter: Payer: Self-pay | Admitting: Student

## 2022-09-27 VITALS — BP 122/72 | HR 73 | Temp 98.3°F | Ht 74.0 in | Wt 362.2 lb

## 2022-09-27 DIAGNOSIS — J432 Centrilobular emphysema: Secondary | ICD-10-CM | POA: Diagnosis not present

## 2022-09-27 MED ORDER — COMBIVENT RESPIMAT 20-100 MCG/ACT IN AERS: 1.0000 | INHALATION_SPRAY | Freq: Four times a day (QID) | RESPIRATORY_TRACT | 11 refills | Status: DC | PRN

## 2022-09-27 NOTE — Patient Instructions (Addendum)
-   We will request a download from [facility] - start combivent 1 puff at least 2-3 times per day, max 6 puffs daily  - try to have your PAP device ready at your couch or bed whenever you might fall asleep watching TV - see you in 6 months or sooner if needed!

## 2022-12-03 ENCOUNTER — Other Ambulatory Visit: Payer: Self-pay

## 2022-12-03 ENCOUNTER — Ambulatory Visit: Payer: Medicaid Other | Attending: Neurological Surgery

## 2022-12-03 DIAGNOSIS — G8929 Other chronic pain: Secondary | ICD-10-CM | POA: Diagnosis present

## 2022-12-03 DIAGNOSIS — M79605 Pain in left leg: Secondary | ICD-10-CM | POA: Insufficient documentation

## 2022-12-03 DIAGNOSIS — M5442 Lumbago with sciatica, left side: Secondary | ICD-10-CM | POA: Diagnosis present

## 2022-12-03 DIAGNOSIS — M5441 Lumbago with sciatica, right side: Secondary | ICD-10-CM | POA: Insufficient documentation

## 2022-12-03 DIAGNOSIS — M6281 Muscle weakness (generalized): Secondary | ICD-10-CM | POA: Diagnosis present

## 2022-12-03 DIAGNOSIS — R2689 Other abnormalities of gait and mobility: Secondary | ICD-10-CM | POA: Insufficient documentation
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
## 2022-12-11 ENCOUNTER — Encounter: Payer: Self-pay | Admitting: Podiatry

## 2022-12-11 ENCOUNTER — Ambulatory Visit: Payer: Medicaid Other | Admitting: Podiatry

## 2022-12-11 DIAGNOSIS — B351 Tinea unguium: Secondary | ICD-10-CM

## 2022-12-11 DIAGNOSIS — G6289 Other specified polyneuropathies: Secondary | ICD-10-CM

## 2022-12-11 DIAGNOSIS — M79674 Pain in right toe(s): Secondary | ICD-10-CM

## 2022-12-11 DIAGNOSIS — M79675 Pain in left toe(s): Secondary | ICD-10-CM

## 2022-12-11 NOTE — Progress Notes (Signed)
  Subjective:  Patient ID: Kenneth Davila, male    DOB: 1956-12-06,  MRN: [id number]  Chief Complaint  Patient presents with   Nail Problem    NP  thick painful toenails    66 y.o. male presents with the above complaint. History confirmed with patient.  He complains of burning tingling shooting pain in the tips of his toes and numbness as well as thickened elongated painful toenails  Objective:  Physical Exam: warm, good capillary refill, no trophic changes or ulcerative lesions, normal DP and PT pulses, and abnormal sensory exam with LOPS. Left Foot: dystrophic yellowed discolored nail plates with subungual debris Right Foot: dystrophic yellowed discolored nail plates with subungual debris  Assessment:   1. Pain due to onychomycosis of toenails of both feet   2. Other polyneuropathy      Plan:  Patient was evaluated and treated and all questions answered.  Discussed the etiology and treatment options for the condition in detail with the patient. Educated patient on the topical and oral treatment options for mycotic nails. Recommended debridement of the nails today. Sharp and mechanical debridement performed of all painful and mycotic nails today. Nails debrided in length and thickness using a nail nipper to level of comfort. Discussed treatment options including appropriate shoe gear. Follow up as needed for painful nails.  Rx to check his LFTs was given him today.  We discussed use of terbinafine.  If his LFTs are normal we should be able to proceed with this with close monitoring  Regarding his peripheral neuropathy we discussed possible etiologies of these he has multiple histories of back surgery.  Discussed that there may not be a possible cure or solution to his neuropathy other than symptomatic treatment.  He has currently done this with Lyrica and gabapentin.  He will follow-up with [place] as needed in this regard     Return in about 3 months (around 03/12/2023) for RFC.

## 2022-12-12 ENCOUNTER — Ambulatory Visit: Payer: Medicaid Other

## 2022-12-12 DIAGNOSIS — M79605 Pain in left leg: Secondary | ICD-10-CM

## 2022-12-12 DIAGNOSIS — G8929 Other chronic pain: Secondary | ICD-10-CM

## 2022-12-12 DIAGNOSIS — R2689 Other abnormalities of gait and mobility: Secondary | ICD-10-CM

## 2022-12-12 DIAGNOSIS — M6281 Muscle weakness (generalized): Secondary | ICD-10-CM

## 2022-12-12 LAB — HEPATIC FUNCTION PANEL
ALT: 27 IU/L (ref 0–44)
AST: 28 IU/L (ref 0–40)
Albumin: 4.8 g/dL (ref 3.9–4.9)
Alkaline Phosphatase: 92 IU/L (ref 44–121)
Bilirubin Total: 0.3 mg/dL (ref 0.0–1.2)
Bilirubin, Direct: 0.1 mg/dL (ref 0.00–0.40)
Total Protein: 7.1 g/dL (ref 6.0–8.5)
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
## 2022-12-13 ENCOUNTER — Ambulatory Visit: Payer: Medicaid Other

## 2022-12-13 DIAGNOSIS — G8929 Other chronic pain: Secondary | ICD-10-CM

## 2022-12-13 DIAGNOSIS — M79605 Pain in left leg: Secondary | ICD-10-CM | POA: Diagnosis not present

## 2022-12-13 DIAGNOSIS — M6281 Muscle weakness (generalized): Secondary | ICD-10-CM
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
## 2022-12-18 ENCOUNTER — Ambulatory Visit: Payer: Medicaid Other | Attending: Neurological Surgery

## 2022-12-18 DIAGNOSIS — G8929 Other chronic pain: Secondary | ICD-10-CM | POA: Insufficient documentation

## 2022-12-18 DIAGNOSIS — M5442 Lumbago with sciatica, left side: Secondary | ICD-10-CM | POA: Insufficient documentation

## 2022-12-18 DIAGNOSIS — M5441 Lumbago with sciatica, right side: Secondary | ICD-10-CM | POA: Diagnosis present

## 2022-12-18 DIAGNOSIS — M6281 Muscle weakness (generalized): Secondary | ICD-10-CM | POA: Insufficient documentation

## 2022-12-18 DIAGNOSIS — M79605 Pain in left leg: Secondary | ICD-10-CM | POA: Diagnosis not present

## 2022-12-18 DIAGNOSIS — R2689 Other abnormalities of gait and mobility: Secondary | ICD-10-CM | POA: Diagnosis present

## 2022-12-18 MED ORDER — TERBINAFINE HCL 250 MG PO TABS: 250.0000 mg | ORAL_TABLET | Freq: Every day | ORAL | 0 refills | Status: AC
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
## 2022-12-18 NOTE — Addendum Note (Signed)
Addended bySherryle Lis, Jadian Karman R on: 12/18/2022 08:04 AM   Modules accepted: Orders

## 2022-12-19 ENCOUNTER — Ambulatory Visit: Payer: Medicaid Other

## 2022-12-19 DIAGNOSIS — R2689 Other abnormalities of gait and mobility: Secondary | ICD-10-CM

## 2022-12-19 DIAGNOSIS — M79605 Pain in left leg: Secondary | ICD-10-CM | POA: Diagnosis not present

## 2022-12-19 DIAGNOSIS — M6281 Muscle weakness (generalized): Secondary | ICD-10-CM

## 2022-12-19 DIAGNOSIS — G8929 Other chronic pain: Secondary | ICD-10-CM

## 2022-12-24 ENCOUNTER — Ambulatory Visit: Payer: Medicaid Other

## 2022-12-24 DIAGNOSIS — G8929 Other chronic pain: Secondary | ICD-10-CM

## 2022-12-24 DIAGNOSIS — M79605 Pain in left leg: Secondary | ICD-10-CM | POA: Diagnosis not present

## 2022-12-24 DIAGNOSIS — M6281 Muscle weakness (generalized): Secondary | ICD-10-CM
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
## 2022-12-26 ENCOUNTER — Ambulatory Visit: Payer: Medicaid Other

## 2022-12-26 DIAGNOSIS — M6281 Muscle weakness (generalized): Secondary | ICD-10-CM

## 2022-12-26 DIAGNOSIS — G8929 Other chronic pain: Secondary | ICD-10-CM

## 2022-12-26 DIAGNOSIS — M79605 Pain in left leg: Secondary | ICD-10-CM | POA: Diagnosis not present

## 2022-12-26 DIAGNOSIS — R2689 Other abnormalities of gait and mobility: Secondary | ICD-10-CM
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
## 2022-12-31 ENCOUNTER — Ambulatory Visit: Payer: Medicaid Other

## 2022-12-31 DIAGNOSIS — M79605 Pain in left leg: Secondary | ICD-10-CM

## 2022-12-31 DIAGNOSIS — G8929 Other chronic pain: Secondary | ICD-10-CM

## 2022-12-31 DIAGNOSIS — M6281 Muscle weakness (generalized): Secondary | ICD-10-CM
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
## 2023-01-02 ENCOUNTER — Ambulatory Visit: Payer: Medicaid Other

## 2023-01-02 DIAGNOSIS — M79605 Pain in left leg: Secondary | ICD-10-CM | POA: Diagnosis not present

## 2023-01-02 DIAGNOSIS — G8929 Other chronic pain: Secondary | ICD-10-CM

## 2023-01-02 DIAGNOSIS — M6281 Muscle weakness (generalized): Secondary | ICD-10-CM
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
## 2023-01-07 ENCOUNTER — Ambulatory Visit: Payer: Medicaid Other

## 2023-01-07 DIAGNOSIS — G8929 Other chronic pain: Secondary | ICD-10-CM

## 2023-01-07 DIAGNOSIS — M79605 Pain in left leg: Secondary | ICD-10-CM | POA: Diagnosis not present
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
## 2023-01-09 ENCOUNTER — Ambulatory Visit: Payer: Medicaid Other

## 2023-01-09 DIAGNOSIS — G8929 Other chronic pain: Secondary | ICD-10-CM

## 2023-01-09 DIAGNOSIS — M6281 Muscle weakness (generalized): Secondary | ICD-10-CM

## 2023-01-09 DIAGNOSIS — M79605 Pain in left leg: Secondary | ICD-10-CM
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
## 2023-01-14 ENCOUNTER — Ambulatory Visit: Payer: Medicaid Other

## 2023-01-14 DIAGNOSIS — G8929 Other chronic pain: Secondary | ICD-10-CM

## 2023-01-14 DIAGNOSIS — M79605 Pain in left leg: Secondary | ICD-10-CM | POA: Diagnosis not present
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
## 2023-01-16 ENCOUNTER — Ambulatory Visit: Payer: Medicaid Other

## 2023-01-16 DIAGNOSIS — M79605 Pain in left leg: Secondary | ICD-10-CM | POA: Diagnosis not present

## 2023-01-16 DIAGNOSIS — M6281 Muscle weakness (generalized): Secondary | ICD-10-CM

## 2023-01-16 DIAGNOSIS — G8929 Other chronic pain: Secondary | ICD-10-CM
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
## 2023-01-21 ENCOUNTER — Ambulatory Visit: Payer: Medicaid Other | Attending: Neurological Surgery

## 2023-01-21 DIAGNOSIS — M5441 Lumbago with sciatica, right side: Secondary | ICD-10-CM | POA: Insufficient documentation

## 2023-01-21 DIAGNOSIS — M79605 Pain in left leg: Secondary | ICD-10-CM | POA: Insufficient documentation

## 2023-01-21 DIAGNOSIS — M5442 Lumbago with sciatica, left side: Secondary | ICD-10-CM | POA: Insufficient documentation

## 2023-01-21 DIAGNOSIS — M6281 Muscle weakness (generalized): Secondary | ICD-10-CM | POA: Diagnosis present

## 2023-01-21 DIAGNOSIS — G8929 Other chronic pain: Secondary | ICD-10-CM | POA: Diagnosis present
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
## 2023-01-23 ENCOUNTER — Ambulatory Visit: Payer: Medicaid Other

## 2023-01-23 DIAGNOSIS — G8929 Other chronic pain: Secondary | ICD-10-CM

## 2023-01-23 DIAGNOSIS — M79605 Pain in left leg: Secondary | ICD-10-CM | POA: Diagnosis not present

## 2023-01-28 ENCOUNTER — Ambulatory Visit: Payer: Medicaid Other

## 2023-01-28 DIAGNOSIS — M6281 Muscle weakness (generalized): Secondary | ICD-10-CM

## 2023-01-28 DIAGNOSIS — G8929 Other chronic pain: Secondary | ICD-10-CM

## 2023-01-28 DIAGNOSIS — M79605 Pain in left leg: Secondary | ICD-10-CM | POA: Diagnosis not present
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
## 2023-01-30 ENCOUNTER — Ambulatory Visit: Payer: Medicaid Other

## 2023-01-30 DIAGNOSIS — G8929 Other chronic pain: Secondary | ICD-10-CM

## 2023-01-30 DIAGNOSIS — M79605 Pain in left leg: Secondary | ICD-10-CM | POA: Diagnosis not present

## 2023-01-30 DIAGNOSIS — M6281 Muscle weakness (generalized): Secondary | ICD-10-CM
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
## 2023-02-12 ENCOUNTER — Other Ambulatory Visit: Payer: Self-pay | Admitting: Internal Medicine

## 2023-02-13 LAB — LIPID PANEL
Cholesterol: 199 mg/dL (ref <200)
HDL: 44 mg/dL (ref >=40)
LDL Cholesterol (Calc): 126 mg/dL (calc) — ABNORMAL HIGH
Non-HDL Cholesterol (Calc): 155 mg/dL (calc) — ABNORMAL HIGH (ref <130)
Total CHOL/HDL Ratio: 4.5 (calc) (ref <5.0)
Triglycerides: 171 mg/dL — ABNORMAL HIGH (ref <150)

## 2023-02-13 LAB — COMPLETE METABOLIC PANEL WITH GFR
AG Ratio: 1.7 (calc) (ref 1.0–2.5)
ALT: 30 U/L (ref 9–46)
AST: 26 U/L (ref 10–35)
Albumin: 4.6 g/dL (ref 3.6–5.1)
Alkaline phosphatase (APISO): 94 U/L (ref 35–144)
BUN: 12 mg/dL (ref 7–25)
CO2: 26 mmol/L (ref 20–32)
Calcium: 9.6 mg/dL (ref 8.6–10.3)
Chloride: 105 mmol/L (ref 98–110)
Creat: 1.09 mg/dL (ref 0.70–1.35)
Globulin: 2.7 g/dL (calc) (ref 1.9–3.7)
Glucose, Bld: 106 mg/dL — ABNORMAL HIGH (ref 65–99)
Potassium: 4.2 mmol/L (ref 3.5–5.3)
Sodium: 141 mmol/L (ref 135–146)
Total Bilirubin: 0.4 mg/dL (ref 0.2–1.2)
Total Protein: 7.3 g/dL (ref 6.1–8.1)
eGFR: 76 mL/min/{1.73_m2} (ref >=60)

## 2023-02-13 LAB — CBC
HCT: 39 % (ref 38.5–50.0)
Hemoglobin: 12.5 g/dL — ABNORMAL LOW (ref 13.2–17.1)
MCH: 25.7 pg — ABNORMAL LOW (ref 27.0–33.0)
MCHC: 32.1 g/dL (ref 32.0–36.0)
MCV: 80.1 fL (ref 80.0–100.0)
MPV: 11.2 fL (ref 7.5–12.5)
Platelets: 186 10*3/uL (ref 140–400)
RBC: 4.87 10*6/uL (ref 4.20–5.80)
RDW: 13.6 % (ref 11.0–15.0)
WBC: 5.8 10*3/uL (ref 3.8–10.8)

## 2023-02-13 LAB — VITAMIN D 25 HYDROXY (VIT D DEFICIENCY, FRACTURES): Vit D, 25-Hydroxy: 18 ng/mL — ABNORMAL LOW (ref 30–100)

## 2023-02-13 LAB — TSH: TSH: 1.82 mIU/L (ref 0.40–4.50)

## 2023-03-13 ENCOUNTER — Ambulatory Visit: Payer: Medicaid Other | Admitting: Podiatry

## 2023-03-13 ENCOUNTER — Encounter: Payer: Self-pay | Admitting: Podiatry

## 2023-03-13 VITALS — BP 148/83 | HR 83

## 2023-03-13 DIAGNOSIS — M79675 Pain in left toe(s): Secondary | ICD-10-CM | POA: Diagnosis not present

## 2023-03-13 DIAGNOSIS — M79674 Pain in right toe(s): Secondary | ICD-10-CM

## 2023-03-13 DIAGNOSIS — B351 Tinea unguium: Secondary | ICD-10-CM

## 2023-03-13 DIAGNOSIS — R7303 Prediabetes: Secondary | ICD-10-CM

## 2023-03-13 NOTE — Progress Notes (Signed)
This patient returns to my office for at risk foot care.  This patient requires this care by a professional since this patient will be at risk due to having diet controlled diabetes.  This patient is unable to cut nails himself since the patient cannot reach his nails.These nails are painful walking and wearing shoes.  This patient presents for at risk foot care today.  General Appearance  Alert, conversant and in no acute stress.  Vascular  Dorsalis pedis and posterior tibial  pulses are palpable  bilaterally.  Capillary return is within normal limits  bilaterally. Temperature is within normal limits  bilaterally.  Neurologic  Senn-Weinstein monofilament wire test within normal limits  bilaterally. Muscle power within normal limits bilaterally.  Nails Thick disfigured discolored nails with subungual debris  from hallux to fifth toes bilaterally. No evidence of bacterial infection or drainage bilaterally.  Orthopedic  No limitations of motion  feet .  No crepitus or effusions noted.  No bony pathology or digital deformities noted.  Skin  normotropic skin with no porokeratosis noted bilaterally.  No signs of infections or ulcers noted.     Onychomycosis  Pain in right toes  Pain in left toes  Consent was obtained for treatment procedures.   Mechanical debridement of nails 1-5  bilaterally performed with a nail nipper.  Filed with dremel without incident.    Return office visit   3 months                   Told patient to return for periodic foot care and evaluation due to potential at risk complications.   Emberley Kral DPM  
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
## 2023-03-18 ENCOUNTER — Encounter: Payer: Self-pay | Admitting: Student

## 2023-03-18 ENCOUNTER — Ambulatory Visit: Payer: Medicaid Other | Admitting: Student

## 2023-03-18 VITALS — BP 126/68 | HR 77 | Temp 98.9°F | Ht 74.0 in | Wt 383.0 lb

## 2023-03-18 DIAGNOSIS — J432 Centrilobular emphysema: Secondary | ICD-10-CM | POA: Diagnosis not present

## 2023-03-18 MED ORDER — TRELEGY ELLIPTA 200-62.5-25 MCG/ACT IN AEPB: 1.0000 | INHALATION_SPRAY | Freq: Every day | RESPIRATORY_TRACT | 11 refills | Status: AC

## 2023-03-18 MED ORDER — ALBUTEROL SULFATE HFA 108 (90 BASE) MCG/ACT IN AERS: 1.0000 | INHALATION_SPRAY | Freq: Four times a day (QID) | RESPIRATORY_TRACT | 11 refills | Status: DC | PRN

## 2023-03-18 MED ORDER — TRELEGY ELLIPTA 200-62.5-25 MCG/ACT IN AEPB: 1.0000 | INHALATION_SPRAY | Freq: Every day | RESPIRATORY_TRACT | 0 refills | Status: DC

## 2023-03-18 NOTE — Patient Instructions (Addendum)
-   trelegy 1 puff once daily - stop combivent - albuterol is your rescue inhaler now, use 1-2 puffs as needed   - zyrtec daily and flonase 1 spray daily during pollen season  - try to have your PAP device ready at your couch or bed whenever you might fall asleep watching TV - see you in 6 months or sooner if needed!

## 2023-03-19 ENCOUNTER — Other Ambulatory Visit: Payer: Self-pay | Admitting: Podiatry

## 2023-04-15 ENCOUNTER — Telehealth: Payer: Self-pay | Admitting: Student

## 2023-04-15 ENCOUNTER — Telehealth: Payer: Self-pay

## 2023-04-15 NOTE — Telephone Encounter (Signed)
PA request received via CMM/provider for Trelegy Ellipta 200-62.5-25MCG/ACT aerosol powder  PA has been submitted to [facility] [employer] and has been APPROVED from 04/15/2023-04/13/2024  Key: [facility]

## 2023-04-15 NOTE — Telephone Encounter (Signed)
PT trying to get more Trelegy but Pharm said he needs a PA. He needs more and asks that we get this done ASAP. States he is almost out of his samples. Can we leave him more until this is resolved.  His number is [phone]   Ilda Basset is [facility] on [address]

## 2023-04-15 NOTE — Telephone Encounter (Signed)
PA has been APPROVED.

## 2023-04-18 NOTE — Telephone Encounter (Signed)
Called pt's pharmacy to let them know that the PA was approved for pt's Trelegy and they were able to run it through and will get the Rx ready for pt.nothing further needed.

## 2023-04-20 ENCOUNTER — Other Ambulatory Visit: Payer: Self-pay | Admitting: Podiatry

## 2023-06-04 ENCOUNTER — Other Ambulatory Visit: Payer: Self-pay | Admitting: Acute Care

## 2023-06-04 DIAGNOSIS — Z87891 Personal history of nicotine dependence: Secondary | ICD-10-CM

## 2023-06-04 DIAGNOSIS — Z122 Encounter for screening for malignant neoplasm of respiratory organs: Secondary | ICD-10-CM

## 2023-06-13 ENCOUNTER — Ambulatory Visit: Payer: Medicaid Other | Admitting: Podiatry

## 2023-06-13 ENCOUNTER — Encounter: Payer: Self-pay | Admitting: Podiatry

## 2023-06-13 DIAGNOSIS — M79674 Pain in right toe(s): Secondary | ICD-10-CM

## 2023-06-13 DIAGNOSIS — B351 Tinea unguium: Secondary | ICD-10-CM | POA: Diagnosis not present

## 2023-06-13 DIAGNOSIS — G6289 Other specified polyneuropathies: Secondary | ICD-10-CM

## 2023-06-13 DIAGNOSIS — M79675 Pain in left toe(s): Secondary | ICD-10-CM | POA: Diagnosis not present

## 2023-06-13 NOTE — Progress Notes (Signed)
This patient returns to my office for at risk foot care.  This patient requires this care by a professional since this patient will be at risk due to having diet controlled diabetes.  This patient is unable to cut nails himself since the patient cannot reach his nails.These nails are painful walking and wearing shoes.  This patient presents for at risk foot care today.  General Appearance  Alert, conversant and in no acute stress.  Vascular  Dorsalis pedis and posterior tibial  pulses are palpable  bilaterally.  Capillary return is within normal limits  bilaterally. Temperature is within normal limits  bilaterally.  Neurologic  Senn-Weinstein monofilament wire test within normal limits  bilaterally. Muscle power within normal limits bilaterally.  Nails Thick disfigured discolored nails with subungual debris  from hallux to fifth toes bilaterally. No evidence of bacterial infection or drainage bilaterally.  Orthopedic  No limitations of motion  feet .  No crepitus or effusions noted.  No bony pathology or digital deformities noted.  Skin  normotropic skin with no porokeratosis noted bilaterally.  No signs of infections or ulcers noted.     Onychomycosis  Pain in right toes  Pain in left toes  Consent was obtained for treatment procedures.   Mechanical debridement of nails 1-5  bilaterally performed with a nail nipper.  Filed with dremel without incident.    Return office visit   3 months                   Told patient to return for periodic foot care and evaluation due to potential at risk complications.   Taje Littler DPM  

## 2023-06-23 ENCOUNTER — Other Ambulatory Visit: Payer: Self-pay

## 2023-06-23 ENCOUNTER — Encounter (HOSPITAL_COMMUNITY): Payer: Self-pay

## 2023-06-23 ENCOUNTER — Emergency Department (HOSPITAL_COMMUNITY)
Admission: EM | Admit: 2023-06-23 | Discharge: 2023-06-24 | Disposition: A | Discharge status: Home / Self Care | Payer: Medicaid Other | Attending: Emergency Medicine | Admitting: Emergency Medicine

## 2023-06-23 DIAGNOSIS — Z7901 Long term (current) use of anticoagulants: Secondary | ICD-10-CM | POA: Diagnosis not present

## 2023-06-23 DIAGNOSIS — I10 Essential (primary) hypertension: Secondary | ICD-10-CM | POA: Insufficient documentation

## 2023-06-23 DIAGNOSIS — J449 Chronic obstructive pulmonary disease, unspecified: Secondary | ICD-10-CM | POA: Diagnosis not present

## 2023-06-23 DIAGNOSIS — M62838 Other muscle spasm: Secondary | ICD-10-CM | POA: Diagnosis present

## 2023-06-23 LAB — CBC WITH DIFFERENTIAL/PLATELET
Abs Immature Granulocytes: 0.02 10*3/uL (ref 0.00–0.07)
Basophils Absolute: 0 10*3/uL (ref 0.0–0.1)
Basophils Relative: 1 %
Eosinophils Absolute: 0.3 10*3/uL (ref 0.0–0.5)
Eosinophils Relative: 4 %
HCT: 38.6 % — ABNORMAL LOW (ref 39.0–52.0)
Hemoglobin: 12 g/dL — ABNORMAL LOW (ref 13.0–17.0)
Immature Granulocytes: 0 %
Lymphocytes Relative: 18 %
Lymphs Abs: 1.5 10*3/uL (ref 0.7–4.0)
MCH: 25.6 pg — ABNORMAL LOW (ref 26.0–34.0)
MCHC: 31.1 g/dL (ref 30.0–36.0)
MCV: 82.3 fL (ref 80.0–100.0)
Monocytes Absolute: 0.6 10*3/uL (ref 0.1–1.0)
Monocytes Relative: 8 %
Neutro Abs: 5.7 10*3/uL (ref 1.7–7.7)
Neutrophils Relative %: 69 %
Platelets: 204 10*3/uL (ref 150–400)
RBC: 4.69 MIL/uL (ref 4.22–5.81)
RDW: 13.4 % (ref 11.5–15.5)
WBC: 8.1 10*3/uL (ref 4.0–10.5)
nRBC: 0 % (ref 0.0–0.2)

## 2023-06-23 MED ORDER — IBUPROFEN 400 MG PO TABS
600.0000 mg | ORAL_TABLET | Freq: Once | ORAL | Status: AC
  Administered 2023-06-23: 600 mg via ORAL
  Filled 2023-06-23: qty 1

## 2023-06-23 NOTE — ED Triage Notes (Signed)
Complaining of muscle cramps in his legs, shoulders and hands. Started 2-3 weeks ago and has gotten worse.

## 2023-06-24 LAB — BASIC METABOLIC PANEL
Anion gap: 10 (ref 5–15)
BUN: 11 mg/dL (ref 8–23)
CO2: 25 mmol/L (ref 22–32)
Calcium: 9.2 mg/dL (ref 8.9–10.3)
Chloride: 103 mmol/L (ref 98–111)
Creatinine, Ser: 1.35 mg/dL — ABNORMAL HIGH (ref 0.61–1.24)
GFR, Estimated: 59 mL/min — ABNORMAL LOW (ref >60)
Glucose, Bld: 126 mg/dL — ABNORMAL HIGH (ref 70–99)
Potassium: 3.6 mmol/L (ref 3.5–5.1)
Sodium: 138 mmol/L (ref 135–145)

## 2023-06-24 LAB — CK: Total CK: 547 U/L — ABNORMAL HIGH (ref 49–397)

## 2023-06-24 LAB — HEPATIC FUNCTION PANEL
ALT: 39 U/L (ref 0–44)
AST: 36 U/L (ref 15–41)
Albumin: 3.7 g/dL (ref 3.5–5.0)
Alkaline Phosphatase: 101 U/L (ref 38–126)
Bilirubin, Direct: <0.1 mg/dL (ref 0.0–0.2)
Total Bilirubin: 0.4 mg/dL (ref 0.3–1.2)
Total Protein: 7.2 g/dL (ref 6.5–8.1)

## 2023-06-24 LAB — MAGNESIUM: Magnesium: 1.9 mg/dL (ref 1.7–2.4)

## 2023-06-24 NOTE — Discharge Instructions (Addendum)
Follow up with your primary care provider. Return to the ER for worsening or concerning symptoms.

## 2023-06-24 NOTE — ED Notes (Signed)
Patient once again asleep in hallway vs wnl respirations even and non labored

## 2023-06-24 NOTE — ED Provider Notes (Signed)
Kenneth Davila EMERGENCY DEPARTMENT AT [facility] Provider Note   CSN: [id number] Arrival date & time: 06/23/23  2206     History  Chief Complaint  Patient presents with   Spasms    Kenneth Davila is a 67 y.o. male.  67 year old male with history of COPD, GERD, neuropathy, HTN, presents with complaint of muscle cramping in his legs for the past 3-4 weeks, waking him from his sleep at night. Also with cramping in his shoulder and arms. 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 Denies fevers, chills, change in cough, SHOB, recent illness or falls. Has not been to PCP for this.        Home Medications Prior to Admission medications   Medication Sig Start Date End Date Taking? Authorizing Provider  albuterol (VENTOLIN HFA) 108 (90 Base) MCG/ACT inhaler Inhale 1-2 puffs into the lungs every 6 (six) hours as needed for wheezing or shortness of breath. 03/18/23   Omar Person, MD  cetirizine (ZYRTEC) 10 MG tablet Take 10 mg by mouth daily. 09/21/22   [provider]  fluticasone (FLONASE) 50 MCG/ACT nasal spray Place 1 spray into both nostrils as needed. 08/23/22   [provider]  Fluticasone-Umeclidin-Vilant (TRELEGY ELLIPTA) 200-62.5-25 MCG/ACT AEPB Inhale 1 puff into the lungs daily. 03/18/23   Omar Person, MD  Ibuprofen-Acetaminophen (ADVIL DUAL ACTION) 125-250 MG TABS Take 2 tablets by mouth 2 (two) times daily as needed (pain).    [provider]  losartan (COZAAR) 50 MG tablet Take 50 mg by mouth daily. 08/21/20   [provider]  meloxicam (MOBIC) 15 MG tablet Take 15 mg by mouth daily. 11/18/22   [provider]  Misc Natural Products (PROSTATE CONTROL PO) Take 1 capsule by mouth daily.    [provider]  nystatin (MYCOSTATIN) 100000 UNIT/ML suspension Take 5 mLs (500,000 Units total) by mouth 4 (four) times daily. 06/25/22   Omar Person, MD   oxyCODONE (ROXICODONE) 15 MG immediate release tablet Take 15 mg by mouth 3 (three) times daily. 02/28/23   [provider]  pregabalin (LYRICA) 150 MG capsule Take 150 mg by mouth daily.    [provider]  pregabalin (LYRICA) 75 MG capsule Take 75 mg by mouth 3 x daily with food.    [provider]  traMADol (ULTRAM) 50 MG tablet Take 50 mg by mouth 2 (two) times daily as needed. 07/26/22   [provider]  apixaban (ELIQUIS) 5 MG TABS tablet Take 2 tablets (10mg ) twice daily for 7 days, then 1 tablet (5mg ) twice daily 12/28/19 07/22/20  Dorcas Carrow, MD      Allergies    Amlodipine    Review of Systems   Review of Systems Negative except as per HPI Physical Exam Updated Vital Signs BP (!) 133/54 (BP Location: Left Arm)   Pulse 75   Temp 98.3 F (36.8 C) (Oral)   Resp 18   Ht 6\' 2"  (1.88 m)   Wt (!) 172.4 kg   SpO2 96%   BMI 48.79 kg/m  Physical Exam Vitals and nursing note reviewed.  Constitutional:      General: He is not in acute distress.    Appearance: He is well-developed. He is obese. He is not diaphoretic.  HENT:     Head: Normocephalic and atraumatic.  Cardiovascular:     Rate and Rhythm: Normal rate and regular rhythm.  Heart sounds: Normal heart sounds.  Pulmonary:     Effort: Pulmonary effort is normal.     Breath sounds: Normal breath sounds.  Abdominal:     Palpations: Abdomen is soft.     Tenderness: There is no abdominal tenderness.  Musculoskeletal:        General: No tenderness.     Right lower leg: No edema.     Left lower leg: No edema.     Comments: No calf tenderness   Skin:    General: Skin is warm and dry.     Findings: No erythema or rash.  Neurological:     Mental Status: He is alert and oriented to person, place, and time.  Psychiatric:        Behavior: Behavior normal.     ED Results / Procedures / Treatments   Labs (all labs ordered are listed, but only abnormal results are displayed) Labs  Reviewed  BASIC METABOLIC PANEL - Abnormal; Notable for the following components:      Result Value   Glucose, Bld 126 (*)    Creatinine, Ser 1.35 (*)    GFR, Estimated 59 (*)    All other components within normal limits  CBC WITH DIFFERENTIAL/PLATELET - Abnormal; Notable for the following components:   Hemoglobin 12.0 (*)    HCT 38.6 (*)    MCH 25.6 (*)    All other components within normal limits  CK - Abnormal; Notable for the following components:   Total CK 547 (*)    All other components within normal limits  HEPATIC FUNCTION PANEL  MAGNESIUM    EKG None  Radiology No results found.  Procedures Procedures    Medications Ordered in ED Medications  ibuprofen (ADVIL) tablet 600 mg (600 mg Oral Given 06/23/23 2335)    ED Course/ Medical Decision Making/ A&P                             Medical Decision Making Amount and/or Complexity of Data Reviewed Labs: ordered.   67 year old male with concern for muscle cramps. Onset 3-4 weeks ago in his legs, now also involve his upper extremities. Patient feels appropriately hydrated, has not been exposed to the high temperatures lately.  Labs reviewed. CK slightly elevated at 547. CBC without significant change. CMP with slight increase in Cr compared to prior labs on file. Mg WNL, K WNL.  Recommend OTC magnesium to help with muscle spasms and recheck with PCP.  Discussed with ER attending, Dr. Clayborne Dana, agrees with plan of care.         Final Clinical Impression(s) / ED Diagnoses Final diagnoses:  Muscle spasm    Rx / DC Orders ED Discharge Orders     None         Jeannie Fend, PA-C 06/24/23 1610    Marily Memos, MD 06/24/23 [phone]

## 2023-06-24 NOTE — ED Notes (Signed)
Assumed care of patient sleeping on bed in hallway. Patient reports 3 week hx of neuropathy with spasms to hands arms legs. Patient uses cane to get around. Patient a/o x 4 respirations even and non labored has full rom to all extremities and is full wt bearing. Patient denies injury cms intact.

## 2023-06-24 NOTE — ED Notes (Signed)
Patient awake yelling about having shoulder spasms

## 2023-08-15 ENCOUNTER — Ambulatory Visit
Admission: RE | Admit: 2023-08-15 | Discharge: 2023-08-15 | Disposition: A | Discharge status: Home / Self Care | Payer: Medicare HMO | Source: Ambulatory Visit | Attending: Internal Medicine | Admitting: Internal Medicine

## 2023-08-15 DIAGNOSIS — Z87891 Personal history of nicotine dependence: Secondary | ICD-10-CM

## 2023-08-15 DIAGNOSIS — Z122 Encounter for screening for malignant neoplasm of respiratory organs: Secondary | ICD-10-CM

## 2023-08-26 ENCOUNTER — Other Ambulatory Visit: Payer: Self-pay

## 2023-08-26 DIAGNOSIS — Z122 Encounter for screening for malignant neoplasm of respiratory organs: Secondary | ICD-10-CM

## 2023-08-26 DIAGNOSIS — Z87891 Personal history of nicotine dependence: Secondary | ICD-10-CM

## 2023-09-13 ENCOUNTER — Encounter: Payer: Self-pay | Admitting: Podiatry

## 2023-09-13 ENCOUNTER — Ambulatory Visit (INDEPENDENT_AMBULATORY_CARE_PROVIDER_SITE_OTHER): Payer: Medicare HMO | Admitting: Podiatry

## 2023-09-13 DIAGNOSIS — B351 Tinea unguium: Secondary | ICD-10-CM | POA: Diagnosis not present

## 2023-09-13 DIAGNOSIS — G6289 Other specified polyneuropathies: Secondary | ICD-10-CM | POA: Diagnosis not present

## 2023-09-13 DIAGNOSIS — E119 Type 2 diabetes mellitus without complications: Secondary | ICD-10-CM | POA: Diagnosis not present

## 2023-09-13 DIAGNOSIS — M79675 Pain in left toe(s): Secondary | ICD-10-CM | POA: Diagnosis not present

## 2023-09-13 DIAGNOSIS — M79674 Pain in right toe(s): Secondary | ICD-10-CM

## 2023-09-13 NOTE — Progress Notes (Signed)
This patient returns to my office for at risk foot care.  This patient requires this care by a professional since this patient will be at risk due to having diet controlled diabetes.  This patient is unable to cut nails himself since the patient cannot reach his nails.These nails are painful walking and wearing shoes.  This patient presents for at risk foot care today.  General Appearance  Alert, conversant and in no acute stress.  Vascular  Dorsalis pedis and posterior tibial  pulses are palpable  bilaterally.  Capillary return is within normal limits  bilaterally. Temperature is within normal limits  bilaterally.  Neurologic  Senn-Weinstein monofilament wire test within normal limits  bilaterally. Muscle power within normal limits bilaterally.  Nails Thick disfigured discolored nails with subungual debris  from hallux to fifth toes bilaterally. No evidence of bacterial infection or drainage bilaterally.  Orthopedic  No limitations of motion  feet .  No crepitus or effusions noted.  No bony pathology or digital deformities noted.  Skin  normotropic skin with no porokeratosis noted bilaterally.  No signs of infections or ulcers noted.     Onychomycosis  Pain in right toes  Pain in left toes  Consent was obtained for treatment procedures.   Mechanical debridement of nails 1-5  bilaterally performed with a nail nipper.  Filed with dremel without incident.    Return office visit   3 months                   Told patient to return for periodic foot care and evaluation due to potential at risk complications.   Gardiner Barefoot DPM

## 2023-11-01 ENCOUNTER — Emergency Department (HOSPITAL_COMMUNITY)
Admission: EM | Admit: 2023-11-01 | Discharge: 2023-11-02 | Disposition: A | Discharge status: Home / Self Care | Payer: Medicare HMO | Attending: Student | Admitting: Student

## 2023-11-01 ENCOUNTER — Other Ambulatory Visit: Payer: Self-pay

## 2023-11-01 ENCOUNTER — Encounter (HOSPITAL_COMMUNITY): Payer: Self-pay

## 2023-11-01 DIAGNOSIS — Z1152 Encounter for screening for COVID-19: Secondary | ICD-10-CM | POA: Diagnosis not present

## 2023-11-01 DIAGNOSIS — Z79899 Other long term (current) drug therapy: Secondary | ICD-10-CM | POA: Insufficient documentation

## 2023-11-01 DIAGNOSIS — Z8616 Personal history of COVID-19: Secondary | ICD-10-CM | POA: Insufficient documentation

## 2023-11-01 DIAGNOSIS — Z87891 Personal history of nicotine dependence: Secondary | ICD-10-CM | POA: Diagnosis not present

## 2023-11-01 DIAGNOSIS — I10 Essential (primary) hypertension: Secondary | ICD-10-CM | POA: Diagnosis not present

## 2023-11-01 DIAGNOSIS — D72829 Elevated white blood cell count, unspecified: Secondary | ICD-10-CM | POA: Insufficient documentation

## 2023-11-01 DIAGNOSIS — R4182 Altered mental status, unspecified: Secondary | ICD-10-CM | POA: Insufficient documentation

## 2023-11-01 DIAGNOSIS — Z7901 Long term (current) use of anticoagulants: Secondary | ICD-10-CM | POA: Insufficient documentation

## 2023-11-01 DIAGNOSIS — J45909 Unspecified asthma, uncomplicated: Secondary | ICD-10-CM | POA: Diagnosis not present

## 2023-11-01 DIAGNOSIS — J189 Pneumonia, unspecified organism: Secondary | ICD-10-CM

## 2023-11-01 DIAGNOSIS — R109 Unspecified abdominal pain: Secondary | ICD-10-CM | POA: Diagnosis present

## 2023-11-01 DIAGNOSIS — R509 Fever, unspecified: Secondary | ICD-10-CM

## 2023-11-01 DIAGNOSIS — Z7951 Long term (current) use of inhaled steroids: Secondary | ICD-10-CM | POA: Insufficient documentation

## 2023-11-01 LAB — CBC
HCT: 45.7 % (ref 39.0–52.0)
Hemoglobin: 14.5 g/dL (ref 13.0–17.0)
MCH: 25.4 pg — ABNORMAL LOW (ref 26.0–34.0)
MCHC: 31.7 g/dL (ref 30.0–36.0)
MCV: 80 fL (ref 80.0–100.0)
Platelets: 161 10*3/uL (ref 150–400)
RBC: 5.71 MIL/uL (ref 4.22–5.81)
RDW: 13.5 % (ref 11.5–15.5)
WBC: 13 10*3/uL — ABNORMAL HIGH (ref 4.0–10.5)
nRBC: 0 % (ref 0.0–0.2)

## 2023-11-01 LAB — URINALYSIS, ROUTINE W REFLEX MICROSCOPIC
Bilirubin Urine: NEGATIVE
Glucose, UA: NEGATIVE mg/dL
Hgb urine dipstick: NEGATIVE
Ketones, ur: NEGATIVE mg/dL
Leukocytes,Ua: NEGATIVE
Nitrite: NEGATIVE
Protein, ur: NEGATIVE mg/dL
Specific Gravity, Urine: 1.017 (ref 1.005–1.030)
pH: 5 (ref 5.0–8.0)

## 2023-11-01 LAB — COMPREHENSIVE METABOLIC PANEL
ALT: 27 U/L (ref 0–44)
AST: 30 U/L (ref 15–41)
Albumin: 4.7 g/dL (ref 3.5–5.0)
Alkaline Phosphatase: 93 U/L (ref 38–126)
Anion gap: 9 (ref 5–15)
BUN: 17 mg/dL (ref 8–23)
CO2: 24 mmol/L (ref 22–32)
Calcium: 9.5 mg/dL (ref 8.9–10.3)
Chloride: 103 mmol/L (ref 98–111)
Creatinine, Ser: 1.38 mg/dL — ABNORMAL HIGH (ref 0.61–1.24)
GFR, Estimated: 57 mL/min — ABNORMAL LOW (ref >60)
Glucose, Bld: 131 mg/dL — ABNORMAL HIGH (ref 70–99)
Potassium: 4.1 mmol/L (ref 3.5–5.1)
Sodium: 136 mmol/L (ref 135–145)
Total Bilirubin: 0.8 mg/dL (ref <1.2)
Total Protein: 8 g/dL (ref 6.5–8.1)

## 2023-11-01 LAB — LIPASE, BLOOD: Lipase: 26 U/L (ref 11–51)

## 2023-11-01 MED ORDER — PREGABALIN 50 MG PO CAPS
150.0000 mg | ORAL_CAPSULE | Freq: Every day | ORAL | Status: DC
  Administered 2023-11-02: 150 mg via ORAL
  Filled 2023-11-01: qty 3

## 2023-11-01 MED ORDER — OXYCODONE HCL 5 MG PO TABS
15.0000 mg | ORAL_TABLET | Freq: Three times a day (TID) | ORAL | Status: DC
  Administered 2023-11-02: 15 mg via ORAL
  Filled 2023-11-01: qty 3

## 2023-11-01 MED ORDER — ACETAMINOPHEN 325 MG PO TABS
650.0000 mg | ORAL_TABLET | Freq: Once | ORAL | Status: AC
  Administered 2023-11-01: 650 mg via ORAL
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
## 2023-11-02 ENCOUNTER — Emergency Department (HOSPITAL_COMMUNITY): Payer: Medicare HMO

## 2023-11-02 DIAGNOSIS — R109 Unspecified abdominal pain: Secondary | ICD-10-CM | POA: Diagnosis not present

## 2023-11-02 LAB — BLOOD GAS, VENOUS
Acid-Base Excess: 5.2 mmol/L — ABNORMAL HIGH (ref 0.0–2.0)
Bicarbonate: 31.5 mmol/L — ABNORMAL HIGH (ref 20.0–28.0)
O2 Saturation: 27.4 %
Patient temperature: 36.1
pCO2, Ven: 50 mm[Hg] (ref 44–60)
pH, Ven: 7.4 (ref 7.25–7.43)
pO2, Ven: <31 mm[Hg] — CL (ref 32–45)

## 2023-11-02 LAB — SARS CORONAVIRUS 2 BY RT PCR: SARS Coronavirus 2 by RT PCR: NEGATIVE

## 2023-11-02 MED ORDER — IOHEXOL 300 MG/ML  SOLN
100.0000 mL | Freq: Once | INTRAMUSCULAR | Status: AC | PRN
  Administered 2023-11-02: 100 mL via INTRAVENOUS

## 2023-11-02 MED ORDER — DOXYCYCLINE HYCLATE 100 MG PO TABS
100.0000 mg | ORAL_TABLET | Freq: Once | ORAL | Status: AC
  Administered 2023-11-02: 100 mg via ORAL
  Filled 2023-11-02: qty 1

## 2023-11-02 MED ORDER — AMOXICILLIN-POT CLAVULANATE 875-125 MG PO TABS
1.0000 | ORAL_TABLET | Freq: Once | ORAL | Status: AC
  Administered 2023-11-02: 1 via ORAL
  Filled 2023-11-02: qty 1

## 2023-11-02 MED ORDER — AMOXICILLIN-POT CLAVULANATE 875-125 MG PO TABS
1.0000 | ORAL_TABLET | Freq: Two times a day (BID) | ORAL | 0 refills | Status: DC
Start: 2023-11-02 — End: 2023-12-27

## 2023-11-02 MED ORDER — DOXYCYCLINE HYCLATE 100 MG PO CAPS: 100.0000 mg | ORAL_CAPSULE | Freq: Two times a day (BID) | ORAL | 0 refills | Status: DC
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
## 2023-11-07 LAB — CULTURE, BLOOD (ROUTINE X 2)
Culture: NO GROWTH
Special Requests: ADEQUATE

## 2023-11-28 ENCOUNTER — Encounter: Payer: Self-pay | Admitting: Internal Medicine

## 2023-12-05 ENCOUNTER — Other Ambulatory Visit (HOSPITAL_COMMUNITY): Payer: Self-pay | Admitting: Internal Medicine

## 2023-12-05 DIAGNOSIS — I739 Peripheral vascular disease, unspecified: Secondary | ICD-10-CM

## 2023-12-06 ENCOUNTER — Ambulatory Visit (HOSPITAL_COMMUNITY)
Admission: RE | Admit: 2023-12-06 | Discharge: 2023-12-06 | Disposition: A | Discharge status: Home / Self Care | Payer: Medicare HMO | Source: Ambulatory Visit | Attending: Vascular Surgery | Admitting: Vascular Surgery

## 2023-12-06 DIAGNOSIS — I739 Peripheral vascular disease, unspecified: Secondary | ICD-10-CM | POA: Insufficient documentation

## 2023-12-06 LAB — VAS US ABI WITH/WO TBI
Left ABI: 1.02
Right ABI: 1.19

## 2023-12-10 ENCOUNTER — Ambulatory Visit (AMBULATORY_SURGERY_CENTER): Payer: Medicare HMO

## 2023-12-10 VITALS — Ht 74.0 in | Wt 363.0 lb

## 2023-12-10 DIAGNOSIS — Z860100 Personal history of colon polyps, unspecified: Secondary | ICD-10-CM

## 2023-12-10 DIAGNOSIS — Z800 Family history of malignant neoplasm of digestive organs: Secondary | ICD-10-CM

## 2023-12-10 MED ORDER — NA SULFATE-K SULFATE-MG SULF 17.5-3.13-1.6 GM/177ML PO SOLN: 1.0000 | Freq: Once | ORAL | 0 refills | Status: AC

## 2023-12-10 NOTE — Progress Notes (Signed)
No egg or soy allergy known to patient  No issues known to pt with past sedation with any surgeries or procedures Patient denies ever being told they had issues or difficulty with intubation  No FH of Malignant Hyperthermia Pt is not on diet pills Ozempic 7 day hold advised pt made aware  Pt is not on  home 02 - Pt is using a Bipap machine  Pt is not on blood thinners  Pt reports some drug induced constipation goes about every 2 days  No A fib or A flutter Have any cardiac testing pending-- no  LOA: independent  Prep:   Patient's chart reviewed by Cathlyn Parsons CNRA prior to previsit and patient appropriate for the [facility].  Previsit completed and red dot placed by patient's name on their procedure day (on provider's schedule).     PV competed with patient. Prep instructions sent via mychart and home address. Goodrx coupon for [facility] and [facility]  provided to use for price reduction if needed.

## 2023-12-13 ENCOUNTER — Encounter: Payer: Self-pay | Admitting: Podiatry

## 2023-12-13 ENCOUNTER — Ambulatory Visit (INDEPENDENT_AMBULATORY_CARE_PROVIDER_SITE_OTHER): Payer: Medicare HMO | Admitting: Podiatry

## 2023-12-13 DIAGNOSIS — M79674 Pain in right toe(s): Secondary | ICD-10-CM

## 2023-12-13 DIAGNOSIS — E119 Type 2 diabetes mellitus without complications: Secondary | ICD-10-CM

## 2023-12-13 DIAGNOSIS — M79675 Pain in left toe(s): Secondary | ICD-10-CM

## 2023-12-13 DIAGNOSIS — B351 Tinea unguium: Secondary | ICD-10-CM | POA: Diagnosis not present

## 2023-12-13 NOTE — Progress Notes (Signed)
This patient returns to my office for at risk foot care.  This patient requires this care by a professional since this patient will be at risk due to having diet controlled diabetes.  This patient is unable to cut nails himself since the patient cannot reach his nails.These nails are painful walking and wearing shoes.  This patient presents for at risk foot care today.  General Appearance  Alert, conversant and in no acute stress.  Vascular  Dorsalis pedis and posterior tibial  pulses are palpable  bilaterally.  Capillary return is within normal limits  bilaterally. Temperature is within normal limits  bilaterally.  Neurologic  Senn-Weinstein monofilament wire test within normal limits  bilaterally. Muscle power within normal limits bilaterally.  Nails Thick disfigured discolored nails with subungual debris  from hallux to fifth toes bilaterally. No evidence of bacterial infection or drainage bilaterally.  Orthopedic  No limitations of motion  feet .  No crepitus or effusions noted.  No bony pathology or digital deformities noted.  Skin  normotropic skin with no porokeratosis noted bilaterally.  No signs of infections or ulcers noted.     Onychomycosis  Pain in right toes  Pain in left toes  Consent was obtained for treatment procedures.   Mechanical debridement of nails 1-5  bilaterally performed with a nail nipper.  Filed with dremel without incident.    Return office visit    10 weeks                Told patient to return for periodic foot care and evaluation due to potential at risk complications.   Helane Gunther DPM

## 2023-12-26 ENCOUNTER — Encounter: Payer: Self-pay | Admitting: Certified Registered Nurse Anesthetist

## 2023-12-27 ENCOUNTER — Ambulatory Visit (AMBULATORY_SURGERY_CENTER): Payer: 59 | Admitting: Internal Medicine

## 2023-12-27 ENCOUNTER — Encounter: Payer: Self-pay | Admitting: Internal Medicine

## 2023-12-27 VITALS — BP 124/52 | HR 69 | Temp 97.7°F | Resp 15 | Ht 74.0 in | Wt 351.2 lb

## 2023-12-27 DIAGNOSIS — Z1211 Encounter for screening for malignant neoplasm of colon: Secondary | ICD-10-CM

## 2023-12-27 DIAGNOSIS — Z800 Family history of malignant neoplasm of digestive organs: Secondary | ICD-10-CM

## 2023-12-27 DIAGNOSIS — K635 Polyp of colon: Secondary | ICD-10-CM

## 2023-12-27 DIAGNOSIS — D122 Benign neoplasm of ascending colon: Secondary | ICD-10-CM | POA: Diagnosis not present

## 2023-12-27 DIAGNOSIS — K648 Other hemorrhoids: Secondary | ICD-10-CM

## 2023-12-27 DIAGNOSIS — Z860100 Personal history of colon polyps, unspecified: Secondary | ICD-10-CM

## 2023-12-27 DIAGNOSIS — D123 Benign neoplasm of transverse colon: Secondary | ICD-10-CM

## 2023-12-27 HISTORY — DX: Morbid (severe) obesity due to excess calories: E66.01

## 2023-12-27 MED ORDER — SODIUM CHLORIDE 0.9 % IV SOLN: 500.0000 mL | Freq: Once | INTRAVENOUS | Status: AC

## 2023-12-27 NOTE — Patient Instructions (Addendum)
    Handouts on polyps  & hemorrhoids given to you today.   Await pathology results on polyps removed    Repeat Colonoscopy in 6 months - should get a reminder in the mail or my chart to make this appointment     YOU HAD AN ENDOSCOPIC PROCEDURE TODAY AT THE [facility]:   Refer to the procedure report that was given to you for any specific questions about what was found during the examination.  If the procedure report does not answer your questions, please call your gastroenterologist to clarify.  If you requested that your care partner not be given the details of your procedure findings, then the procedure report has been included in a sealed envelope for you to review at your convenience later.  YOU SHOULD EXPECT: Some feelings of bloating in the abdomen. Passage of more gas than usual.  Walking can help get rid of the air that was put into your GI tract during the procedure and reduce the bloating. If you had a lower endoscopy (such as a colonoscopy or flexible sigmoidoscopy) you may notice spotting of blood in your stool or on the toilet paper. If you underwent a bowel prep for your procedure, you may not have a normal bowel movement for a few days.  Please Note:  You might notice some irritation and congestion in your nose or some drainage.  This is from the oxygen used during your procedure.  There is no need for concern and it should clear up in a day or so.  SYMPTOMS TO REPORT IMMEDIATELY:  Following lower endoscopy (colonoscopy or flexible sigmoidoscopy):  Excessive amounts of blood in the stool  Significant tenderness or worsening of abdominal pains  Swelling of the abdomen that is new, acute  Fever of 100F or higher   For urgent or emergent issues, a gastroenterologist can be reached at any hour by calling [phone]. Do not use MyChart messaging for urgent concerns.    DIET:  We do recommend a small meal at first, but then you may proceed to your regular  diet.  Drink plenty of fluids but you should avoid alcoholic beverages for 24 hours.  ACTIVITY:  You should plan to take it easy for the rest of today and you should NOT DRIVE or use heavy machinery until tomorrow (because of the sedation medicines used during the test).    FOLLOW UP: Our staff will call the number listed on your records the next business day following your procedure.  We will call around 7:15- 8:00 am to check on you and address any questions or concerns that you may have regarding the information given to you following your procedure. If we do not reach you, we will leave a message.     If any biopsies were taken you will be contacted by phone or by letter within the next 1-3 weeks.  Please call us  at [phone] if you have not heard about the biopsies in 3 weeks.    SIGNATURES/CONFIDENTIALITY: You and/or your care partner have signed paperwork which will be entered into your electronic medical record.  These signatures attest to the fact that that the information above on your After Visit Summary has been reviewed and is understood.  Full responsibility of the confidentiality of this discharge information lies with you and/or your care-partner.

## 2023-12-27 NOTE — Progress Notes (Signed)
 Report given to PACU, vss

## 2023-12-27 NOTE — Progress Notes (Signed)
 Called to room to assist during endoscopic procedure.  Patient ID and intended procedure confirmed with present staff. Received instructions for my participation in the procedure from the performing physician.

## 2023-12-27 NOTE — Progress Notes (Signed)
 Pt's states no medical or surgical changes since previsit or office visit.
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
## 2023-12-27 NOTE — Op Note (Signed)
 [facility] Patient Name: Kenneth Davila Procedure Date: 12/27/2023 1:58 PM MRN: [id number] Endoscopist: Gordy CHRISTELLA Starch , MD, [id number] Age: 68 Referring MD:  Date of Birth: October 26, 1956 Gender: Male Account #: [id number] Procedure:                Colonoscopy Indications:              High risk colon cancer surveillance: Personal                            history of sessile serrated colon polyps (10 mm or                            greater in size); 2015 (SSP x 9, HP x 8), Dec 2016                            last exam (SSP x 4, HP x 9) Medicines:                Monitored Anesthesia Care Procedure:                Pre-Anesthesia Assessment:                           - Prior to the procedure, a History and Physical                            was performed, and patient medications and                            allergies were reviewed. The patient's tolerance of                            previous anesthesia was also reviewed. The risks                            and benefits of the procedure and the sedation                            options and risks were discussed with the patient.                            All questions were answered, and informed consent                            was obtained. Prior Anticoagulants: The patient has                            taken no anticoagulant or antiplatelet agents. ASA                            Grade Assessment: III - A patient with severe                            systemic disease. After reviewing the risks and  benefits, the patient was deemed in satisfactory                            condition to undergo the procedure.                           After obtaining informed consent, the colonoscope                            was passed under direct vision. Throughout the                            procedure, the patient's blood pressure, pulse, and                            oxygen saturations were monitored  continuously. The                            CF [id number] #[id number] was introduced through the anus                            and advanced to the cecum, identified by                            appendiceal orifice and ileocecal valve. The                            colonoscopy was performed without difficulty. The                            patient tolerated the procedure well. The quality                            of the bowel preparation was inadequate. The                            ileocecal valve, appendiceal orifice, and rectum                            were photographed. Scope In: 2:05:54 PM Scope Out: 2:27:15 PM Scope Withdrawal Time: 0 hours 16 minutes 48 seconds  Total Procedure Duration: 0 hours 21 minutes 21 seconds  Findings:                 The digital rectal exam was normal.                           A 6 mm polyp was found in the ascending colon. The                            polyp was sessile. The polyp was removed with a                            cold snare. Resection and retrieval were complete.  A 5 mm polyp was found in the transverse colon. The                            polyp was sessile. The polyp was removed with a                            cold snare. Resection and retrieval were complete.                           Internal hemorrhoids were found during                            retroflexion. The hemorrhoids were small. Complications:            No immediate complications. Estimated Blood Loss:     Estimated blood loss: none. Impression:               - Preparation of the colon was inadequate.                           - One 6 mm polyp in the ascending colon, removed                            with a cold snare. Resected and retrieved.                           - One 5 mm polyp in the transverse colon, removed                            with a cold snare. Resected and retrieved.                           - Internal  hemorrhoids. Recommendation:           - Patient has a contact number available for                            emergencies. The signs and symptoms of potential                            delayed complications were discussed with the                            patient. Return to normal activities tomorrow.                            Written discharge instructions were provided to the                            patient.                           - Resume previous diet.                           -  Continue present medications.                           - Await pathology results.                           - Repeat colonoscopy within 6 months because the                            bowel preparation was suboptimal. 2 day bowel prep! Gordy CHRISTELLA Starch, MD 12/27/2023 2:30:40 PM This report has been signed electronically.

## 2023-12-30 ENCOUNTER — Telehealth: Payer: Self-pay | Admitting: *Deleted

## 2023-12-30 NOTE — Telephone Encounter (Signed)
  Follow up Call-     12/27/2023    1:02 PM  Call back number  Post procedure Call Back phone  # [phone]  Permission to leave phone message Yes     Patient questions:  Do you have a fever, pain , or abdominal swelling? No. Pain Score  0 *  Have you tolerated food without any problems? Yes.    Have you been able to return to your normal activities? Yes.    Do you have any questions about your discharge instructions: Diet   No. Medications  No. Follow up visit  No.  Do you have questions or concerns about your Care? No.  Actions: * If pain score is 4 or above: No action needed, pain <4.

## 2024-01-01 LAB — SURGICAL PATHOLOGY

## 2024-01-03 ENCOUNTER — Encounter: Payer: Self-pay | Admitting: Internal Medicine

## 2024-03-17 ENCOUNTER — Telehealth: Payer: Self-pay

## 2024-03-17 NOTE — Telephone Encounter (Signed)
*  Pulm  Pharmacy Patient Advocate Encounter   Received notification from CoverMyMeds that prior authorization for Trelegy Ellipta 200-62.5-25MCG/ACT aerosol powder  is required/requested.   Insurance verification completed.   The patient is insured through [facility] .   Per test claim: PA required; However, NEW/RECENT labs/notes are needed to complete & submit PA request. Please see below.  CMM Key:  [id number]

## 2024-03-17 NOTE — Telephone Encounter (Signed)
 We have not seen him since 03/18/23 and he was a patient of Dr. 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  He would need an office visit to get further refills.

## 2024-03-30 ENCOUNTER — Emergency Department (HOSPITAL_COMMUNITY)

## 2024-03-30 ENCOUNTER — Encounter (HOSPITAL_COMMUNITY): Payer: Self-pay

## 2024-03-30 ENCOUNTER — Emergency Department (HOSPITAL_COMMUNITY)
Admission: EM | Admit: 2024-03-30 | Discharge: 2024-03-30 | Disposition: A | Discharge status: Home / Self Care | Attending: Emergency Medicine | Admitting: Emergency Medicine

## 2024-03-30 ENCOUNTER — Other Ambulatory Visit: Payer: Self-pay

## 2024-03-30 DIAGNOSIS — R5383 Other fatigue: Secondary | ICD-10-CM | POA: Diagnosis not present

## 2024-03-30 DIAGNOSIS — Z7901 Long term (current) use of anticoagulants: Secondary | ICD-10-CM | POA: Insufficient documentation

## 2024-03-30 DIAGNOSIS — R110 Nausea: Secondary | ICD-10-CM | POA: Insufficient documentation

## 2024-03-30 DIAGNOSIS — R0602 Shortness of breath: Secondary | ICD-10-CM | POA: Diagnosis not present

## 2024-03-30 DIAGNOSIS — Z794 Long term (current) use of insulin: Secondary | ICD-10-CM | POA: Diagnosis not present

## 2024-03-30 DIAGNOSIS — R5381 Other malaise: Secondary | ICD-10-CM | POA: Insufficient documentation

## 2024-03-30 LAB — COMPREHENSIVE METABOLIC PANEL WITH GFR
ALT: 27 U/L (ref 0–44)
AST: 23 U/L (ref 15–41)
Albumin: 4.2 g/dL (ref 3.5–5.0)
Alkaline Phosphatase: 81 U/L (ref 38–126)
Anion gap: 10 (ref 5–15)
BUN: 16 mg/dL (ref 8–23)
CO2: 26 mmol/L (ref 22–32)
Calcium: 9.6 mg/dL (ref 8.9–10.3)
Chloride: 104 mmol/L (ref 98–111)
Creatinine, Ser: 1.27 mg/dL — ABNORMAL HIGH (ref 0.61–1.24)
GFR, Estimated: >60 mL/min (ref >60)
Glucose, Bld: 93 mg/dL (ref 70–99)
Potassium: 4.1 mmol/L (ref 3.5–5.1)
Sodium: 140 mmol/L (ref 135–145)
Total Bilirubin: 0.7 mg/dL (ref 0.0–1.2)
Total Protein: 8.1 g/dL (ref 6.5–8.1)

## 2024-03-30 LAB — CBC WITH DIFFERENTIAL/PLATELET
Abs Immature Granulocytes: 0.02 10*3/uL (ref 0.00–0.07)
Basophils Absolute: 0 10*3/uL (ref 0.0–0.1)
Basophils Relative: 0 %
Eosinophils Absolute: 0.2 10*3/uL (ref 0.0–0.5)
Eosinophils Relative: 3 %
HCT: 46.6 % (ref 39.0–52.0)
Hemoglobin: 14.4 g/dL (ref 13.0–17.0)
Immature Granulocytes: 0 %
Lymphocytes Relative: 31 %
Lymphs Abs: 2.1 10*3/uL (ref 0.7–4.0)
MCH: 25.1 pg — ABNORMAL LOW (ref 26.0–34.0)
MCHC: 30.9 g/dL (ref 30.0–36.0)
MCV: 81.2 fL (ref 80.0–100.0)
Monocytes Absolute: 0.7 10*3/uL (ref 0.1–1.0)
Monocytes Relative: 11 %
Neutro Abs: 3.7 10*3/uL (ref 1.7–7.7)
Neutrophils Relative %: 55 %
Platelets: 192 10*3/uL (ref 150–400)
RBC: 5.74 MIL/uL (ref 4.22–5.81)
RDW: 14 % (ref 11.5–15.5)
WBC: 6.7 10*3/uL (ref 4.0–10.5)
nRBC: 0 % (ref 0.0–0.2)

## 2024-03-30 LAB — BRAIN NATRIURETIC PEPTIDE: B Natriuretic Peptide: 20.1 pg/mL (ref 0.0–100.0)

## 2024-03-30 LAB — LIPASE, BLOOD: Lipase: 32 U/L (ref 11–51)

## 2024-03-30 LAB — RESP PANEL BY RT-PCR (RSV, FLU A&B, COVID)  RVPGX2
Influenza A by PCR: NEGATIVE
Influenza B by PCR: NEGATIVE
Resp Syncytial Virus by PCR: NEGATIVE
SARS Coronavirus 2 by RT PCR: NEGATIVE

## 2024-03-30 LAB — TROPONIN I (HIGH SENSITIVITY): Troponin I (High Sensitivity): 3 ng/L (ref <18)

## 2024-03-30 MED ORDER — ONDANSETRON 4 MG PO TBDP: 4.0000 mg | ORAL_TABLET | Freq: Three times a day (TID) | ORAL | 0 refills | Status: DC | PRN

## 2024-03-30 MED ORDER — ONDANSETRON 4 MG PO TBDP: 4.0000 mg | ORAL_TABLET | Freq: Three times a day (TID) | ORAL | 0 refills | Status: AC | PRN

## 2024-03-30 NOTE — ED Triage Notes (Signed)
 Pt arrived reporting nausea after pneumonia shot last week. States hx of neuropathy and aches all over always. No other symptoms reported.

## 2024-03-30 NOTE — ED Provider Notes (Signed)
 [name] EMERGENCY DEPARTMENT AT [facility] Provider Note   CSN: [id number] Arrival date & time: 03/30/24  0809     History  Chief Complaint  Patient presents with   Nausea    Kenneth Davila is a 68 y.o. male.  HPI The patient was for about a week he has had general nausea and fatigue and low energy.  He reports it feels similar to when he got diagnosed with pneumonia.  He reports he does not have significant shortness of breath but might notice some with exertion.  He reports he does have a history of emphysema.  Patient quit smoking in 2017.  No marijuana use.  He reports he got a pneumonia shot about a week ago and was not sure if this was residual symptoms from that.  He reports he has had low but no abdominal pain.  No lower extremity swelling or calf pain.    Home Medications Prior to Admission medications   Medication Sig Start Date End Date Taking? Authorizing Provider  buprenorphine (BUTRANS) 5 MCG/HR PTWK Place 1 patch onto the skin once a week. 10/28/23   [provider]  cetirizine (ZYRTEC) 10 MG tablet Take 10 mg by mouth daily. 09/21/22   [provider]  famotidine  (PEPCID ) 20 MG tablet Take 20 mg by mouth every morning. 10/13/23   [provider]  fluticasone (FLONASE) 50 MCG/ACT nasal spray Place 1 spray into both nostrils as needed. 08/23/22   [provider]  Fluticasone-Umeclidin-Vilant (TRELEGY ELLIPTA ) 200-62.5-25 MCG/ACT AEPB Inhale 1 puff into the lungs daily. 03/18/23   Gloriajean Large, MD  Ibuprofen -Acetaminophen  (ADVIL  DUAL ACTION) 125-250 MG TABS Take 2 tablets by mouth 2 (two) times daily as needed (pain).    [provider]  losartan  (COZAAR ) 50 MG tablet Take 50 mg by mouth daily. 08/21/20   [provider]  meloxicam  (MOBIC ) 15 MG tablet Take 15 mg by mouth daily. 11/18/22   [provider]  metFORMIN (GLUCOPHAGE) 500 MG tablet Take 500 mg by mouth daily. 10/13/23   [provider]  Misc Natural Products (PROSTATE CONTROL PO) Take 1 capsule by mouth daily.    [provider]  nystatin  (MYCOSTATIN ) 100000 UNIT/ML suspension Take 5 mLs (500,000 Units total) by mouth 4 (four) times daily. Patient not taking: Reported on 12/27/2023 06/25/22   Gloriajean Large, MD  oxyCODONE  (ROXICODONE ) 15 MG immediate release tablet Take 15 mg by mouth 3 (three) times daily. 02/28/23   [provider]  OZEMPIC, 0.25 OR 0.5 MG/DOSE, 2 MG/3ML SOPN Inject into the skin. 10/31/23   [provider]  pregabalin  (LYRICA ) 150 MG capsule Take 150 mg by mouth daily.    [provider]  pregabalin  (LYRICA ) 75 MG capsule Take 75 mg by mouth 3 x daily with food.    [provider]  sildenafil (VIAGRA) 100 MG tablet Take 100 mg by mouth daily as needed. 11/10/23   [provider]  tiZANidine (ZANAFLEX) 4 MG tablet Take 2-4 mg by mouth 2 (two) times daily as needed. 11/09/23   [provider]  apixaban  (ELIQUIS ) 5 MG TABS tablet Take 2 tablets (10mg ) twice daily for 7 days, then 1 tablet (5mg ) twice daily 12/28/19 07/22/20  Vada Garibaldi, MD      Allergies    Amlodipine     Review of Systems   Review of Systems  Physical Exam Updated Vital Signs BP 117/71   Pulse 80   Temp 98.6 F (  37 C) (Oral)   Resp 17   Ht 6\' 2"  (1.88 m)   SpO2 97%   BMI 45.09 kg/m  Physical Exam Constitutional:      Comments: Patient is alert nontoxic.  No respiratory distress at rest.  Mental status was clear.  HENT:     Mouth/Throat:     Pharynx: Oropharynx is clear.  Eyes:     Extraocular Movements: Extraocular movements intact.  Cardiovascular:     Rate and Rhythm: Normal rate and regular rhythm.  Pulmonary:     Effort: Pulmonary effort is normal.     Breath sounds: Normal breath sounds.  Abdominal:     General: There is no distension.     Palpations: Abdomen is soft.     Tenderness: There is no abdominal tenderness.  Musculoskeletal:         General: No swelling or tenderness. Normal range of motion.     Right lower leg: No edema.     Left lower leg: No edema.  Skin:    General: Skin is warm and dry.  Neurological:     General: No focal deficit present.     Mental Status: He is oriented to person, place, and time.     Motor: No weakness.     Coordination: Coordination normal.  Psychiatric:        Mood and Affect: Mood normal.     ED Results / Procedures / Treatments   Labs (all labs ordered are listed, but only abnormal results are displayed) Labs Reviewed - No data to display  EKG EKG Interpretation Date/Time:  Monday March 30 2024 15:35:57 EDT Ventricular Rate:  75 PR Interval:  182 QRS Duration:  98 QT Interval:  399 QTC Calculation: 446 R Axis:   58  Text Interpretation: Sinus rhythm No significant change since last tracing Confirmed by Scarlette Currier ([id number]) on 03/30/2024 6:29:17 PM  Radiology No results found.  Procedures Procedures    Medications Ordered in ED Medications - No data to display  ED Course/ Medical Decision Making/ A&P                                 Medical Decision Making Amount and/or Complexity of Data Reviewed Labs: ordered. Radiology: ordered.  Risk Prescription drug management.   Patient presents with symptoms of general nausea and fatigue with concern for possible pneumonia.  Patient does not have any hypoxia.  No fever.  At this time we will proceed with diagnostic evaluation for pneumonia\ACS\CHF\metabolic derangement.  Schlossman assumed care for follow-up on diagnostic results.  At this time if results are within normal limits or patient baseline, I do feel he will be stable for discharge.  Clinically he is well in appearance.  Vital signs are stable.        Final Clinical Impression(s) / ED Diagnoses Final diagnoses:  Nausea  Malaise and fatigue    Rx / DC Orders ED Discharge Orders     None         Wynetta Heckle, MD 04/04/24  1910

## 2024-04-01 NOTE — ED Provider Notes (Signed)
 Received care of patient from Dr. Daivd Dub. Presented with nausea after receiving flu shot. No other significant symptoms to suggest intracrania, intraabdominal emergency. Labs without signs of clinically significant electrolyte abnormalities, no pancreatitis, no hepatitis, no sign of CHF, ACS, nor anemia.  COVID/flu?RSV negative. CXR without acute disease. At this time do not see emergent pathology and recommend continued outpatient follow up of symptoms. Consider viral etiology or gastritis. Given rx for zofran. Recommend follow up with PCP.    Scarlette Currier, MD 04/01/24 1252

## 2024-05-06 ENCOUNTER — Ambulatory Visit: Admitting: Physician Assistant
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
## 2024-08-18 ENCOUNTER — Ambulatory Visit
Admission: RE | Admit: 2024-08-18 | Discharge: 2024-08-18 | Disposition: A | Discharge status: Home / Self Care | Source: Ambulatory Visit | Attending: Acute Care | Admitting: Acute Care

## 2024-08-18 DIAGNOSIS — Z122 Encounter for screening for malignant neoplasm of respiratory organs: Secondary | ICD-10-CM

## 2024-08-18 DIAGNOSIS — Z87891 Personal history of nicotine dependence: Secondary | ICD-10-CM

## 2024-08-26 ENCOUNTER — Other Ambulatory Visit: Payer: Self-pay | Admitting: Acute Care

## 2024-08-26 DIAGNOSIS — Z87891 Personal history of nicotine dependence: Secondary | ICD-10-CM

## 2024-08-26 DIAGNOSIS — Z122 Encounter for screening for malignant neoplasm of respiratory organs: Secondary | ICD-10-CM

## 2024-09-06 ENCOUNTER — Emergency Department (HOSPITAL_COMMUNITY)
Admission: EM | Admit: 2024-09-06 | Discharge: 2024-09-06 | Disposition: A | Discharge status: Home / Self Care | Attending: Emergency Medicine | Admitting: Emergency Medicine

## 2024-09-06 ENCOUNTER — Encounter (HOSPITAL_COMMUNITY): Payer: Self-pay

## 2024-09-06 ENCOUNTER — Emergency Department (HOSPITAL_COMMUNITY)
Admission: EM | Admit: 2024-09-06 | Discharge: 2024-09-06 | Disposition: A | Discharge status: Home / Self Care | Source: Home / Self Care | Attending: Emergency Medicine | Admitting: Emergency Medicine

## 2024-09-06 ENCOUNTER — Other Ambulatory Visit: Payer: Self-pay

## 2024-09-06 DIAGNOSIS — R110 Nausea: Secondary | ICD-10-CM | POA: Insufficient documentation

## 2024-09-06 DIAGNOSIS — Z7901 Long term (current) use of anticoagulants: Secondary | ICD-10-CM | POA: Insufficient documentation

## 2024-09-06 DIAGNOSIS — R112 Nausea with vomiting, unspecified: Secondary | ICD-10-CM | POA: Diagnosis present

## 2024-09-06 DIAGNOSIS — R1013 Epigastric pain: Secondary | ICD-10-CM | POA: Diagnosis not present

## 2024-09-06 DIAGNOSIS — Z794 Long term (current) use of insulin: Secondary | ICD-10-CM | POA: Insufficient documentation

## 2024-09-06 LAB — COMPREHENSIVE METABOLIC PANEL WITH GFR
ALT: 22 U/L (ref 0–44)
AST: 32 U/L (ref 15–41)
Albumin: 3.9 g/dL (ref 3.5–5.0)
Alkaline Phosphatase: 97 U/L (ref 38–126)
Anion gap: 11 (ref 5–15)
BUN: 9 mg/dL (ref 8–23)
CO2: 24 mmol/L (ref 22–32)
Calcium: 9.8 mg/dL (ref 8.9–10.3)
Chloride: 102 mmol/L (ref 98–111)
Creatinine, Ser: 1.33 mg/dL — ABNORMAL HIGH (ref 0.61–1.24)
GFR, Estimated: 59 mL/min — ABNORMAL LOW (ref >60)
Glucose, Bld: 104 mg/dL — ABNORMAL HIGH (ref 70–99)
Potassium: 4.2 mmol/L (ref 3.5–5.1)
Sodium: 137 mmol/L (ref 135–145)
Total Bilirubin: 1 mg/dL (ref 0.0–1.2)
Total Protein: 7.6 g/dL (ref 6.5–8.1)

## 2024-09-06 LAB — CBC WITH DIFFERENTIAL/PLATELET
Abs Immature Granulocytes: 0.01 K/uL (ref 0.00–0.07)
Basophils Absolute: 0 K/uL (ref 0.0–0.1)
Basophils Relative: 0 %
Eosinophils Absolute: 0.1 K/uL (ref 0.0–0.5)
Eosinophils Relative: 2 %
HCT: 48.2 % (ref 39.0–52.0)
Hemoglobin: 15.6 g/dL (ref 13.0–17.0)
Immature Granulocytes: 0 %
Lymphocytes Relative: 24 %
Lymphs Abs: 1.8 K/uL (ref 0.7–4.0)
MCH: 25.4 pg — ABNORMAL LOW (ref 26.0–34.0)
MCHC: 32.4 g/dL (ref 30.0–36.0)
MCV: 78.4 fL — ABNORMAL LOW (ref 80.0–100.0)
Monocytes Absolute: 0.7 K/uL (ref 0.1–1.0)
Monocytes Relative: 9 %
Neutro Abs: 4.9 K/uL (ref 1.7–7.7)
Neutrophils Relative %: 65 %
Platelets: 217 K/uL (ref 150–400)
RBC: 6.15 MIL/uL — ABNORMAL HIGH (ref 4.22–5.81)
RDW: 13.3 % (ref 11.5–15.5)
WBC: 7.6 K/uL (ref 4.0–10.5)
nRBC: 0 % (ref 0.0–0.2)

## 2024-09-06 LAB — I-STAT CHEM 8, ED
BUN: 8 mg/dL (ref 8–23)
Calcium, Ion: 1.29 mmol/L (ref 1.15–1.40)
Chloride: 104 mmol/L (ref 98–111)
Creatinine, Ser: 1.3 mg/dL — ABNORMAL HIGH (ref 0.61–1.24)
Glucose, Bld: 103 mg/dL — ABNORMAL HIGH (ref 70–99)
HCT: 48 % (ref 39.0–52.0)
Hemoglobin: 16.3 g/dL (ref 13.0–17.0)
Potassium: 3.8 mmol/L (ref 3.5–5.1)
Sodium: 141 mmol/L (ref 135–145)
TCO2: 24 mmol/L (ref 22–32)

## 2024-09-06 LAB — LIPASE, BLOOD: Lipase: 39 U/L (ref 11–51)

## 2024-09-06 MED ORDER — SUCRALFATE 1 GM/10ML PO SUSP
1.0000 g | Freq: Once | ORAL | Status: AC
  Administered 2024-09-06: 1 g via ORAL
  Filled 2024-09-06: qty 10

## 2024-09-06 MED ORDER — LORAZEPAM 2 MG/ML IJ SOLN
1.0000 mg | Freq: Once | INTRAMUSCULAR | Status: AC
  Administered 2024-09-06: 1 mg via INTRAMUSCULAR
  Filled 2024-09-06: qty 1

## 2024-09-06 MED ORDER — ONDANSETRON HCL 4 MG/2ML IJ SOLN
4.0000 mg | Freq: Once | INTRAMUSCULAR | Status: AC
  Administered 2024-09-06: 4 mg via INTRAVENOUS
  Filled 2024-09-06: qty 2

## 2024-09-06 MED ORDER — SODIUM CHLORIDE 0.9 % IV SOLN: INTRAVENOUS | Status: DC

## 2024-09-06 MED ORDER — FAMOTIDINE IN NACL 20-0.9 MG/50ML-% IV SOLN
20.0000 mg | Freq: Once | INTRAVENOUS | Status: AC
  Administered 2024-09-06: 20 mg via INTRAVENOUS
  Filled 2024-09-06: qty 50

## 2024-09-06 MED ORDER — METOCLOPRAMIDE HCL 10 MG PO TABS: 10.0000 mg | ORAL_TABLET | Freq: Three times a day (TID) | ORAL | 0 refills | Status: AC | PRN

## 2024-09-06 NOTE — ED Provider Notes (Signed)
 [place] EMERGENCY DEPARTMENT AT [facility] Provider Note   CSN: [id number] Arrival date & time: 09/06/24  1156     Patient presents with: Nausea   Kenneth Davila is a 68 y.o. male.   HPI Patient presents same-day is discharged after I saw him earlier.  He notes that on discharge he continued to have nausea.  Though we had previously discussed the fact that nausea might take some time to improve, including new medication regimen and discussion of his chronic regimen with his physician, given his persistent nausea he checked in again for evaluation.  No new complaints.    Prior to Admission medications   Medication Sig Start Date End Date Taking? Authorizing Provider  buprenorphine (BUTRANS) 5 MCG/HR PTWK Place 1 patch onto the skin once a week. 10/28/23   [provider]  cetirizine (ZYRTEC) 10 MG tablet Take 10 mg by mouth daily. 09/21/22   [provider]  famotidine  (PEPCID ) 20 MG tablet Take 20 mg by mouth every morning. 10/13/23   [provider]  fluticasone (FLONASE) 50 MCG/ACT nasal spray Place 1 spray into both nostrils as needed. 08/23/22   [provider]  Fluticasone-Umeclidin-Vilant (TRELEGY ELLIPTA ) 200-62.5-25 MCG/ACT AEPB Inhale 1 puff into the lungs daily. 03/18/23   Gladis Leonor HERO, MD  Ibuprofen -Acetaminophen  (ADVIL  DUAL ACTION) 125-250 MG TABS Take 2 tablets by mouth 2 (two) times daily as needed (pain).    [provider]  losartan  (COZAAR ) 50 MG tablet Take 50 mg by mouth daily. 08/21/20   [provider]  meloxicam  (MOBIC ) 15 MG tablet Take 15 mg by mouth daily. 11/18/22   [provider]  metFORMIN (GLUCOPHAGE) 500 MG tablet Take 500 mg by mouth daily. 10/13/23   [provider]  metoCLOPramide  (REGLAN ) 10 MG tablet Take 1 tablet (10 mg total) by mouth every 8 (eight) hours as needed for nausea or vomiting. 09/06/24   Garrick Charleston, MD  Misc Natural Products (PROSTATE CONTROL PO)  Take 1 capsule by mouth daily.    [provider]  nystatin  (MYCOSTATIN ) 100000 UNIT/ML suspension Take 5 mLs (500,000 Units total) by mouth 4 (four) times daily. Patient not taking: Reported on 12/27/2023 06/25/22   Gladis Leonor HERO, MD  ondansetron  (ZOFRAN -ODT) 4 MG disintegrating tablet Take 1 tablet (4 mg total) by mouth every 8 (eight) hours as needed for nausea or vomiting. 03/30/24   Dreama Longs, MD  oxyCODONE  (ROXICODONE ) 15 MG immediate release tablet Take 15 mg by mouth 3 (three) times daily. 02/28/23   [provider]  OZEMPIC, 0.25 OR 0.5 MG/DOSE, 2 MG/3ML SOPN Inject into the skin. 10/31/23   [provider]  pregabalin  (LYRICA ) 150 MG capsule Take 150 mg by mouth daily.    [provider]  pregabalin  (LYRICA ) 75 MG capsule Take 75 mg by mouth 3 x daily with food.    [provider]  sildenafil (VIAGRA) 100 MG tablet Take 100 mg by mouth daily as needed. 11/10/23   [provider]  tiZANidine (ZANAFLEX) 4 MG tablet Take 2-4 mg by mouth 2 (two) times daily as needed. 11/09/23   [provider]  apixaban  (ELIQUIS ) 5 MG TABS tablet Take 2 tablets (10mg ) twice daily for 7 days, then 1 tablet (5mg ) twice daily 12/28/19 07/22/20  Raenelle Coria, MD    Allergies: Amlodipine     Review of Systems  Updated Vital Signs BP 131/79   Pulse 79   Temp 98.4 F (36.9 C)   Resp  18   SpO2 99%   Physical Exam Vitals and nursing note reviewed.  Constitutional:      General: He is not in acute distress.    Appearance: He is well-developed. He is obese. He is not ill-appearing or diaphoretic.  HENT:     Head: Normocephalic and atraumatic.  Eyes:     Conjunctiva/sclera: Conjunctivae normal.  Cardiovascular:     Rate and Rhythm: Normal rate and regular rhythm.  Pulmonary:     Effort: Pulmonary effort is normal. No respiratory distress.     Breath sounds: No stridor.  Abdominal:     General: There is no distension.      Palpations: There is no mass.  Skin:    General: Skin is warm and dry.  Neurological:     Mental Status: He is alert and oriented to person, place, and time.     (all labs ordered are listed, but only abnormal results are displayed) Labs Reviewed - No data to display  EKG: None  Radiology: No results found.   Procedures   Medications Ordered in the ED  LORazepam  (ATIVAN ) injection 1 mg (has no administration in time range)                                    Medical Decision Making Patient presents same day as prior evaluation for nausea, essentially no abdominal pain, with no findings consistent with peritonitis, bacteremia, sepsis, distress, obstruction.  With persistent nausea patient received intramuscular Ativan , was tolerant of oral fluids, remained hemodynamically stable for an additional period of monitoring, and has on prior evaluation, considerations include intrinsic nausea, vomiting, medication effects, particular given the patient is on multiple narcotics daily. Without evidence for distress, bacteremia, sepsis, obstruction, patient will follow-up with primary care.  Amount and/or Complexity of Data Reviewed External Data Reviewed: notes.    Details: My note from earlier today Labs:  Decision-making details documented in ED Course.    Details: Labs from earlier today reviewed  Risk Prescription drug management. Decision regarding hospitalization. Diagnosis or treatment significantly limited by social determinants of health.   Update: Patient in no distress, tolerating liquids, will follow-up with primary care.  Final diagnoses:  Nausea    ED Discharge Orders     None          Garrick Charleston, MD 09/06/24 1431

## 2024-09-06 NOTE — Discharge Instructions (Signed)
 Be sure to obtain and take your recently prescribed medication and follow-up with your physician.  Do not hesitate to return here for concerning changes in your condition.

## 2024-09-06 NOTE — ED Triage Notes (Signed)
 Pt reports emesis and nausea for the last three days. Pt reports some mild abdominal pain as well.

## 2024-09-06 NOTE — ED Triage Notes (Signed)
 Pt bib pov c/o nausea. Pt states he was just discharged. Pt says he took Reglan  before leaving but still feel nauseous.

## 2024-09-06 NOTE — Discharge Instructions (Addendum)
 As discussed, your evaluation today has been largely reassuring.  But, it is important that you monitor your condition carefully, and do not hesitate to return to the ED if you develop new, or concerning changes in your condition.  Otherwise, please follow-up with your physician for appropriate ongoing care.  Please be sure to discuss today's visit, your ongoing medication regimen and your new antinausea medications starting today.

## 2024-09-06 NOTE — ED Provider Notes (Signed)
 [facility] AT [facility] Provider Note   CSN: [id number] Arrival date & time: 09/06/24  9295     Patient presents with: Emesis   Kenneth Davila is a 68 y.o. male.   HPI Adult male presents with nausea, vomiting.  Only with additional question is patient notes some focal discomfort in his abdomen, epigastric.  Otherwise patient notes that he has had multiple episodes of nausea, vomiting, over the past few years, occasionally, including twice earlier this year.  No obvious precipitant.  Over the past 3 days patient has had persistent anorexia, nausea, multiples with the vomiting.  No relief with anything.     Prior to Admission medications   Medication Sig Start Date End Date Taking? Authorizing Provider  metoCLOPramide  (REGLAN ) 10 MG tablet Take 1 tablet (10 mg total) by mouth every 8 (eight) hours as needed for nausea or vomiting. 09/06/24  Yes Garrick Charleston, MD  buprenorphine (BUTRANS) 5 MCG/HR PTWK Place 1 patch onto the skin once a week. 10/28/23   [provider]  cetirizine (ZYRTEC) 10 MG tablet Take 10 mg by mouth daily. 09/21/22   [provider]  famotidine  (PEPCID ) 20 MG tablet Take 20 mg by mouth every morning. 10/13/23   [provider]  fluticasone (FLONASE) 50 MCG/ACT nasal spray Place 1 spray into both nostrils as needed. 08/23/22   [provider]  Fluticasone-Umeclidin-Vilant (TRELEGY ELLIPTA ) 200-62.5-25 MCG/ACT AEPB Inhale 1 puff into the lungs daily. 03/18/23   Gladis Leonor HERO, MD  Ibuprofen -Acetaminophen  (ADVIL  DUAL ACTION) 125-250 MG TABS Take 2 tablets by mouth 2 (two) times daily as needed (pain).    [provider]  losartan  (COZAAR ) 50 MG tablet Take 50 mg by mouth daily. 08/21/20   [provider]  meloxicam  (MOBIC ) 15 MG tablet Take 15 mg by mouth daily. 11/18/22   [provider]  metFORMIN (GLUCOPHAGE) 500 MG tablet Take 500 mg by mouth daily. 10/13/23   [provider]  Misc Natural Products (PROSTATE CONTROL PO) Take 1 capsule by mouth daily.    [provider]  nystatin  (MYCOSTATIN ) 100000 UNIT/ML suspension Take 5 mLs (500,000 Units total) by mouth 4 (four) times daily. Patient not taking: Reported on 12/27/2023 06/25/22   Gladis Leonor HERO, MD  ondansetron  (ZOFRAN -ODT) 4 MG disintegrating tablet Take 1 tablet (4 mg total) by mouth every 8 (eight) hours as needed for nausea or vomiting. 03/30/24   Dreama Longs, MD  oxyCODONE  (ROXICODONE ) 15 MG immediate release tablet Take 15 mg by mouth 3 (three) times daily. 02/28/23   [provider]  OZEMPIC, 0.25 OR 0.5 MG/DOSE, 2 MG/3ML SOPN Inject into the skin. 10/31/23   [provider]  pregabalin  (LYRICA ) 150 MG capsule Take 150 mg by mouth daily.    [provider]  pregabalin  (LYRICA ) 75 MG capsule Take 75 mg by mouth 3 x daily with food.    [provider]  sildenafil (VIAGRA) 100 MG tablet Take 100 mg by mouth daily as needed. 11/10/23   [provider]  tiZANidine (ZANAFLEX) 4 MG tablet Take 2-4 mg by mouth 2 (two) times daily as needed. 11/09/23   [provider]  apixaban  (ELIQUIS ) 5 MG TABS tablet Take 2 tablets (10mg ) twice daily for 7 days, then 1 tablet (5mg ) twice daily 12/28/19 07/22/20  Raenelle Coria, MD    Allergies: Amlodipine     Review of Systems  Updated Vital Signs BP (!) 128/59   Pulse 72  Temp 98 F (36.7 C)   Resp 18   SpO2 92%   Physical Exam Vitals and nursing note reviewed.  Constitutional:      General: He is not in acute distress.    Appearance: He is well-developed. He is obese. He is not ill-appearing or diaphoretic.  HENT:     Head: Normocephalic and atraumatic.  Eyes:     Conjunctiva/sclera: Conjunctivae normal.  Cardiovascular:     Rate and Rhythm: Normal rate and regular rhythm.  Pulmonary:     Effort: Pulmonary effort is normal. No respiratory distress.     Breath sounds: No stridor.   Abdominal:     General: There is no distension.     Palpations: There is no mass.  Skin:    General: Skin is warm and dry.  Neurological:     Mental Status: He is alert and oriented to person, place, and time.     (all labs ordered are listed, but only abnormal results are displayed) Labs Reviewed  COMPREHENSIVE METABOLIC PANEL WITH GFR - Abnormal; Notable for the following components:      Result Value   Glucose, Bld 104 (*)    Creatinine, Ser 1.33 (*)    GFR, Estimated 59 (*)    All other components within normal limits  CBC WITH DIFFERENTIAL/PLATELET - Abnormal; Notable for the following components:   RBC 6.15 (*)    MCV 78.4 (*)    MCH 25.4 (*)    All other components within normal limits  I-STAT CHEM 8, ED - Abnormal; Notable for the following components:   Creatinine, Ser 1.30 (*)    Glucose, Bld 103 (*)    All other components within normal limits  LIPASE, BLOOD    EKG: None  Radiology: No results found.   Procedures   Medications Ordered in the ED  0.9 %  sodium chloride  infusion ( Intravenous New Bag/Given 09/06/24 0757)  ondansetron  (ZOFRAN ) injection 4 mg (4 mg Intravenous Given 09/06/24 0758)  famotidine  (PEPCID ) IVPB 20 mg premix (0 mg Intravenous Stopped 09/06/24 0923)  sucralfate  (CARAFATE ) 1 GM/10ML suspension 1 g (1 g Oral Given 09/06/24 0756)                                    Medical Decision Making With a history of multiple abdominal surgeries, baseline chronic pain on narcotics presents with nausea, vomiting.  Patient's abdomen is not peritonitic, does have minimal pain in the epigastrium, suggesting esophogastric etiology, though with considerations of ileus secondary to opiates, infection, other etiology, patient had labs while receiving fluids, H2 blocker, Carafate . No early evidence for atypical ACS, with symptoms for 3 days without progression, no chest pain, no dyspnea, and no hemodynamic instability. Cardiac 75 sinus normal pulse ox 100%  room air normal  Amount and/or Complexity of Data Reviewed External Data Reviewed: notes.    Details: Similar ED presentation about 1 year ago Labs: ordered. Decision-making details documented in ED Course.  Risk Prescription drug management. Decision regarding hospitalization. Diagnosis or treatment significantly limited by social determinants of health.   10:00 AM Patient sleeping, awakens easily he is hemodynamically unremarkable.  Abdomen remains soft, we discussed all findings at length, reassuring, similar with prior, with unremarkable vitals, some consideration of medication effects versus gastritis, though the patient has more nausea, then pain.  Patient will start new nausea medication will follow-up with primary care in the coming days.  No evidence for other acute phenomena including hepatobiliary dysfunction, pancreatitis, patient's renal dysfunction remains similar to prior studies.     Final diagnoses:  Nausea and vomiting, unspecified vomiting type    ED Discharge Orders          Ordered    metoCLOPramide  (REGLAN ) 10 MG tablet  Every 8 hours PRN        09/06/24 1000               Garrick Charleston, MD 09/06/24 1000
